# Patient Record
Sex: Female | Born: 1965 | Race: Black or African American | Hispanic: No | Marital: Married | Smoking: Never smoker
Health system: Southern US, Community
[De-identification: ages and names within clinical notes are randomized; demographics above are authoritative.]

## PROBLEM LIST (undated history)

## (undated) DIAGNOSIS — N926 Irregular menstruation, unspecified: Secondary | ICD-10-CM

## (undated) DIAGNOSIS — R0602 Shortness of breath: Secondary | ICD-10-CM

## (undated) DIAGNOSIS — M2550 Pain in unspecified joint: Secondary | ICD-10-CM

## (undated) DIAGNOSIS — K760 Fatty (change of) liver, not elsewhere classified: Secondary | ICD-10-CM

## (undated) DIAGNOSIS — M549 Dorsalgia, unspecified: Secondary | ICD-10-CM

## (undated) DIAGNOSIS — Z973 Presence of spectacles and contact lenses: Secondary | ICD-10-CM

## (undated) DIAGNOSIS — E119 Type 2 diabetes mellitus without complications: Secondary | ICD-10-CM

## (undated) DIAGNOSIS — Z86018 Personal history of other benign neoplasm: Secondary | ICD-10-CM

## (undated) DIAGNOSIS — I10 Essential (primary) hypertension: Secondary | ICD-10-CM

## (undated) DIAGNOSIS — R7303 Prediabetes: Secondary | ICD-10-CM

## (undated) DIAGNOSIS — E559 Vitamin D deficiency, unspecified: Secondary | ICD-10-CM

## (undated) DIAGNOSIS — R12 Heartburn: Secondary | ICD-10-CM

## (undated) DIAGNOSIS — G4730 Sleep apnea, unspecified: Secondary | ICD-10-CM

## (undated) DIAGNOSIS — R9389 Abnormal findings on diagnostic imaging of other specified body structures: Secondary | ICD-10-CM

## (undated) HISTORY — DX: Heartburn: R12

## (undated) HISTORY — PX: TONSILLECTOMY: SUR1361

## (undated) HISTORY — DX: Sleep apnea, unspecified: G47.30

## (undated) HISTORY — DX: Pain in unspecified joint: M25.50

## (undated) HISTORY — DX: Fatty (change of) liver, not elsewhere classified: K76.0

## (undated) HISTORY — DX: Shortness of breath: R06.02

## (undated) HISTORY — DX: Type 2 diabetes mellitus without complications: E11.9

## (undated) HISTORY — PX: CERVICAL CERCLAGE: SHX1329

## (undated) HISTORY — DX: Dorsalgia, unspecified: M54.9

---

## 1997-11-01 ENCOUNTER — Inpatient Hospital Stay (HOSPITAL_COMMUNITY): Admission: AD | Admit: 1997-11-01 | Discharge: 1997-11-01 | Payer: Self-pay | Admitting: Obstetrics and Gynecology

## 1998-03-24 ENCOUNTER — Other Ambulatory Visit: Admission: RE | Admit: 1998-03-24 | Discharge: 1998-03-24 | Payer: Self-pay | Admitting: Obstetrics & Gynecology

## 1998-05-06 ENCOUNTER — Ambulatory Visit (HOSPITAL_COMMUNITY): Admission: RE | Admit: 1998-05-06 | Discharge: 1998-05-06 | Payer: Self-pay | Admitting: Obstetrics and Gynecology

## 1998-05-06 ENCOUNTER — Encounter: Payer: Self-pay | Admitting: Obstetrics and Gynecology

## 1998-05-09 ENCOUNTER — Ambulatory Visit (HOSPITAL_COMMUNITY): Admission: RE | Admit: 1998-05-09 | Discharge: 1998-05-09 | Payer: Self-pay | Admitting: Obstetrics and Gynecology

## 1998-07-10 ENCOUNTER — Encounter: Payer: Self-pay | Admitting: Obstetrics and Gynecology

## 1998-07-10 ENCOUNTER — Ambulatory Visit (HOSPITAL_COMMUNITY): Admission: RE | Admit: 1998-07-10 | Discharge: 1998-07-10 | Payer: Self-pay | Admitting: Obstetrics and Gynecology

## 1998-09-10 ENCOUNTER — Inpatient Hospital Stay (HOSPITAL_COMMUNITY): Admission: AD | Admit: 1998-09-10 | Discharge: 1998-09-10 | Payer: Self-pay | Admitting: Obstetrics and Gynecology

## 1998-09-17 ENCOUNTER — Inpatient Hospital Stay (HOSPITAL_COMMUNITY): Admission: AD | Admit: 1998-09-17 | Discharge: 1998-09-17 | Payer: Self-pay | Admitting: Obstetrics and Gynecology

## 1998-09-25 ENCOUNTER — Encounter: Payer: Self-pay | Admitting: Obstetrics & Gynecology

## 1998-09-25 ENCOUNTER — Inpatient Hospital Stay (HOSPITAL_COMMUNITY): Admission: AD | Admit: 1998-09-25 | Discharge: 1998-09-25 | Payer: Self-pay | Admitting: Obstetrics & Gynecology

## 1998-10-12 ENCOUNTER — Inpatient Hospital Stay (HOSPITAL_COMMUNITY): Admission: AD | Admit: 1998-10-12 | Discharge: 1998-10-15 | Payer: Self-pay | Admitting: Obstetrics and Gynecology

## 1998-10-12 ENCOUNTER — Encounter (INDEPENDENT_AMBULATORY_CARE_PROVIDER_SITE_OTHER): Payer: Self-pay | Admitting: Specialist

## 1998-10-15 ENCOUNTER — Encounter (HOSPITAL_COMMUNITY): Admission: RE | Admit: 1998-10-15 | Discharge: 1999-01-13 | Payer: Self-pay | Admitting: Obstetrics and Gynecology

## 1998-12-16 ENCOUNTER — Encounter (HOSPITAL_COMMUNITY): Admission: RE | Admit: 1998-12-16 | Discharge: 1999-03-16 | Payer: Self-pay | Admitting: Obstetrics and Gynecology

## 1999-01-15 ENCOUNTER — Other Ambulatory Visit: Admission: RE | Admit: 1999-01-15 | Discharge: 1999-01-15 | Payer: Self-pay | Admitting: Obstetrics and Gynecology

## 1999-10-26 ENCOUNTER — Emergency Department (HOSPITAL_COMMUNITY): Admission: EM | Admit: 1999-10-26 | Discharge: 1999-10-26 | Payer: Self-pay | Admitting: Emergency Medicine

## 2000-08-08 ENCOUNTER — Encounter: Admission: RE | Admit: 2000-08-08 | Discharge: 2000-08-08 | Payer: Self-pay | Admitting: Emergency Medicine

## 2000-08-08 ENCOUNTER — Encounter: Payer: Self-pay | Admitting: Emergency Medicine

## 2001-03-01 ENCOUNTER — Encounter: Payer: Self-pay | Admitting: Obstetrics and Gynecology

## 2001-03-01 ENCOUNTER — Ambulatory Visit (HOSPITAL_COMMUNITY): Admission: RE | Admit: 2001-03-01 | Discharge: 2001-03-01 | Payer: Self-pay | Admitting: Obstetrics and Gynecology

## 2001-04-11 ENCOUNTER — Ambulatory Visit (HOSPITAL_COMMUNITY): Admission: RE | Admit: 2001-04-11 | Discharge: 2001-04-11 | Payer: Self-pay | Admitting: Obstetrics and Gynecology

## 2001-05-25 ENCOUNTER — Ambulatory Visit (HOSPITAL_COMMUNITY): Admission: RE | Admit: 2001-05-25 | Discharge: 2001-05-25 | Payer: Self-pay | Admitting: Obstetrics and Gynecology

## 2001-05-25 ENCOUNTER — Encounter: Payer: Self-pay | Admitting: Obstetrics and Gynecology

## 2001-08-02 ENCOUNTER — Encounter: Payer: Self-pay | Admitting: Obstetrics and Gynecology

## 2001-08-02 ENCOUNTER — Ambulatory Visit (HOSPITAL_COMMUNITY): Admission: RE | Admit: 2001-08-02 | Discharge: 2001-08-02 | Payer: Self-pay | Admitting: Obstetrics and Gynecology

## 2001-08-28 ENCOUNTER — Inpatient Hospital Stay (HOSPITAL_COMMUNITY): Admission: AD | Admit: 2001-08-28 | Discharge: 2001-08-28 | Payer: Self-pay | Admitting: Obstetrics and Gynecology

## 2001-08-31 ENCOUNTER — Inpatient Hospital Stay (HOSPITAL_COMMUNITY): Admission: AD | Admit: 2001-08-31 | Discharge: 2001-08-31 | Payer: Self-pay | Admitting: Obstetrics and Gynecology

## 2001-08-31 ENCOUNTER — Encounter: Payer: Self-pay | Admitting: Obstetrics and Gynecology

## 2001-09-05 ENCOUNTER — Encounter: Payer: Self-pay | Admitting: Obstetrics and Gynecology

## 2001-09-05 ENCOUNTER — Inpatient Hospital Stay (HOSPITAL_COMMUNITY): Admission: AD | Admit: 2001-09-05 | Discharge: 2001-09-05 | Payer: Self-pay | Admitting: Obstetrics and Gynecology

## 2001-09-07 ENCOUNTER — Inpatient Hospital Stay (HOSPITAL_COMMUNITY): Admission: AD | Admit: 2001-09-07 | Discharge: 2001-09-09 | Payer: Self-pay | Admitting: Obstetrics and Gynecology

## 2001-09-10 ENCOUNTER — Encounter: Admission: RE | Admit: 2001-09-10 | Discharge: 2001-10-10 | Payer: Self-pay | Admitting: Obstetrics and Gynecology

## 2001-09-13 ENCOUNTER — Inpatient Hospital Stay (HOSPITAL_COMMUNITY): Admission: AD | Admit: 2001-09-13 | Discharge: 2001-09-13 | Payer: Self-pay | Admitting: Obstetrics and Gynecology

## 2001-10-11 ENCOUNTER — Encounter: Admission: RE | Admit: 2001-10-11 | Discharge: 2001-11-10 | Payer: Self-pay | Admitting: Obstetrics and Gynecology

## 2001-12-12 ENCOUNTER — Other Ambulatory Visit: Admission: RE | Admit: 2001-12-12 | Discharge: 2001-12-12 | Payer: Self-pay | Admitting: Obstetrics and Gynecology

## 2003-03-25 ENCOUNTER — Other Ambulatory Visit: Admission: RE | Admit: 2003-03-25 | Discharge: 2003-03-25 | Payer: Self-pay | Admitting: Obstetrics and Gynecology

## 2003-04-11 ENCOUNTER — Ambulatory Visit (HOSPITAL_COMMUNITY): Admission: RE | Admit: 2003-04-11 | Discharge: 2003-04-11 | Payer: Self-pay | Admitting: Obstetrics and Gynecology

## 2003-04-18 ENCOUNTER — Encounter: Admission: RE | Admit: 2003-04-18 | Discharge: 2003-04-18 | Payer: Self-pay | Admitting: Obstetrics and Gynecology

## 2004-07-30 ENCOUNTER — Encounter: Admission: RE | Admit: 2004-07-30 | Discharge: 2004-07-30 | Payer: Self-pay | Admitting: Emergency Medicine

## 2004-11-04 ENCOUNTER — Other Ambulatory Visit: Admission: RE | Admit: 2004-11-04 | Discharge: 2004-11-04 | Payer: Self-pay | Admitting: Obstetrics and Gynecology

## 2005-06-02 ENCOUNTER — Ambulatory Visit (HOSPITAL_COMMUNITY): Admission: RE | Admit: 2005-06-02 | Discharge: 2005-06-02 | Payer: Self-pay | Admitting: General Surgery

## 2005-06-23 ENCOUNTER — Encounter: Admission: RE | Admit: 2005-06-23 | Discharge: 2005-06-23 | Payer: Self-pay | Admitting: General Surgery

## 2005-11-09 ENCOUNTER — Other Ambulatory Visit: Admission: RE | Admit: 2005-11-09 | Discharge: 2005-11-09 | Payer: Self-pay | Admitting: Obstetrics and Gynecology

## 2006-12-20 ENCOUNTER — Encounter: Admission: RE | Admit: 2006-12-20 | Discharge: 2006-12-20 | Payer: Self-pay | Admitting: Obstetrics and Gynecology

## 2009-03-20 ENCOUNTER — Encounter: Admission: RE | Admit: 2009-03-20 | Discharge: 2009-03-20 | Payer: Self-pay | Admitting: Internal Medicine

## 2010-02-01 ENCOUNTER — Encounter: Payer: Self-pay | Admitting: General Surgery

## 2010-04-01 ENCOUNTER — Ambulatory Visit (HOSPITAL_COMMUNITY)
Admission: RE | Admit: 2010-04-01 | Discharge: 2010-04-01 | Disposition: A | Discharge status: Home / Self Care | Payer: 59 | Source: Ambulatory Visit | Attending: Internal Medicine | Admitting: Internal Medicine

## 2010-04-01 ENCOUNTER — Other Ambulatory Visit: Payer: Self-pay | Admitting: Internal Medicine

## 2010-04-01 DIAGNOSIS — R52 Pain, unspecified: Secondary | ICD-10-CM

## 2010-04-01 DIAGNOSIS — R072 Precordial pain: Secondary | ICD-10-CM | POA: Insufficient documentation

## 2010-04-01 DIAGNOSIS — I10 Essential (primary) hypertension: Secondary | ICD-10-CM | POA: Insufficient documentation

## 2010-04-01 HISTORY — PX: TRANSTHORACIC ECHOCARDIOGRAM: SHX275

## 2010-05-29 NOTE — Op Note (Signed)
[facility]  PatientFAYOLA, MECKES Visit Number: [id number] MRN: [id number]          Service Type: Attending:  Maris Berger. Pennie Rushing, M.D. Dictated by:   Maris Berger. Pennie Rushing, M.D. Proc. Date: 04/11/01                             Operative Report  PREOPERATIVE DIAGNOSES:       Intrauterine pregnancy at 15 weeks, incompetent cervix.  POSTOPERATIVE DIAGNOSES:      Intrauterine pregnancy at 15 weeks, incompetent cervix.  OPERATION:                    Cervical cerclage.  ANESTHESIA:                   Spinal.  ESTIMATED BLOOD LOSS:         Less than 10 cc.  COMPLICATIONS:                None.  FINDINGS:                     The cervix was approximately 2.5-3 cm Cregger.  The external os was opened approximately 1 cm.  The internal os was at maximum a fingertip open.  PROCEDURE:                    The patient was taken to the operating room after appropriate identification and after a discussion of the indications, risks, and benefits of the procedure.  She was placed on the operating table and a spinal anesthetic was placed.  She was placed in the supine position with a left lateral tilt.  The perineum and vagina were prepped with multiple layers of Betadine, then draped as a sterile field.  A Red Robinson catheter was used to empty the bladder.  A weighted speculum was placed in the posterior vagina and ______ forceps placed on the anterior and posterior surfaces of the cervix.  The level of the internal os was identified.  Using a 5 mm Mersilene suture the cervix was then circumscribed in a purse string suture placed at the level of the internal os and tied anteriorly.  In tying the cervix was left at approximately a fingertip dilatation.  Hemostasis was noted to be adequate.  All instruments were removed from the vagina and the patient taken from the operating room to the recovery room in satisfactory condition having tolerated the procedure well  with sponge and instrument counts correct. Dictated by:   Maris Berger. Pennie Rushing, M.D. Attending:  Maris Berger. Pennie Rushing, M.D. DD:  04/11/01 TD:  04/12/01 Job: [id number]

## 2010-05-29 NOTE — H&P (Signed)
NAME:  Mary Odonnell, Mary Odonnell                           ACCOUNT NO.:  [id number]   MEDICAL RECORD NO.:  [id number]                   PATIENT TYPE:  MAT   LOCATION:                                       FACILITY:  [facility]   PHYSICIAN:  [id number]                                DATE OF BIRTH:  [birth year]   DATE OF ADMISSION:  09/07/2001  DATE OF DISCHARGE:                                HISTORY & PHYSICAL   HISTORY OF PRESENT ILLNESS:  The patient is a 45 year old gravida 3 para 1-0-  1-1 at 39 and six-sevenths weeks who presents for blood pressure check and  reevaluation secondary to hypertension and a cervical cerclage.  The patient  is complaining of a headache and mild blurry vision.  Pregnancy has been  remarkable for:  1. Chronic hypertension with the patient on Aldomet 500 mg t.i.d.  2. Incompetent cervix with cerclage in place.  3. Advanced maternal age with the patient declining amnio and AFP.  4. History of second-trimester SAB.  5. Fibroids.  6. History of abnormal Pap.  7. One abnormal value on three-hour GTT with normal fasting blood sugar and     two-hour PC last week.   PRENATAL LABORATORY DATA:  Blood type O positive, Rh antibody negative.  VDRL nonreactive.  Rubella titer positive.  Hepatitis B surface antigen  negative.  HIV nonreactive.  GC and chlamydia cultures negative.  Pap was  normal in November 2002.  Hemoglobin upon entering  the practice is 13.1; it  is 11.8 at 27 weeks.  EDC of October 06, 2001 was established by eight-  week ultrasound and was in agreement with ultrasound at approximately 18  weeks.  Group B strep culture has not yet been done.   HISTORY OF PRESENT PREGNANCY:  The patient entered care at approximately  nine weeks.  She was on Aldomet 250 mg b.i.d. at the beginning of her  pregnancy.  She had had a previous cerclage and was evaluated again for  cerclage.  Cerclage was placed on April 1.  At 15 weeks her cervix was Berhe  and closed and she was  continued on bedrest until April 14.  The patient and  her husband declined amnio and AFP.  The patient was placed on Zantac at 18  weeks for gastroesophageal reflux disease.  She was on Motrin during her  pregnancy in the early stages for cramping.  Aldomet was upped to 250 mg  t.i.d. at 22 weeks.  She had a Glucola that was done prior to checking her  urine.  At that time her urine was noted to have 3+ Glucose.  She  subsequently had a three-hour GTT.  She had one abnormal value.  The one-  hour value was 193.  She had a two-hour PC scheduled at 79,  36, and 40  weeks.  She had an ultrasound at 30 weeks that showed normal growth in the  50th to the 75th percentile.  Cervix was 2.4 cm with a cerclage in place.  Cervix was closed, 70%, vertex at a -2.  Cervix was checked at 32 weeks and  was closed, 70%, vertex at a -1, ballotable.  She had a normal blood sugar  at 33 weeks.  Aldomet was upped to 500 mg t.i.d. at 34 weeks.  She had PIH  labs done on August 18 and NSTs were begun twice weekly.  She has had a  tendency to have nonreactive NSTs over the last week.  The decision was made  to reevaluate her today for possible admission for induction secondary to  chronic hypertension.   OBSTETRICAL HISTORY:  In 1999 the patient had a 15-16 week spontaneous  miscarriage.  In 2000 she had the vaginal birth of a female infant, weight 6  pounds 11 ounces at [redacted] weeks gestation.  She was in labor 17 hours.  That  was a vacuum-assisted vaginal birth.  She had epidural anesthesia.  She did  have a cerclage placed during that pregnancy and remained on bedrest, and  cerclage was removed at approximately 36 weeks.   MEDICAL HISTORY:  She was on oral contraceptives in the past.  She had a  history of an abnormal Pap and a colposcopy with dysplasia.  She was  diagnosed with fibroids in 1998.  She was treated for chlamydia in the past.  She has occasional yeast infections.  She reports the usual childhood   illnesses.  She was diagnosed in 1994 with chronic hypertension and was on  Aldomet.  The patient has history of migraines but is not on any routine  medication.   SURGICAL HISTORY:  Includes a cerclage in 2000.  She had the spontaneous  miscarriage in October 1999.  She reports tonsillectomy and adenoidectomy as  a child.   ALLERGIES:  No known medication allergies.   GENETIC HISTORY:  Remarkable for the patient being 36 at her due date.  The  father of the baby has one parent with trait, and the father of the baby has  family history of twins.  Maternal grandmother has heart disease.  Maternal  grandmother also has chronic hypertension.  Maternal great-grandmother has  chronic hypertension and maternal grandmother has emphysema.   SOCIAL HISTORY:  The patient is married to the father of the baby.  He is  involved and supportive.  His name is Evee Liska.  The patient is [place]  American, of the [employer].  She is high-school educated.  She is  employed as an Systems developer.  Her husband is also high-school educated with some  Scientist, product/process development school and is employed also as an Systems developer.  She has been followed  by the physician service at [employer].  She denies any alcohol,  drug, or tobacco use during this pregnancy.   PHYSICAL EXAMINATION:  VITAL SIGNS:  Blood pressures 141 to 140 systolic  over 80-98 diastolic with a large cuff on the upper arm.  Other vital signs  are stable.  HEENT:  Within normal limits.  LUNGS:  Bilateral breath sounds are clear.  HEART:  Regular rate and rhythm without murmur.  BREASTS:  Soft and nontender.  ABDOMEN:  Fundal height is approximately 35 cm.  Uterine contractions are  irregular and mild.  Fetal heart rate baseline is in the 155s with sporadic  variables.  Deep tendon reflexes are 2+ without clonus.  There is trace to  1+ edema noted in the lower extremities.   IMPRESSION: 1. Intrauterine pregnancy at 35 and six-sevenths weeks.  2. Chronic  hypertension.  3. Nonreactive fetal heart rate.  4. Cerclage.   PLAN:  1. Admit to birthing suite per consult with Dr. Dierdre Forth as attending     physician.  2. Routine physician orders.  3. Dr. Pennie Rushing will plan to come remove the cerclage and determine method of     induction.  4. Clean catch urine will be done for protein evaluation.     Mary Odonnell, C.N.M.                   [id number]    VLL/MEDQ  D:  09/07/2001  T:  09/07/2001  Job:  [id number]

## 2011-11-23 ENCOUNTER — Other Ambulatory Visit: Payer: Self-pay | Admitting: Obstetrics and Gynecology

## 2011-11-23 DIAGNOSIS — Z1231 Encounter for screening mammogram for malignant neoplasm of breast: Secondary | ICD-10-CM

## 2011-12-04 ENCOUNTER — Emergency Department (HOSPITAL_COMMUNITY)
Admission: EM | Admit: 2011-12-04 | Discharge: 2011-12-04 | Disposition: A | Discharge status: Home / Self Care | Payer: 59 | Source: Home / Self Care | Attending: Family Medicine | Admitting: Family Medicine

## 2011-12-04 ENCOUNTER — Encounter (HOSPITAL_COMMUNITY): Payer: Self-pay | Admitting: Emergency Medicine

## 2011-12-04 DIAGNOSIS — R071 Chest pain on breathing: Secondary | ICD-10-CM

## 2011-12-04 DIAGNOSIS — G8929 Other chronic pain: Secondary | ICD-10-CM

## 2011-12-04 DIAGNOSIS — R0789 Other chest pain: Secondary | ICD-10-CM

## 2011-12-04 HISTORY — DX: Essential (primary) hypertension: I10

## 2011-12-04 HISTORY — DX: Vitamin D deficiency, unspecified: E55.9

## 2011-12-04 MED ORDER — DICLOFENAC POTASSIUM 50 MG PO TABS: 50.0000 mg | ORAL_TABLET | Freq: Three times a day (TID) | ORAL | Status: DC

## 2011-12-04 NOTE — ED Notes (Signed)
Reports chest pain radiating to back and bilateral shoulders since Tuesday.   Patient did not take any medications.

## 2011-12-04 NOTE — ED Provider Notes (Signed)
History     CSN: [id number]  Arrival date & time 12/04/11  1517   First MD Initiated Contact with Patient 12/04/11 1652      Chief Complaint  Patient presents with  . Chest Pain    (Consider location/radiation/quality/duration/timing/severity/associated sxs/prior treatment) Patient is a 46 y.o. female presenting with chest pain. The history is provided by the patient.  Chest Pain The chest pain began 2 days ago. The chest pain is unchanged. The severity of the pain is mild. The quality of the pain is described as sharp and similar to previous episodes. Chest pain is worsened by certain positions. Pertinent negatives for primary symptoms include no palpitations.     Past Medical History  Diagnosis Date  . Hypertension   . Vitamin D deficiency     Past Surgical History  Procedure Date  . Tonsillectomy     History reviewed. No pertinent family history.  History  Substance Use Topics  . Smoking status: Never Smoker   . Smokeless tobacco: Not on file  . Alcohol Use: No    OB History    Grav Para Term Preterm Abortions TAB SAB Ect Mult Living                  Review of Systems  Constitutional: Negative.   HENT: Negative.   Respiratory: Negative.   Cardiovascular: Positive for chest pain. Negative for palpitations.  Gastrointestinal: Negative.     Allergies  Review of patient's allergies indicates no known allergies.  Home Medications   Current Outpatient Rx  Name  Route  Sig  Dispense  Refill  . AMLODIPINE BESYLATE 10 MG PO TABS   Oral   Take 10 mg by mouth daily.         Marland Kitchen VITAMIN D3 5000 UNITS PO TABS   Oral   Take by mouth.         Arnette Schaumann F PO   Oral   Take by mouth. One capsule three times a week         . FUROSEMIDE 20 MG PO TABS   Oral   Take 20 mg by mouth 2 (two) times daily.         Marland Kitchen LOSARTAN POTASSIUM 100 MG PO TABS   Oral   Take 100 mg by mouth daily.         Marland Kitchen DICLOFENAC POTASSIUM 50 MG PO TABS   Oral   Take 1  tablet (50 mg total) by mouth 3 (three) times daily. As needed for chest soreness.   30 tablet   0     BP 143/84  Pulse 93  Temp 98.8 F (37.1 C) (Oral)  Resp 20  SpO2 100%  Physical Exam  Nursing note and vitals reviewed. Constitutional: She is oriented to person, place, and time. She appears well-developed and well-nourished.  HENT:  Head: Normocephalic.  Eyes: Pupils are equal, round, and reactive to light.  Neck: Normal range of motion. Neck supple.  Cardiovascular: Normal rate, normal heart sounds and intact distal pulses.   Pulmonary/Chest: Effort normal and breath sounds normal. She exhibits tenderness. She exhibits no mass, no bony tenderness and no swelling.  Abdominal: Soft. Bowel sounds are normal. There is no tenderness.  Lymphadenopathy:    She has no cervical adenopathy.  Neurological: She is alert and oriented to person, place, and time.  Skin: Skin is warm and dry.    ED Course  Procedures (including critical care time)  Labs Reviewed - No  data to display No results found.   1. Chronic chest wall pain       MDM          Linna Hoff, MD 12/04/11 [phone]

## 2011-12-13 ENCOUNTER — Ambulatory Visit (INDEPENDENT_AMBULATORY_CARE_PROVIDER_SITE_OTHER): Payer: 59 | Admitting: Obstetrics and Gynecology

## 2011-12-13 ENCOUNTER — Encounter: Payer: Self-pay | Admitting: Obstetrics and Gynecology

## 2011-12-13 VITALS — BP 110/62 | Resp 18 | Ht 58.5 in | Wt 238.0 lb

## 2011-12-13 DIAGNOSIS — Z124 Encounter for screening for malignant neoplasm of cervix: Secondary | ICD-10-CM

## 2011-12-13 DIAGNOSIS — I10 Essential (primary) hypertension: Secondary | ICD-10-CM

## 2011-12-13 DIAGNOSIS — D259 Leiomyoma of uterus, unspecified: Secondary | ICD-10-CM

## 2011-12-13 DIAGNOSIS — D649 Anemia, unspecified: Secondary | ICD-10-CM

## 2011-12-13 DIAGNOSIS — D219 Benign neoplasm of connective and other soft tissue, unspecified: Secondary | ICD-10-CM | POA: Insufficient documentation

## 2011-12-13 NOTE — Progress Notes (Signed)
Subjective:    Mary Odonnell is a 46 y.o. female, [id number], who presents for an annual exam. Pt c/o R side low abdominal pain & low back pain during menses.  Last Pap: 11/14/06 WNL: Yes Regular Periods:yes Contraception: Hus vas  Monthly Breast exam:yes Tetanus<13yrs:yes Nl.Bladder Function:yes Daily BMs:yes Healthy Diet:yes Calcium:no Mammogram:yes Date of Mammogram: 03/20/09 Exercise:yes Have often Exercise: 2-3x's wkly Seatbelt: yes Abuse at home: no Stressful work:yes Sigmoid-colonoscopy: none Bone Density: No PCP: Mary Odonnell  Change in PMH: none Change in [facility]: mother - hypertension 2012         History   Social History  . Marital Status: Married    Spouse Name: N/A    Number of Children: N/A  . Years of Education: N/A   Social History Main Topics  . Smoking status: Never Smoker   . Smokeless tobacco: Never Used  . Alcohol Use: 0.5 oz/week    1 drink(s) per week  . Drug Use: No  . Sexually Active: Yes -- Female partner(s)    Birth Control/ Protection: Surgical, None     Comment: hus vas    Other Topics Concern  . None   Social History Narrative  . None    Menstrual cycle:   LMP: Patient's last menstrual period was 11/19/2011.           Cycle: Reg monthly lasting 7 days, second day is very heavy, last 2 days are very light.  Back pain a few days prior to menses. No IM bleeding.  The following portions of the patient's history were reviewed and updated as appropriate: allergies, current medications, past family history, past medical history, past social history, past surgical history and problem list. Had episode of chest pain dx'd as costochondritis. Chest pain radiated to nipples.  Has mammogram scheduled for 2 weeks.No familily hx of breast disease. On Cataflam for a few more days only.  Has anemia with occassional light headedness. She has a Leavey history of fibroids, though the size was not thought to be consistent with the amount of back pain that  she is having.  Review of Systems Pertinent items are noted in HPI. Breast:Negative for breast lump,nipple discharge or nipple retraction Gastrointestinal: Negative for abdominal pain, change in bowel habits or rectal bleeding Urinary:negative   Objective:    BP 110/62  Resp 18  Ht 4' 10.5" (1.486 m)  Wt 238 lb (107.956 kg)  BMI 48.90 kg/m2  LMP 11/19/2011    Weight:  Wt Readings from Last 1 Encounters:  12/13/11 238 lb (107.956 kg)          BMI: Body mass index is 48.90 kg/(m^2).  General Appearance: Alert, appropriate appearance for age. No acute distress HEENT: Grossly normal Neck / Thyroid: Supple, no masses, nodes or enlargement Lungs: clear to auscultation bilaterally Back: No CVA tenderness Breast Exam: No masses or nodes.No dimpling, nipple retraction or discharge. Cardiovascular: Regular rate and rhythm. S1, S2, no murmur Gastrointestinal: Soft, non-tender, no masses or organomegaly Pelvic Exam: External genitalia: normal general appearance Vaginal: normal rugae Cervix: normal appearance Adnexa: non palpable Uterus: Cannot feel the limits of the uterus secondary to body habitus Exam limited by body habitus Rectovaginal: normal rectal, no masses Lymphatic Exam: Non-palpable nodes in neck, clavicular, axillary, or inguinal regions Skin: no rash or abnormalities Neurologic: Normal gait and speech, no tremor  Psychiatric: Alert and oriented, appropriate affect.   Wet Prep:not applicable Urinalysis:not applicable UPT: Not done   Assessment:    Known history  of fibroid  Complaint of pelvic pain Probable menorrhagia Chronic anemia   Plan:    mammogram pap smear Return to office for ultrasound and followup STD screening: declined Contraception:vasectomy   Mary Forth MD

## 2011-12-15 LAB — PAP IG AND HPV HIGH-RISK
HPV, high-risk: NEGATIVE
PAP Smear Comment: 0

## 2011-12-31 ENCOUNTER — Ambulatory Visit
Admission: RE | Admit: 2011-12-31 | Discharge: 2011-12-31 | Disposition: A | Discharge status: Home / Self Care | Payer: 59 | Source: Ambulatory Visit | Attending: Obstetrics and Gynecology | Admitting: Obstetrics and Gynecology

## 2011-12-31 DIAGNOSIS — Z1231 Encounter for screening mammogram for malignant neoplasm of breast: Secondary | ICD-10-CM

## 2012-01-19 ENCOUNTER — Other Ambulatory Visit: Payer: Self-pay

## 2012-01-19 DIAGNOSIS — R102 Pelvic and perineal pain: Secondary | ICD-10-CM

## 2012-01-20 ENCOUNTER — Ambulatory Visit (INDEPENDENT_AMBULATORY_CARE_PROVIDER_SITE_OTHER): Payer: 59 | Admitting: Obstetrics and Gynecology

## 2012-01-20 ENCOUNTER — Encounter: Payer: Self-pay | Admitting: Obstetrics and Gynecology

## 2012-01-20 ENCOUNTER — Ambulatory Visit (INDEPENDENT_AMBULATORY_CARE_PROVIDER_SITE_OTHER): Payer: 59

## 2012-01-20 VITALS — BP 134/88 | Temp 98.8°F | Wt 235.5 lb

## 2012-01-20 DIAGNOSIS — R102 Pelvic and perineal pain: Secondary | ICD-10-CM

## 2012-01-20 DIAGNOSIS — D649 Anemia, unspecified: Secondary | ICD-10-CM | POA: Insufficient documentation

## 2012-01-20 DIAGNOSIS — N946 Dysmenorrhea, unspecified: Secondary | ICD-10-CM

## 2012-01-20 DIAGNOSIS — D259 Leiomyoma of uterus, unspecified: Secondary | ICD-10-CM

## 2012-01-20 DIAGNOSIS — D759 Disease of blood and blood-forming organs, unspecified: Secondary | ICD-10-CM

## 2012-01-20 DIAGNOSIS — D219 Benign neoplasm of connective and other soft tissue, unspecified: Secondary | ICD-10-CM

## 2012-01-20 NOTE — Progress Notes (Signed)
GYN PROBLEM VISIT  Mary Odonnell is a 47 y.o. year old female,[id number], who presents for followup  Subjective:  Pt here to follow up for dysmenorrhea. Has back pain during menses, not at other times of the month. It is usually relieved by over-the-counter medication.  Objective:  BP 134/88  Temp 98.8 F (37.1 C)  Wt 235 lb 8 oz (106.822 kg)  LMP 01/16/2012   ULTRASOUND: Uterus: Length: 5.87 cm   Width:  4.97 cm   Height:  3.74 cm  Endo thickness:  0.162 cm   Left ovary:Normal Right ovary:Normal Fibroids:yes  Number:  1 subserosal fibroid is visualized in the left fundus. Measures:1.9cm x 2.1cm x 1.8cm   CDS fluid:no  :Comment: Several Nabothian cysts are seen. Largest measures: 1.4cm x 1.1cm x 1.4cm. Endometrium is thin. WNL's. Adnexa-WNL's.   Assessment: Mild dysmenorrhea Small single visualized uterine fibroid at 2.1 cm Cannot rule out endometriosis as an etiology of dysmenorrhea Anemia without significant menorrhagia  Plan: Reviewed findings with the patient and asked whether she would be interested in moving to the next level up evaluation of her dysmenorrhea, specifically, a laparoscopy. She declines laparoscopy at this time. The patient will continue to use over-the-counter medications for relief of dysmenorrhea She will sign a release of information to have her laboratory studies forwarded to [place]. If she continues with anemia in spite of her iron therapy, she will be referred for workup Return to office for annual examination 12/30/2012 or as needed  Dierdre Forth, MD  01/20/2012 10:34 AM

## 2012-02-26 ENCOUNTER — Other Ambulatory Visit: Payer: Self-pay

## 2012-11-16 ENCOUNTER — Other Ambulatory Visit: Payer: Self-pay

## 2012-12-26 ENCOUNTER — Other Ambulatory Visit: Payer: Self-pay | Admitting: Obstetrics and Gynecology

## 2012-12-26 DIAGNOSIS — Z1231 Encounter for screening mammogram for malignant neoplasm of breast: Secondary | ICD-10-CM

## 2013-01-12 ENCOUNTER — Ambulatory Visit (HOSPITAL_COMMUNITY)
Admission: RE | Admit: 2013-01-12 | Discharge: 2013-01-12 | Disposition: A | Discharge status: Home / Self Care | Payer: 59 | Source: Ambulatory Visit | Attending: Obstetrics and Gynecology | Admitting: Obstetrics and Gynecology

## 2013-01-12 DIAGNOSIS — Z1231 Encounter for screening mammogram for malignant neoplasm of breast: Secondary | ICD-10-CM | POA: Insufficient documentation

## 2013-11-12 ENCOUNTER — Encounter: Payer: Self-pay | Admitting: Obstetrics and Gynecology

## 2013-12-17 ENCOUNTER — Other Ambulatory Visit: Payer: Self-pay

## 2013-12-18 LAB — CBC WITH DIFFERENTIAL
Basophils Absolute: 0 10*3/uL (ref 0.0–0.2)
Basos: 0 %
Eos: 1 %
Eosinophils Absolute: 0.1 10*3/uL (ref 0.0–0.4)
HCT: 39.3 % (ref 34.0–46.6)
Hemoglobin: 12.7 g/dL (ref 11.1–15.9)
Immature Grans (Abs): 0 10*3/uL (ref 0.0–0.1)
Immature Granulocytes: 0 %
Lymphocytes Absolute: 3 10*3/uL (ref 0.7–3.1)
Lymphs: 54 %
MCH: 27 pg (ref 26.6–33.0)
MCHC: 32.3 g/dL (ref 31.5–35.7)
MCV: 83 fL (ref 79–97)
Monocytes Absolute: 0.4 10*3/uL (ref 0.1–0.9)
Monocytes: 7 %
Neutrophils Absolute: 2.2 10*3/uL (ref 1.4–7.0)
Neutrophils Relative %: 38 %
Platelets: 324 10*3/uL (ref 150–379)
RBC: 4.71 x10E6/uL (ref 3.77–5.28)
RDW: 15 % (ref 12.3–15.4)
WBC: 5.7 10*3/uL (ref 3.4–10.8)

## 2013-12-18 LAB — HIV ANTIBODY (ROUTINE TESTING W REFLEX)
HIV 1/O/2 Abs-Index Value: <1 (ref <1.00)
HIV-1/HIV-2 Ab: NONREACTIVE

## 2014-02-05 ENCOUNTER — Other Ambulatory Visit: Payer: Self-pay

## 2014-02-05 DIAGNOSIS — Z1231 Encounter for screening mammogram for malignant neoplasm of breast: Secondary | ICD-10-CM

## 2014-02-14 ENCOUNTER — Ambulatory Visit
Admission: RE | Admit: 2014-02-14 | Discharge: 2014-02-14 | Disposition: A | Discharge status: Home / Self Care | Payer: 59 | Source: Ambulatory Visit

## 2014-02-14 DIAGNOSIS — Z1231 Encounter for screening mammogram for malignant neoplasm of breast: Secondary | ICD-10-CM

## 2016-03-16 ENCOUNTER — Other Ambulatory Visit: Payer: Self-pay | Admitting: Obstetrics and Gynecology

## 2016-03-19 ENCOUNTER — Other Ambulatory Visit: Payer: Self-pay | Admitting: Obstetrics and Gynecology

## 2016-03-23 ENCOUNTER — Encounter (HOSPITAL_BASED_OUTPATIENT_CLINIC_OR_DEPARTMENT_OTHER): Payer: Self-pay | Admitting: *Deleted

## 2016-03-23 NOTE — Progress Notes (Signed)
NPO AFTER MN.  ARRIVE  AT 1015.  NEEDS ISTAT 8, URINE PREG. AND EKG.

## 2016-03-29 NOTE — H&P (Signed)
Mary Odonnell is an 51 y.o. female. Presents for further evaluation and management of abnormal uterine bleeding which alternates between oligomenorrhea to prolonged menses, sometimes heavy.  Pertinent Gynecological History: Menses: irregular occurring approximately every 30-90 days with spotting approximately upto 20 days per month Bleeding: intermenstrual bleeding Contraception: vasectomy DES exposure: unknown Blood transfusions: none Sexually transmitted diseases: past history: chlamydia Previous GYN Procedures: cerclage 2003  Last mammogram: normal Date: 02/18/16 Last pap: normal Date: 12/2015 OB History: G3, P2   Menstrual History: Menarche age: 62 Patient's last menstrual period was 01/24/2016 (approximate).  She has had some bleeding since her endometrial bx on 03/25/16    Past Medical History:  Diagnosis Date  . Abnormal finding on imaging   . History of uterine fibroid   . Hypertension   . Menstrual periods irregular   . Pre-diabetes   . Vitamin D deficiency   . Wears glasses     Past Surgical History:  Procedure Laterality Date  . CERVICAL CERCLAGE  04/ 01/ 2003  and 2000  . TONSILLECTOMY  age 76  . TRANSTHORACIC ECHOCARDIOGRAM  04/01/2010   mild LVH, ef 65-70%    Family History  Problem Relation Age of Onset  . Hypertension Mother   . Emphysema Maternal Grandfather     Social History:  reports that she has never smoked. She has never used smokeless tobacco. She reports that she drinks about 0.5 oz of alcohol per week . She reports that she does not use drugs.  Allergies: No Known Allergies  Prescriptions Prior to Admission  Medication Sig Dispense Refill Last Dose  . acetaminophen (TYLENOL) 500 MG tablet Take 500 mg by mouth every 6 (six) hours as needed.   Past Week at Unknown time  . amLODipine (NORVASC) 10 MG tablet Take 10 mg by mouth every evening.    03/29/2016 at Unknown time  . cetirizine (ZYRTEC) 10 MG tablet Take 10 mg by mouth daily as needed.     03/29/2016 at Unknown time  . Cholecalciferol (VITAMIN D-3) 1000 units CAPS Take 1 capsule by mouth daily.   Past Month at Unknown time  . furosemide (LASIX) 20 MG tablet Take 20 mg by mouth every morning.    03/29/2016 at Unknown time  . losartan (COZAAR) 100 MG tablet Take 100 mg by mouth every evening.    03/29/2016 at Unknown time  . metFORMIN (GLUCOPHAGE-XR) 750 MG 24 hr tablet Take 750 mg by mouth every evening.   03/29/2016 at Unknown time    Review of Systems  Constitutional: Negative.   HENT: Negative.   Eyes: Negative.   Respiratory: Negative.   Cardiovascular: Negative.   Gastrointestinal: Negative.   Genitourinary: Negative.   Musculoskeletal: Negative.   Skin: Negative.   Neurological: Negative.   Endo/Heme/Allergies: Negative.   Psychiatric/Behavioral: Negative.     Blood pressure (!) 154/82, pulse 81, temperature 98.9 F (37.2 C), temperature source Oral, resp. rate 16, height 4\' 11"  (1.499 m), weight 230 lb (104.3 kg), last menstrual period 01/24/2016, SpO2 99 %. Physical Exam  Constitutional: She is oriented to person, place, and time. She appears well-developed and well-nourished.  Severe obesity  HENT:  Head: Normocephalic and atraumatic.  Eyes: EOM are normal.  Neck: Normal range of motion. Neck supple.  Cardiovascular: Normal rate and regular rhythm.   Respiratory: Effort normal and breath sounds normal.  GI: Soft. Bowel sounds are normal.  Genitourinary:  Genitourinary Comments: Pelvic exam:  VULVA: normal appearing vulva with no masses, tenderness or lesions,  VAGINA: normal appearing vagina with normal color and discharge, no lesions,  CERVIX: normal appearing cervix without discharge or lesions,  UTERUS: uterus is normal size, shape, consistency and nonte , ADNEXA: normal adnexa in size, nontender and no masses.   Musculoskeletal: Normal range of motion.  Neurological: She is alert and oriented to person, place, and time.  Skin: Skin is warm and dry.   Psychiatric: She has a normal mood and affect.  Endometrial biopsy=nl  Results for orders placed or performed during the hospital encounter of 03/30/16 (from the past 24 hour(s))  I-STAT, chem 8     Status: None   Collection Time: 03/30/16 10:41 AM  Result Value Ref Range   Sodium 141 135 - 145 mmol/L   Potassium 3.7 3.5 - 5.1 mmol/L   Chloride 106 101 - 111 mmol/L   BUN 12 6 - 20 mg/dL   Creatinine, Ser 0.70 0.44 - 1.00 mg/dL   Glucose, Bld 96 65 - 99 mg/dL   Calcium, Ion 1.15 1.15 - 1.40 mmol/L   TCO2 26 0 - 100 mmol/L   Hemoglobin 13.9 12.0 - 15.0 g/dL   HCT 41.0 36.0 - 46.0 %  Pregnancy, urine POC     Status: None   Collection Time: 03/30/16 10:49 AM  Result Value Ref Range   Preg Test, Ur NEGATIVE NEGATIVE    No results found.  Assessment/Plan: Irregular menses Hysterscopy D&C recommended and accepted.  Pt also offered endometrial ablation to address irreg menses.  Risks of anesthesia, bleeding, infection, and damage to adjacent organs including uterine perforation all explained.  Pt understands that if perforation occurs, the procedure will need to be stopped immediately.  She wishes to proceed.  Nicasio Barlowe P 03/30/2016, 11:02 AM

## 2016-03-30 ENCOUNTER — Encounter (HOSPITAL_BASED_OUTPATIENT_CLINIC_OR_DEPARTMENT_OTHER): Payer: Self-pay | Admitting: *Deleted

## 2016-03-30 ENCOUNTER — Ambulatory Visit (HOSPITAL_BASED_OUTPATIENT_CLINIC_OR_DEPARTMENT_OTHER)
Admission: RE | Admit: 2016-03-30 | Discharge: 2016-03-30 | Disposition: A | Discharge status: Home / Self Care | Payer: 59 | Source: Ambulatory Visit | Attending: Obstetrics and Gynecology | Admitting: Obstetrics and Gynecology

## 2016-03-30 ENCOUNTER — Ambulatory Visit (HOSPITAL_BASED_OUTPATIENT_CLINIC_OR_DEPARTMENT_OTHER): Payer: 59 | Admitting: Anesthesiology

## 2016-03-30 ENCOUNTER — Other Ambulatory Visit: Payer: Self-pay

## 2016-03-30 ENCOUNTER — Encounter (HOSPITAL_BASED_OUTPATIENT_CLINIC_OR_DEPARTMENT_OTHER)
Admission: RE | Disposition: A | Discharge status: Home / Self Care | Payer: Self-pay | Source: Ambulatory Visit | Attending: Obstetrics and Gynecology

## 2016-03-30 DIAGNOSIS — N921 Excessive and frequent menstruation with irregular cycle: Secondary | ICD-10-CM | POA: Insufficient documentation

## 2016-03-30 DIAGNOSIS — Z6841 Body Mass Index (BMI) 40.0 and over, adult: Secondary | ICD-10-CM | POA: Insufficient documentation

## 2016-03-30 DIAGNOSIS — I10 Essential (primary) hypertension: Secondary | ICD-10-CM | POA: Insufficient documentation

## 2016-03-30 DIAGNOSIS — R938 Abnormal findings on diagnostic imaging of other specified body structures: Secondary | ICD-10-CM | POA: Diagnosis not present

## 2016-03-30 DIAGNOSIS — N939 Abnormal uterine and vaginal bleeding, unspecified: Secondary | ICD-10-CM | POA: Diagnosis present

## 2016-03-30 DIAGNOSIS — E559 Vitamin D deficiency, unspecified: Secondary | ICD-10-CM | POA: Insufficient documentation

## 2016-03-30 DIAGNOSIS — R7303 Prediabetes: Secondary | ICD-10-CM | POA: Diagnosis not present

## 2016-03-30 DIAGNOSIS — Z79899 Other long term (current) drug therapy: Secondary | ICD-10-CM | POA: Diagnosis not present

## 2016-03-30 DIAGNOSIS — Z7984 Long term (current) use of oral hypoglycemic drugs: Secondary | ICD-10-CM | POA: Insufficient documentation

## 2016-03-30 HISTORY — DX: Presence of spectacles and contact lenses: Z97.3

## 2016-03-30 HISTORY — DX: Prediabetes: R73.03

## 2016-03-30 HISTORY — PX: DILATATION & CURRETTAGE/HYSTEROSCOPY WITH RESECTOCOPE: SHX5572

## 2016-03-30 HISTORY — PX: DILITATION & CURRETTAGE/HYSTROSCOPY WITH HYDROTHERMAL ABLATION: SHX5570

## 2016-03-30 HISTORY — DX: Abnormal findings on diagnostic imaging of other specified body structures: R93.89

## 2016-03-30 HISTORY — DX: Irregular menstruation, unspecified: N92.6

## 2016-03-30 HISTORY — DX: Personal history of other benign neoplasm: Z86.018

## 2016-03-30 LAB — POCT I-STAT, CHEM 8
BUN: 12 mg/dL (ref 6–20)
Calcium, Ion: 1.15 mmol/L (ref 1.15–1.40)
Chloride: 106 mmol/L (ref 101–111)
Creatinine, Ser: 0.7 mg/dL (ref 0.44–1.00)
Glucose, Bld: 96 mg/dL (ref 65–99)
HCT: 41 % (ref 36.0–46.0)
Hemoglobin: 13.9 g/dL (ref 12.0–15.0)
Potassium: 3.7 mmol/L (ref 3.5–5.1)
Sodium: 141 mmol/L (ref 135–145)
TCO2: 26 mmol/L (ref 0–100)

## 2016-03-30 LAB — POCT PREGNANCY, URINE: Preg Test, Ur: NEGATIVE

## 2016-03-30 SURGERY — DILATATION & CURETTAGE/HYSTEROSCOPY WITH RESECTOCOPE
Anesthesia: General | Site: Uterus

## 2016-03-30 MED ORDER — OXYCODONE HCL 5 MG/5ML PO SOLN
5.0000 mg | Freq: Once | ORAL | Status: DC | PRN
  Filled 2016-03-30: qty 5

## 2016-03-30 MED ORDER — KETOROLAC TROMETHAMINE 30 MG/ML IJ SOLN
INTRAMUSCULAR | Status: AC
  Filled 2016-03-30: qty 1

## 2016-03-30 MED ORDER — SODIUM CHLORIDE 0.9 % IR SOLN
Status: DC | PRN
  Administered 2016-03-30: 6000 mL
  Administered 2016-03-30: 3000 mL

## 2016-03-30 MED ORDER — LACTATED RINGERS IV SOLN
INTRAVENOUS | Status: DC
  Administered 2016-03-30: 11:00:00 via INTRAVENOUS
  Filled 2016-03-30: qty 1000

## 2016-03-30 MED ORDER — OXYCODONE HCL 5 MG PO TABS
5.0000 mg | ORAL_TABLET | Freq: Once | ORAL | Status: DC | PRN
  Filled 2016-03-30: qty 1

## 2016-03-30 MED ORDER — DEXAMETHASONE SODIUM PHOSPHATE 4 MG/ML IJ SOLN
INTRAMUSCULAR | Status: DC | PRN
  Administered 2016-03-30: 10 mg via INTRAVENOUS

## 2016-03-30 MED ORDER — LIDOCAINE HCL 2 % IJ SOLN
INTRAMUSCULAR | Status: DC | PRN
  Administered 2016-03-30: 10 mL

## 2016-03-30 MED ORDER — MIDAZOLAM HCL 2 MG/2ML IJ SOLN
INTRAMUSCULAR | Status: AC
  Filled 2016-03-30: qty 2

## 2016-03-30 MED ORDER — MEPERIDINE HCL 25 MG/ML IJ SOLN
6.2500 mg | INTRAMUSCULAR | Status: DC | PRN
  Filled 2016-03-30: qty 1

## 2016-03-30 MED ORDER — ONDANSETRON HCL 4 MG/2ML IJ SOLN
INTRAMUSCULAR | Status: AC
  Filled 2016-03-30: qty 2

## 2016-03-30 MED ORDER — PROPOFOL 10 MG/ML IV BOLUS
INTRAVENOUS | Status: DC | PRN
  Administered 2016-03-30: 200 mg via INTRAVENOUS

## 2016-03-30 MED ORDER — LIDOCAINE 2% (20 MG/ML) 5 ML SYRINGE
INTRAMUSCULAR | Status: DC | PRN
  Administered 2016-03-30: 100 mg via INTRAVENOUS

## 2016-03-30 MED ORDER — PROPOFOL 10 MG/ML IV BOLUS
INTRAVENOUS | Status: AC
  Filled 2016-03-30: qty 20

## 2016-03-30 MED ORDER — LIDOCAINE 2% (20 MG/ML) 5 ML SYRINGE
INTRAMUSCULAR | Status: AC
  Filled 2016-03-30: qty 5

## 2016-03-30 MED ORDER — DEXAMETHASONE SODIUM PHOSPHATE 10 MG/ML IJ SOLN
INTRAMUSCULAR | Status: AC
  Filled 2016-03-30: qty 1

## 2016-03-30 MED ORDER — FENTANYL CITRATE (PF) 100 MCG/2ML IJ SOLN
INTRAMUSCULAR | Status: DC | PRN
  Administered 2016-03-30 (×4): 25 ug via INTRAVENOUS

## 2016-03-30 MED ORDER — KETOROLAC TROMETHAMINE 30 MG/ML IJ SOLN
INTRAMUSCULAR | Status: DC | PRN
  Administered 2016-03-30: 30 mg via INTRAMUSCULAR
  Administered 2016-03-30: 30 mg via INTRAVENOUS

## 2016-03-30 MED ORDER — HYDROMORPHONE HCL 1 MG/ML IJ SOLN
0.2500 mg | INTRAMUSCULAR | Status: DC | PRN
  Filled 2016-03-30: qty 0.5

## 2016-03-30 MED ORDER — ONDANSETRON HCL 4 MG/2ML IJ SOLN
INTRAMUSCULAR | Status: DC | PRN
  Administered 2016-03-30: 4 mg via INTRAVENOUS

## 2016-03-30 MED ORDER — PROMETHAZINE HCL 25 MG/ML IJ SOLN
INTRAMUSCULAR | Status: AC
  Filled 2016-03-30: qty 1

## 2016-03-30 MED ORDER — IBUPROFEN 600 MG PO TABS: ORAL_TABLET | ORAL | 0 refills | Status: DC

## 2016-03-30 MED ORDER — PROMETHAZINE HCL 25 MG/ML IJ SOLN
6.2500 mg | INTRAMUSCULAR | Status: DC | PRN
  Administered 2016-03-30: 6.25 mg via INTRAVENOUS
  Filled 2016-03-30: qty 1

## 2016-03-30 MED ORDER — FENTANYL CITRATE (PF) 100 MCG/2ML IJ SOLN
INTRAMUSCULAR | Status: AC
  Filled 2016-03-30: qty 2

## 2016-03-30 MED ORDER — MIDAZOLAM HCL 5 MG/5ML IJ SOLN
INTRAMUSCULAR | Status: DC | PRN
  Administered 2016-03-30: 2 mg via INTRAVENOUS

## 2016-03-30 SURGICAL SUPPLY — 33 items
ABLATOR ENDOMETRIAL BIPOLAR (ABLATOR) IMPLANT
BIPOLAR CUTTING LOOP 21FR (ELECTRODE)
BOOTIES KNEE HIGH SLOAN (MISCELLANEOUS) ×4 IMPLANT
CANISTER SUCT 3000ML (MISCELLANEOUS) ×2 IMPLANT
CANISTER SUCT 3000ML PPV (MISCELLANEOUS) ×2 IMPLANT
CATH ROBINSON RED A/P 16FR (CATHETERS) ×2 IMPLANT
CONTAINER PREFILL 10% NBF 60ML (FORM) ×4 IMPLANT
COVER BACK TABLE 60X90IN (DRAPES) ×2 IMPLANT
DILATOR CANAL MILEX (MISCELLANEOUS) IMPLANT
DRAPE LG THREE QUARTER DISP (DRAPES) ×2 IMPLANT
DRAPE UNDERBUTTOCKS STRL (DRAPE) ×2 IMPLANT
DRSG TELFA 3X8 NADH (GAUZE/BANDAGES/DRESSINGS) ×2 IMPLANT
ELECT REM PT RETURN 9FT ADLT (ELECTROSURGICAL) ×2
ELECTRODE REM PT RTRN 9FT ADLT (ELECTROSURGICAL) ×1 IMPLANT
GLOVE BIO SURGEON STRL SZ 6.5 (GLOVE) ×2 IMPLANT
GLOVE INDICATOR 6.5 STRL GRN (GLOVE) ×2 IMPLANT
GLOVE INDICATOR 7.5 STRL GRN (GLOVE) ×2 IMPLANT
GLOVE SURG SS PI 6.5 STRL IVOR (GLOVE) ×4 IMPLANT
GOWN STRL REUS W/TWL LRG LVL3 (GOWN DISPOSABLE) ×4 IMPLANT
IV NS IRRIG 3000ML ARTHROMATIC (IV SOLUTION) ×2 IMPLANT
KIT RM TURNOVER CYSTO AR (KITS) ×2 IMPLANT
LEGGING LITHOTOMY PAIR STRL (DRAPES) ×2 IMPLANT
LOOP CUTTING BIPOLAR 21FR (ELECTRODE) IMPLANT
PACK BASIN DAY SURGERY FS (CUSTOM PROCEDURE TRAY) ×2 IMPLANT
PAD OB MATERNITY 4.3X12.25 (PERSONAL CARE ITEMS) ×2 IMPLANT
SET GENESYS HTA PROCERVA (MISCELLANEOUS) ×2 IMPLANT
SUT VIC AB 0 CT2 27 (SUTURE) IMPLANT
TOWEL OR 17X24 6PK STRL BLUE (TOWEL DISPOSABLE) ×4 IMPLANT
TRAY DSU PREP LF (CUSTOM PROCEDURE TRAY) ×2 IMPLANT
TUBE CONNECTING 12X1/4 (SUCTIONS) IMPLANT
TUBING AQUILEX INFLOW (TUBING) ×2 IMPLANT
TUBING AQUILEX OUTFLOW (TUBING) ×2 IMPLANT
WATER STERILE IRR 500ML POUR (IV SOLUTION) ×2 IMPLANT

## 2016-03-30 NOTE — Discharge Instructions (Signed)
Post Anesthesia Home Care Instructions  Activity: Get plenty of rest for the remainder of the day. A responsible individual must stay with you for 24 hours following the procedure.  For the next 24 hours, DO NOT: -Drive a car -Paediatric nurse -Drink alcoholic beverages -Take any medication unless instructed by your physician -Make any legal decisions or sign important papers.  Meals: Start with liquid foods such as gelatin or soup. Progress to regular foods as tolerated. Avoid greasy, spicy, heavy foods. If nausea and/or vomiting occur, drink only clear liquids until the nausea and/or vomiting subsides. Call your physician if vomiting continues.  Special Instructions/Symptoms: Your throat may feel dry or sore from the anesthesia or the breathing tube placed in your throat during surgery. If this causes discomfort, gargle with warm salt water. The discomfort should disappear within 24 hours.  If you had a scopolamine patch placed behind your ear for the management of post- operative nausea and/or vomiting:  1. The medication in the patch is effective for 72 hours, after which it should be removed.  Wrap patch in a tissue and discard in the trash. Wash hands thoroughly with soap and water. 2. You may remove the patch earlier than 72 hours if you experience unpleasant side effects which may include dry mouth, dizziness or visual disturbances. 3. Avoid touching the patch. Wash your hands with soap and water after contact with the patch.     [facility] Home care Instructions:   Personal hygiene:  Used sanitary napkins for vaginal drainage not tampons. Shower or tub bathe the day after your procedure. No douching until bleeding stops. Always wipe from front to back after  Elimination.  Activity: Do not drive or operate any equipment today. The effects of the anesthesia are still present and drowsiness may result. Rest today, not necessarily flat bed rest, just take it easy. You may resume your  normal activity in one to 2 days.  Sexual activity: No intercourse for one week or as indicated by your physician  Diet: Eat a light diet as desired this evening. You may resume a regular diet tomorrow.  Return to work: One to 2 days.  General Expectations of your surgery: Vaginal bleeding should be no heavier than a normal period. Spotting may continue up to 10 days. Mild cramps may continue for a couple of days. You may have a regular period in 2-6 weeks.  Unexpected observations call your doctor if these occur: persistent or heavy bleeding. Severe abdominal cramping or pain. Elevation of temperature greater than 100F.  Call for an appointment in one week.    Patient's Signature_______________________________________________________  Nurse's Signature________________________________________________________Hysteroscopy, Care After Refer to this sheet in the next few weeks. These instructions provide you with information on caring for yourself after your procedure. Your health care provider may also give you more specific instructions. Your treatment has been planned according to current medical practices, but problems sometimes occur. Call your health care provider if you have any problems or questions after your procedure. What can I expect after the procedure? After your procedure, it is typical to have the following:  You may have some cramping. This normally lasts for a couple days.  You may have bleeding. This can vary from light spotting for a few days to menstrual-like bleeding for 3-7 days. Follow these instructions at home:  Rest for the first 1-2 days after the procedure.  Only take over-the-counter or prescription medicines as directed by your health care provider. Do not take  aspirin. It can increase the chances of bleeding.  Take showers instead of baths for 2 weeks or as directed by your health care provider.  Do not drive for 24 hours or as directed.  Do not drink  alcohol while taking pain medicine.  Do not use tampons, douche, or have sexual intercourse for 2 weeks or until your health care provider says it is okay.  Take your temperature twice a day for 4-5 days. Write it down each time.  Follow your health care provider's advice about diet, exercise, and lifting.  If you develop constipation, you may:  Take a mild laxative if your health care provider approves.  Add bran foods to your diet.  Drink enough fluids to keep your urine clear or pale yellow.  Try to have someone with you or available to you for the first 24-48 hours, especially if you were given a general anesthetic.  Follow up with your health care provider as directed. Contact a health care provider if:  You feel dizzy or lightheaded.  You feel sick to your stomach (nauseous).  You have abnormal vaginal discharge.  You have a rash.  You have pain that is not controlled with medicine. Get help right away if:  You have bleeding that is heavier than a normal menstrual period.  You have a fever.  You have increasing cramps or pain, not controlled with medicine.  You have new belly (abdominal) pain.  You pass out.  You have pain in the tops of your shoulders (shoulder strap areas).  You have shortness of breath. This information is not intended to replace advice given to you by your health care provider. Make sure you discuss any questions you have with your health care provider. Document Released: 10/18/2012 Document Revised: 06/05/2015 Document Reviewed: 07/27/2012 Elsevier Interactive Patient Education  2017 [employer].

## 2016-03-30 NOTE — Anesthesia Procedure Notes (Signed)
Procedure Name: LMA Insertion Date/Time: 03/30/2016 11:18 AM Performed by: Candida Peeling RAY Pre-anesthesia Checklist: Patient identified, Emergency Drugs available, Suction available and Patient being monitored Patient Re-evaluated:Patient Re-evaluated prior to inductionOxygen Delivery Method: Circle system utilized Preoxygenation: Pre-oxygenation with 100% oxygen Intubation Type: IV induction Ventilation: Mask ventilation without difficulty LMA: LMA inserted LMA Size: 4.0 Number of attempts: 1 Airway Equipment and Method: Bite block Placement Confirmation: positive ETCO2 Tube secured with: Tape Dental Injury: Teeth and Oropharynx as per pre-operative assessment

## 2016-03-30 NOTE — Transfer of Care (Signed)
Immediate Anesthesia Transfer of Care Note  Patient: Mary Odonnell  Procedure(s) Performed: Procedure(s) (LRB): DILATATION & CURETTAGE/HYSTEROSCOPY WITH RESECTOCOPE WITH BIPOLAR RESECTION (N/A) DILATATION & CURETTAGE/HYSTEROSCOPY WITH HYDROTHERMAL ABLATION (N/A)  Patient Location: PACU  Anesthesia Type: General  Level of Consciousness: awake, oriented, sedated and patient cooperative  Airway & Oxygen Therapy: Patient Spontanous Breathing and Patient connected to face mask oxygen  Post-op Assessment: Report given to PACU RN and Post -op Vital signs reviewed and stable  Post vital signs: Reviewed and stable  Complications: No apparent anesthesia complications  Last Vitals:  Vitals:   03/30/16 1243 03/30/16 1245  BP: 130/76 (!) 144/92  Pulse: 84 76  Resp: 16 11  Temp: 37.1 C

## 2016-03-30 NOTE — Anesthesia Preprocedure Evaluation (Signed)
Anesthesia Evaluation  Patient identified by MRN, date of birth, ID band Patient awake    Reviewed: Allergy & Precautions, NPO status , Patient's Chart, lab work & pertinent test results  Airway Mallampati: II  TM Distance: >3 FB Neck ROM: Full    Dental no notable dental hx.    Pulmonary neg pulmonary ROS,    Pulmonary exam normal breath sounds clear to auscultation       Cardiovascular hypertension, negative cardio ROS Normal cardiovascular exam Rhythm:Regular Rate:Normal     Neuro/Psych negative neurological ROS  negative psych ROS   GI/Hepatic negative GI ROS, Neg liver ROS,   Endo/Other  negative endocrine ROSdiabetes, Type 2, Oral Hypoglycemic AgentsMorbid obesity  Renal/GU negative Renal ROS  negative genitourinary   Musculoskeletal negative musculoskeletal ROS (+)   Abdominal   Peds negative pediatric ROS (+)  Hematology negative hematology ROS (+)   Anesthesia Other Findings   Reproductive/Obstetrics negative OB ROS                             Anesthesia Physical Anesthesia Plan  ASA: III  Anesthesia Plan: General   Post-op Pain Management:    Induction: Intravenous  Airway Management Planned: LMA  Additional Equipment:   Intra-op Plan:   Post-operative Plan: Extubation in OR  Informed Consent: I have reviewed the patients History and Physical, chart, labs and discussed the procedure including the risks, benefits and alternatives for the proposed anesthesia with the patient or authorized representative who has indicated his/her understanding and acceptance.   Dental advisory given  Plan Discussed with: CRNA  Anesthesia Plan Comments:         Anesthesia Quick Evaluation

## 2016-03-30 NOTE — Anesthesia Postprocedure Evaluation (Signed)
Anesthesia Post Note  Patient: Mary Odonnell  Procedure(s) Performed: Procedure(s) (LRB): DILATATION & CURETTAGE/HYSTEROSCOPY WITH RESECTOCOPE WITH BIPOLAR RESECTION (N/A) DILATATION & CURETTAGE/HYSTEROSCOPY WITH HYDROTHERMAL ABLATION (N/A)  Patient location during evaluation: PACU Anesthesia Type: General Level of consciousness: awake and alert Pain management: pain level controlled Vital Signs Assessment: post-procedure vital signs reviewed and stable Respiratory status: spontaneous breathing, nonlabored ventilation and respiratory function stable Cardiovascular status: stable and blood pressure returned to baseline Anesthetic complications: no       Last Vitals:  Vitals:   03/30/16 1300 03/30/16 1315  BP: (!) 131/94 134/82  Pulse: 72 70  Resp: 17 13  Temp:      Last Pain:  Vitals:   03/30/16 1315  TempSrc:   PainSc: 0-No pain                 Lynda Rainwater

## 2016-03-30 NOTE — Op Note (Signed)
03/30/2016  12:33 PM  PATIENT:  Mary Odonnell  51 y.o. female  PRE-OPERATIVE DIAGNOSIS:  Thickened Endometrium, abnormal uterine bleeding  POST-OPERATIVE DIAGNOSIS:  Thickened Endometrium, abnormal uterine bleeding  PROCEDURE:  Procedure(s): /HYSTEROSCOPY WITH HYDROTHERMAL ABLATION  SURGEON:  Deverick Pruss P, MD  ASSISTANTS: None  ANESTHESIA:   general  ESTIMATED BLOOD LOSS: Less than 10 cc  BLOOD ADMINISTERED:none  COMPLICATIONS: None  FINDINGS: The uterus sounded to 7 cm. At hysteroscopy there were no endometrial lesions. The endometrial tissue was fluffy and copious  FLUID DEFICIT: 150 cc  LOCAL MEDICATIONS USED:  LIDOCAINE  and Amount: 10 ml  SPECIMEN:  No Specimen  DISPOSITION OF SPECIMEN:  N/A  COUNTS:  YES  DESCRIPTION OF PROCEDURE:the patient was taken to the operating room after appropriate identification and placed on the operating table. After the attainment of adequate general anesthesia she was placed in the lithotomy position. The perineum and vagina were prepped with multiple layers of Betadine. The bladder was emptied with a an in and out catheter. The perineum was draped in sterile field. A gray speculum was placed in the vagina. The cervix was grasped with a single-tooth tenaculum. A paracervical block was achieved with a total of 10 cc of 2% Xylocaine and the 5 and 7:00 positions. The uterus was sounded.  The cervix was then dilated to accommodate the diagnostic hysteroscope. The hysteroscope was used to evaluate all quadrants of the uterus. The HTA hysteroscope and apparatus are then placed into the cervix through the internal os. Once the assessment for cavity integrity was performed,  The normal saline was allowed to flow into the endometrial cavity then was heated to 90. A treatment cycle of a full 10 minutes was then achieved, though there were some decreases in flow detected during that 10 minute treatment cycle. The HTA apparatus was then removed  from the endometrial cavity. After the cooldown cycle was completed. Hysteroscopy was performed with visualization of ablation of the endometrial tissue. All instruments were then removed from the vagina and the patient was awakened from general anesthesia and taken to the recovery room in satisfactory condition having tolerated the procedure well sponge and instrument counts correct.  PLAN OF CARE: Discharge after postanesthesia care is complete  PATIENT DISPOSITION:  PACU - hemodynamically stable.   Delay start of Pharmacological VTE agent (>24hrs) due to surgical blood loss or risk of bleeding:  Yes.  SCD hose used during the procedure   Eldred Manges, MD 12:33 PM

## 2016-03-31 ENCOUNTER — Encounter (HOSPITAL_BASED_OUTPATIENT_CLINIC_OR_DEPARTMENT_OTHER): Payer: Self-pay | Admitting: Obstetrics and Gynecology

## 2017-05-04 ENCOUNTER — Other Ambulatory Visit: Payer: Self-pay | Admitting: Nurse Practitioner

## 2017-05-04 DIAGNOSIS — R109 Unspecified abdominal pain: Secondary | ICD-10-CM

## 2017-05-12 ENCOUNTER — Ambulatory Visit
Admission: RE | Admit: 2017-05-12 | Discharge: 2017-05-12 | Disposition: A | Discharge status: Home / Self Care | Payer: Managed Care, Other (non HMO) | Source: Ambulatory Visit | Attending: Nurse Practitioner | Admitting: Nurse Practitioner

## 2017-05-12 DIAGNOSIS — R109 Unspecified abdominal pain: Secondary | ICD-10-CM

## 2017-05-13 ENCOUNTER — Other Ambulatory Visit: Payer: 59

## 2017-05-16 ENCOUNTER — Other Ambulatory Visit: Payer: 59

## 2017-09-29 ENCOUNTER — Encounter: Payer: Managed Care, Other (non HMO) | Attending: Nurse Practitioner | Admitting: Registered"

## 2017-09-29 ENCOUNTER — Encounter: Payer: Self-pay | Admitting: Registered"

## 2017-09-29 DIAGNOSIS — Z713 Dietary counseling and surveillance: Secondary | ICD-10-CM | POA: Insufficient documentation

## 2017-09-29 DIAGNOSIS — R7303 Prediabetes: Secondary | ICD-10-CM | POA: Insufficient documentation

## 2017-09-29 NOTE — Progress Notes (Signed)
Medical Nutrition Therapy:  Appt start time: 0920 end time:  1020.  Assessment:  Primary concerns today: Pt referred due to pre-diabetes. 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 Pt works for [employer]. Pt reports that she needs to lose 10% of her weight by next year to meet her insurance requirements. Pt lives with her husband and daughter. Pt reports that her daughter has been cooking more vegetables lately-beans, asparagus, etc. Pt reports that she sometimes drinks soda if a drink is included with the meal at a restaurant or if others are drinking something sweet. Other times she usually drinks water. Pt reports that she sometimes has acid reflux and wants to work to eat her dinner earlier in the evening. Pt reports that she goes to bed around 10 pm usually. Pt reports that lately her daughter has had tennis matches which cause them to not get home for dinner until around 8-830 pm.  Noted Lab Values:  7/19 HgbA1c: 6.0  Preferred Learning Style:   No preference indicated   Learning Readiness:   Ready  MEDICATIONS: Reviewed. See list.    DIETARY INTAKE:  Usual eating pattern includes 3 meals during the week and 1-2 on the weekends and 1-2 snacks per day.Typical snacks include fruit or granola bar. Meals eaten at home are eaten in the kitchen with family and electronics are present.   Everyday foods include banana.  Avoided foods include none reported.    24-hr recall:  B ( AM): water, no breakfast due to not feeling well per pt.   Snk ( AM): None reported.   L ( PM): fried chicken, salad with fruit in it, banana pudding, Coke Snk ( PM): None reported.  D ( PM): 3 slices of pizza (two cheese and peppers, 1 cheese with pepperoni), small portion to brownie, water Snk ( PM): None reported.  Beverages: usually drinks about ~40 oz water; has soda about 3 times per week per pt.   Usual physical activity: None reported.    Progress Towards Goal(s):  In progress.   Nutritional Diagnosis:  NB-2.1 Physical inactivity As related to sedentary lifestyle .  As evidenced by pt's reported physical activity recall .    Intervention:  Nutrition counseling provided. Discussed pt's hgbA1c. Dietitian provided education regarding the relationship between blood sugar/insulin resistance, and dietary intake. Provided education regarding balanced and heart healthy nutrition and benefits of physical activity on blood sugar management. Dietitian worked with pt to set personal goals. Pt reports she would like to work on eating dinner no later than 7 pm, including more physical activity, and having healthy snacks. Dietitian also told pt about Diabetes Prevention Program and pt reports she would like to be put on list for further information. Encouraged pt to also include more water in place of sugary drinks. Pt appeared agreeable to information/goals discussed.   Instructions/Goals:  Make sure to get in three meals per day. Try to have balanced meals like the My Plate example (see handout). Include lean proteins, vegetables, fruits, and whole grains at meals.   Goal: Have dinner by 7 pm   Have beneficial snacks available (see list for ideas)   Continue working to include more water and less sugar sweetened beverages.   Include heart healthy unsaturated fats (liquid cooking oils, nuts, seeds, avocado, etc) in place of saturated fats (high fat meats, high fat dairy, butter, lard, coconut oil, etc).  Make physical activity a part  of your week. Try to include at least 30 minutes of physical activity 5 days each week or at least 150 minutes per week. Regular physical activity promotes overall health-including helping to reduce risk for heart disease and diabetes, promoting mental health, and helping [place] sleep better.    Starting Goal: Include at least 15 minutes of walking 5 days per week.  Teaching Method Utilized:   Visual Auditory  Handouts given during visit include:  Balanced plate and food list.   Balanced snack list.   Barriers to learning/adherence to lifestyle change: None indicated.   Demonstrated degree of understanding via:  Teach Back   Monitoring/Evaluation:  Dietary intake, exercise, and body weight in 4 week(s).

## 2017-09-29 NOTE — Patient Instructions (Addendum)
Instructions/Goals:  Make sure to get in three meals per day. Try to have balanced meals like the My Plate example (see handout). Include lean proteins, vegetables, fruits, and whole grains at meals.   Goal: Have dinner by 7 pm   Have beneficial snacks available (see list for ideas)   Continue working to include more water and less sugar sweetened beverages.   Include heart healthy unsaturated fats (liquid cooking oils, nuts, seeds, avocado, etc) in place of saturated fats (high fat meats, high fat dairy, butter, lard, coconut oil, etc).  Make physical activity a part of your week. Try to include at least 30 minutes of physical activity 5 days each week or at least 150 minutes per week. Regular physical activity promotes overall health-including helping to reduce risk for heart disease and diabetes, promoting mental health, and helping [place] sleep better.    Starting Goal: Include at least 15 minutes of walking 5 days per week.

## 2017-10-04 ENCOUNTER — Encounter: Payer: Self-pay | Admitting: Neurology

## 2017-10-10 ENCOUNTER — Encounter: Payer: Self-pay | Admitting: Neurology

## 2017-10-10 ENCOUNTER — Ambulatory Visit: Payer: Managed Care, Other (non HMO) | Admitting: Neurology

## 2017-10-10 VITALS — BP 135/85 | HR 93 | Ht 59.0 in | Wt 236.0 lb

## 2017-10-10 DIAGNOSIS — R0683 Snoring: Secondary | ICD-10-CM

## 2017-10-10 DIAGNOSIS — G4719 Other hypersomnia: Secondary | ICD-10-CM | POA: Diagnosis not present

## 2017-10-10 DIAGNOSIS — Z6841 Body Mass Index (BMI) 40.0 and over, adult: Secondary | ICD-10-CM

## 2017-10-10 DIAGNOSIS — F5104 Psychophysiologic insomnia: Secondary | ICD-10-CM | POA: Diagnosis not present

## 2017-10-10 NOTE — Progress Notes (Signed)
SLEEP MEDICINE CLINIC   Provider:  Larey Seat, M.D.   Primary Care Physician:  Minette Brine, FNP   Referring Provider:  Dr. Baird Cancer and NP Laurance Flatten.    Chief Complaint  Patient presents with  . Referral    Referral from Minette Brine FNP for sleep consult room 11 patient with    HPI:  Mary Odonnell is a 52 y.o. female patient seen here on 10-10-2017 in a referral from Minette Brine, NP. Mary Odonnell has HTN, DM- and irregular work hours and feels this has affected her sleep- she has palpitations, dreaming about work, waking up with a surge in adrenaline. She works as a Radio producer at [employer], with updates on soft ware - often at night. She likes her team. She has struggled with weight over a decade, 2 years ago was with weight watchers and frustrated - she lost down to a body weight to 122 pounds, but soon regained.  She is a stress eater, late at night, choices of food often poor.    Chief complaint according to patient :" I am very sleepy after my mid day meal- and I have trouble to sleep at night".  Sleep habits are as follows: Mary Odonnell usually can predict when she comes home from work but her work may still continue from home.  Her daughters are 52 and 31, younger daughter lives at home.  Mrs. [name] likes for the dinnertime to be concluded by 7 PM a bit of work out.  However she wants to keep about 2 to 3 hours between dinner and bedtime.  At this time she has not found a good way to build some exercise time into her evening hours.  She is usually on a screen, either working or for entertainment. The electronics are off by midnight- she cuts the TV off. There is a TV in the bedroom, her husband sleeps in front of it.  After 1 AM the bedroom is cool, quiet an dark. She prefers to sleep on her sides, on 2 pillows for head and chest support.  Her husband reports that she snores, and he will not sure if she snores loudly in supine. She goes to sleep between 12 or  1 AM, she can stay asleep until her first bathroom break  at 3 AM ( is on diuretics), and there may be a second break around 5 AM( 2 times a week ) and she may take AM medication - some mornings stays up after that. Usually able to sleep until alarm rings at 7 AM, she likes the snooze button.  She usually feels that she needs more sleep is not fully restored or fully refreshed in the morning.  She has had phases where she noted morning headaches but the headaches did not wake her she woke up with the headaches.  Currently she is headache free.  Sleep medical history and family sleep history: only child, mother is a snorer, not sure if OSA.  Her daughters do not snore. As a child she was sleep walking, not night terrors, no enuresis. .   Social history: married , 2 daughters, Librarian, academic at [employer]. Non smoker, ETOH ; socially- wine with dinner.  Caffeine;  Tea or cola soda every day- 2 glasses a day. Other wise water. Iced tea with lunch and dinner when eating out.    Review of Systems: Out of a complete 14 system review, the patient complains of only the following symptoms, and all other  reviewed systems are negative.  Snoring, leg swelling. HTN, DM, some insomnia. Lots of screen time.   Epworth score 12/ 24  , Fatigue severity score 24/ 63  , depression score n/a    Social History   Socioeconomic History  . Marital status: Married    Spouse name: Not on file  . Number of children: Not on file  . Years of education: Not on file  . Highest education level: Not on file  Occupational History  . Not on file  Social Needs  . Financial resource strain: Not on file  . Food insecurity:    Worry: Not on file    Inability: Not on file  . Transportation needs:    Medical: Not on file    Non-medical: Not on file  Tobacco Use  . Smoking status: Never Smoker  . Smokeless tobacco: Never Used  Substance and Sexual Activity  . Alcohol use: Yes    Alcohol/week: 1.0 standard drinks    Types: 1  Standard drinks or equivalent per week  . Drug use: No  . Sexual activity: Yes    Partners: Male    Birth control/protection: Other-see comments    Comment: husband -- vasectomy   Lifestyle  . Physical activity:    Days per week: Not on file    Minutes per session: Not on file  . Stress: Not on file  Relationships  . Social connections:    Talks on phone: Not on file    Gets together: Not on file    Attends religious service: Not on file    Active member of club or organization: Not on file    Attends meetings of clubs or organizations: Not on file    Relationship status: Not on file  . Intimate partner violence:    Fear of current or ex partner: Not on file    Emotionally abused: Not on file    Physically abused: Not on file    Forced sexual activity: Not on file  Other Topics Concern  . Not on file  Social History Narrative  . Not on file    Family History  Problem Relation Age of Onset  . Hypertension Mother   . Emphysema Maternal Grandfather     Past Medical History:  Diagnosis Date  . Abnormal finding on imaging   . Diabetes mellitus without complication ([place])   . History of uterine fibroid   . Hypertension   . Menstrual periods irregular   . Pre-diabetes   . Vitamin D deficiency   . Wears glasses     Past Surgical History:  Procedure Laterality Date  . CERVICAL CERCLAGE  04/ 01/ 2003  and 2000  . DILATATION & CURRETTAGE/HYSTEROSCOPY WITH RESECTOCOPE N/A 03/30/2016   Procedure: DILATATION & CURETTAGE/HYSTEROSCOPY WITH RESECTOCOPE WITH BIPOLAR RESECTION;  Surgeon: Eldred Manges, MD;  Location: [facility];  Service: Gynecology;  Laterality: N/A;  needs Largest Graves Speculum  . DILITATION & CURRETTAGE/HYSTROSCOPY WITH HYDROTHERMAL ABLATION N/A 03/30/2016   Procedure: DILATATION & CURETTAGE/HYSTEROSCOPY WITH HYDROTHERMAL ABLATION;  Surgeon: Eldred Manges, MD;  Location: [facility];  Service: Gynecology;  Laterality: N/A;    . TONSILLECTOMY  age 20  . TRANSTHORACIC ECHOCARDIOGRAM  04/01/2010   mild LVH, ef 65-70%    Current Outpatient Medications  Medication Sig Dispense Refill  . acetaminophen (TYLENOL) 500 MG tablet Take 500 mg by mouth every 6 (six) hours as needed.    Marland Kitchen amLODipine (NORVASC) 10 MG tablet  Take 10 mg by mouth every evening.     . cetirizine (ZYRTEC) 10 MG tablet Take 10 mg by mouth daily as needed.     . Cholecalciferol (VITAMIN D-3) 1000 units CAPS Take 1 capsule by mouth daily.    . furosemide (LASIX) 20 MG tablet Take 20 mg by mouth every morning.     Marland Kitchen ibuprofen (ADVIL,MOTRIN) 600 MG tablet Ibuprofen 600 mg orally every 6 hours for 3 days around-the-clock, then every 6 hours as needed for pain. 60 tablet 0  . losartan (COZAAR) 100 MG tablet Take 100 mg by mouth every evening.     . metFORMIN (GLUCOPHAGE-XR) 750 MG 24 hr tablet Take 750 mg by mouth every evening.     No current facility-administered medications for this visit.     Allergies as of 10/10/2017  . (No Known Allergies)    Vitals: BP 135/85 (BP Location: Right Arm, Patient Position: Standing, Cuff Size: Normal)   Pulse 93   Ht 4\' 11"  (1.499 m)   Wt 236 lb (107 kg)   BMI 47.67 kg/m  Last Weight:  Wt Readings from Last 1 Encounters:  10/10/17 236 lb (107 kg)   HLK:TGYB mass index is 47.67 kg/m.     Last Height:   Ht Readings from Last 1 Encounters:  10/10/17 4\' 11"  (1.499 m)    Physical exam:  General: The patient is awake, alert and appears not in acute distress. The patient is well groomed. Head: Normocephalic, atraumatic. Neck is supple. Mallampati 5 - uvula not visible. All teeth are biological. ,  neck circumference:16" . Nasal airflow patent ,  Retrognathia is mild .  Cardiovascular:  Regular rate and rhythm , without  murmurs or carotid bruit, and without distended neck veins. Respiratory: Lungs are clear to auscultation. Skin:  Without evidence of edema, or rash Trunk: BMI is 48. The patient's posture  is erect.   Neurologic exam : The patient is awake and alert, oriented to place and time.   Memory subjective described as intact. Attention span & concentration ability appears normal.  Speech is fluent, without dysarthria, dysphonia or aphasia.  Mood and affect are appropriate.  Cranial nerves: Pupils are equal and briskly reactive to light.Extraocular movements  in vertical and horizontal planes intact and without nystagmus. Visual fields by finger perimetry are intact.Hearing to finger rub intact, feeling of pressure. .  Facial sensation intact to fine touch. Facial motor strength is symmetric and tongue and uvula move midline. Shoulder shrug was symmetrical.   Motor exam:  Normal tone, muscle bulk and symmetric strength in all extremities. Sensory:  Fine touch, pinprick and vibration were tested in all extremities. Proprioception tested in the upper extremities was normal. Coordination Finger-to-nose maneuver normal without evidence of ataxia, dysmetria or tremor. Gait and station: Patient walks without assistive device and is able unassisted to climb up to the exam table. Strength within normal limits. Stance is stable and normal.Turns with 3 Steps.  Deep tendon reflexes: in the  upper and lower extremities are symmetric and intact.   Assessment:  After physical and neurologic examination, review of laboratory studies,  Personal review of imaging studies, reports of other /same  Imaging studies, results of polysomnography and / or neurophysiology testing and pre-existing records as far as provided in visit., my assessment is    Mrs. Salomon Mast. Haye is a pleasant 52 year old African-American right-handed lady who works as a Librarian, academic in a Psychologist, forensic at [employer].  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  She enjoys the team she is mostly working with, and overall finds her job rewarding.  There are some days per month's were certain programs have to be deployed  overnight or whenever there is not a high demand on lab services.  These may be the nights where she also has to be on call or supervising.  She is not considered a shift Insurance underwriter. 1) Mrs. Spiewak has risk factors for the presence of sleep apnea but her husband has never told her that she stops breathing in her sleep.  She is usually waking up with palpitations or choking when she had a vivid dream but she does not think that apnea or snoring actually wakes her.  Her risk factors include high body mass index, and above average neck circumference, and a high-grade Mallampati.  She has developed nocturia only after she was placed on fluid pills to control her hypertension.  Currently she is not suffering from morning migraines or headaches.  She does not consider her sleep to have the best quality as she is often not refreshed and restored and especially in the early afternoons feels that she could take a nap ad hoc.  2) she endorsed the Epworth sleepiness score at 12 out of 24 points and the fatigue severity scale at 24 points.  Depression score was endorsed at only 2 out of 15 points not significant.  She has some nights trouble to initiate sleep but can be some improvement may probably just by changing some sleep rules in the bedroom.  My main goal is to have the TV not on.  All electronics should be switched off 30 minutes before bedtime to allow the brain to secrete melatonin.  I will order a home sleep test for Mrs. Tippins to see if she has obstructive sleep apnea, if obstructive sleep apnea is associated with drips and oxygen saturation, if there are any heart rate irregularities that could explain her nocturnal palpitations.  We will meet after the sleep study has been interpreted.  If the sleep study suggests a moderate to severe degree of apnea or a significant hypoxemia component the treatment is usually CPAP. She should initiate sleep before midnight and allow for NREM 3 stage sleep to help with DM, HTN.    3) weight loss- consider medical weight management - and metabolic testing.    The patient was advised of the nature of the diagnosed disorder , the treatment options and the  risks for general health and wellness arising from not treating the condition.   I spent more than 45 minutes of face to face time with the patient.  Greater than 50% of time was spent in counseling and coordination of care. We have discussed the diagnosis and differential and I answered the patient's questions.    Plan:  Treatment plan and additional workup :  H[employer] - prefer watch pat.    Larey Seat, MD 1/61/0960, 4:54 PM  Certified in Neurology by ABPN Certified in [place] by [facility] [address] [place], [place] [address]

## 2017-10-10 NOTE — Patient Instructions (Signed)

## 2017-10-11 ENCOUNTER — Ambulatory Visit: Payer: Managed Care, Other (non HMO) | Admitting: Nurse Practitioner

## 2017-10-11 ENCOUNTER — Encounter: Payer: Self-pay | Admitting: Nurse Practitioner

## 2017-10-11 ENCOUNTER — Other Ambulatory Visit: Payer: Self-pay | Admitting: Nurse Practitioner

## 2017-10-11 VITALS — BP 118/84 | HR 84 | Temp 98.3°F | Ht <= 58 in | Wt 237.2 lb

## 2017-10-11 DIAGNOSIS — R202 Paresthesia of skin: Secondary | ICD-10-CM | POA: Diagnosis not present

## 2017-10-11 DIAGNOSIS — R1031 Right lower quadrant pain: Secondary | ICD-10-CM | POA: Diagnosis not present

## 2017-10-11 DIAGNOSIS — G5711 Meralgia paresthetica, right lower limb: Secondary | ICD-10-CM

## 2017-10-11 DIAGNOSIS — R7303 Prediabetes: Secondary | ICD-10-CM

## 2017-10-11 MED ORDER — PREDNISONE 20 MG PO TABS: ORAL_TABLET | ORAL | 0 refills | Status: DC

## 2017-10-11 NOTE — Progress Notes (Addendum)
Subjective:     Patient ID: Mary Odonnell, female   DOB: 06-Dec-1965, 52 y.o.   MRN: [id number]  Dysuria   This is a recurrent problem. The current episode started more than 1 month ago. The problem occurs intermittently. The problem has been gradually worsening. The quality of the pain is described as aching. There has been no fever. She is not sexually active. Pertinent negatives include no frequency. She has tried nothing for the symptoms. History of fibroids, being followed by Dr. Leo Grosser  Diabetes  She presents for her follow-up diabetic visit. Her disease course has been stable. Hypoglycemia symptoms include dizziness. There are no diabetic associated symptoms. Pertinent negatives for diabetes include no polydipsia and no polyphagia. When asked about current treatments, none were reported. Diabetic current diet: Regular. When asked about meal planning, she reported none. She has had a previous visit with a dietitian. She participates in exercise intermittently.     Review of Systems  Constitutional: Negative.   Eyes: Negative.   Respiratory: Negative.   Cardiovascular: Negative.   Gastrointestinal:       Right groin pain intermittently.  After having a bowel movement would be dizzy.    Endocrine: Negative.  Negative for polydipsia and polyphagia.  Genitourinary: Positive for dysuria. Negative for frequency.  Musculoskeletal: Negative.   Skin: Negative.   Neurological: Positive for dizziness.       Parasthesia, also having intermittent off balance issues.    Past Medical History:  Diagnosis Date  . Abnormal finding on imaging   . Diabetes mellitus without complication ([place])   . History of uterine fibroid   . Hypertension   . Menstrual periods irregular   . Pre-diabetes   . Vitamin D deficiency   . Wears glasses    Past Surgical History:  Procedure Laterality Date  . CERVICAL CERCLAGE  04/ 01/ 2003  and 2000  . DILATATION & CURRETTAGE/HYSTEROSCOPY WITH RESECTOCOPE N/A  03/30/2016   Procedure: DILATATION & CURETTAGE/HYSTEROSCOPY WITH RESECTOCOPE WITH BIPOLAR RESECTION;  Surgeon: Eldred Manges, MD;  Location: [facility];  Service: Gynecology;  Laterality: N/A;  needs Largest Graves Speculum  . DILITATION & CURRETTAGE/HYSTROSCOPY WITH HYDROTHERMAL ABLATION N/A 03/30/2016   Procedure: DILATATION & CURETTAGE/HYSTEROSCOPY WITH HYDROTHERMAL ABLATION;  Surgeon: Eldred Manges, MD;  Location: [facility];  Service: Gynecology;  Laterality: N/A;  . TONSILLECTOMY  age 43  . TRANSTHORACIC ECHOCARDIOGRAM  04/01/2010   mild LVH, ef 65-70%       Objective:   Physical Exam  Constitutional: She appears well-developed and well-nourished.  Obese  HENT:  Head: Normocephalic.  Cardiovascular: Normal rate, regular rhythm and normal heart sounds.  Pulmonary/Chest: Effort normal and breath sounds normal.  Abdominal: Soft. Normal appearance and bowel sounds are normal. There is tenderness.    Musculoskeletal: Normal range of motion.  Skin: Skin is warm and dry.       Assessment:     Meralgia paraesthetica, right - Will treat with predinsone 40mg  once daily for five days if not better return call to office will likely need to follow up with GYN - Dr.Haygood - Plan: predniSONE (DELTASONE) 20 MG tablet  Groin pain, right - Pain is worse when lying down on that side and unable to wear clothes that are tight fitting.  Take Prednisone as directed.   Prediabetes - Continue with current regimen and visiting with dietician. - Plan: Hemoglobin A1c  Tingling of both upper extremities - will check for metabolic issues.   -  Plan: Vitamin B12, TSH     Plan:

## 2017-10-12 LAB — TSH: TSH: 1.11 u[IU]/mL (ref 0.450–4.500)

## 2017-10-12 LAB — HEMOGLOBIN A1C
Est. average glucose Bld gHb Est-mCnc: 128 mg/dL
Hgb A1c MFr Bld: 6.1 % — ABNORMAL HIGH (ref 4.8–5.6)

## 2017-10-12 LAB — VITAMIN B12: Vitamin B-12: 640 pg/mL (ref 232–1245)

## 2017-11-02 ENCOUNTER — Encounter: Payer: Self-pay | Admitting: Nurse Practitioner

## 2017-11-02 ENCOUNTER — Ambulatory Visit: Payer: Self-pay | Admitting: Nurse Practitioner

## 2017-11-02 DIAGNOSIS — R7303 Prediabetes: Secondary | ICD-10-CM | POA: Insufficient documentation

## 2017-11-03 ENCOUNTER — Ambulatory Visit: Payer: Managed Care, Other (non HMO) | Admitting: Registered"

## 2017-11-04 NOTE — Progress Notes (Signed)
Yes, I agree 

## 2017-12-05 ENCOUNTER — Other Ambulatory Visit: Payer: Self-pay | Admitting: Nurse Practitioner

## 2017-12-06 LAB — HM DIABETES EYE EXAM

## 2017-12-10 ENCOUNTER — Other Ambulatory Visit: Payer: Self-pay | Admitting: Nurse Practitioner

## 2017-12-20 ENCOUNTER — Telehealth: Payer: Self-pay

## 2017-12-20 NOTE — Telephone Encounter (Signed)
We have attempted to call the patient two times to schedule sleep study.  Patient has been unavailable at the phone numbers we have on file and has not returned our calls.  At this point we will send a letter asking patient to please contact the sleep lab to schedule their sleep study.  If patient calls back we will schedule them for their sleep study. 

## 2018-01-04 ENCOUNTER — Other Ambulatory Visit: Payer: Self-pay | Admitting: Nurse Practitioner

## 2018-01-13 ENCOUNTER — Ambulatory Visit: Payer: Managed Care, Other (non HMO) | Admitting: Nurse Practitioner

## 2018-01-13 ENCOUNTER — Encounter: Payer: Self-pay | Admitting: Nurse Practitioner

## 2018-01-13 VITALS — BP 120/80 | HR 82 | Temp 98.0°F | Ht 59.0 in | Wt 240.8 lb

## 2018-01-13 DIAGNOSIS — R7303 Prediabetes: Secondary | ICD-10-CM | POA: Diagnosis not present

## 2018-01-13 DIAGNOSIS — Z1211 Encounter for screening for malignant neoplasm of colon: Secondary | ICD-10-CM | POA: Diagnosis not present

## 2018-01-13 DIAGNOSIS — I10 Essential (primary) hypertension: Secondary | ICD-10-CM

## 2018-01-13 LAB — BMP8+EGFR
BUN/Creatinine Ratio: 11 (ref 9–23)
BUN: 9 mg/dL (ref 6–24)
CO2: 26 mmol/L (ref 20–29)
Calcium: 9.1 mg/dL (ref 8.7–10.2)
Chloride: 101 mmol/L (ref 96–106)
Creatinine, Ser: 0.81 mg/dL (ref 0.57–1.00)
GFR calc Af Amer: 97 mL/min/{1.73_m2} (ref >59)
GFR calc non Af Amer: 84 mL/min/{1.73_m2} (ref >59)
Glucose: 102 mg/dL — ABNORMAL HIGH (ref 65–99)
Potassium: 4 mmol/L (ref 3.5–5.2)
Sodium: 139 mmol/L (ref 134–144)

## 2018-01-13 LAB — HEMOGLOBIN A1C
Est. average glucose Bld gHb Est-mCnc: 131 mg/dL
Hgb A1c MFr Bld: 6.2 % — ABNORMAL HIGH (ref 4.8–5.6)

## 2018-01-13 MED ORDER — CETIRIZINE HCL 10 MG PO TABS: 10.0000 mg | ORAL_TABLET | Freq: Every day | ORAL | 1 refills | Status: DC | PRN

## 2018-01-13 NOTE — Patient Instructions (Signed)
Obesity, Adult Obesity is having too much body fat. If you have a BMI of 30 or more, you are obese. BMI is a number that explains how much body fat you have. Obesity is often caused by taking in (consuming) more calories than your body uses. Obesity can cause serious health problems. Changing your lifestyle can help to treat obesity. Follow these instructions at home: Eating and drinking   Follow advice from your doctor about what to eat and drink. Your doctor may tell you to: ? Cut down on (limit) fast foods, sweets, and processed snack foods. ? Choose low-fat options. For example, choose low-fat milk instead of whole milk. ? Eat 5 or more servings of fruits or vegetables every day. ? Eat at home more often. This gives you more control over what you eat. ? Choose healthy foods when you eat out. ? Learn what a healthy portion size is. A portion size is the amount of a certain food that is healthy for you to eat at one time. This is different for each person. ? Keep low-fat snacks available. ? Avoid sugary drinks. These include soda, fruit juice, iced tea that is sweetened with sugar, and flavored milk. ? Eat a healthy breakfast.  Drink enough water to keep your pee (urine) clear or pale yellow.  Do not go without eating for [place] periods of time (do not fast).  Do not go on popular or trendy diets (fad diets). Physical Activity  Exercise often, as told by your doctor. Ask your doctor: ? What types of exercise are safe for you. ? How often you should exercise.  Warm up and stretch before being active.  Do slow stretching after being active (cool down).  Rest between times of being active. Lifestyle  Limit how much time you spend in front of your TV, computer, or video game system (be less sedentary).  Find ways to reward yourself that do not involve food.  Limit alcohol intake to no more than 1 drink a day for nonpregnant women and 2 drinks a day for men. One drink equals 12 oz  of beer, 5 oz of wine, or 1 oz of hard liquor. General instructions  Keep a weight loss journal. This can help you keep track of: ? The food that you eat. ? The exercise that you do.  Take over-the-counter and prescription medicines only as told by your doctor.  Take vitamins and supplements only as told by your doctor.  Think about joining a support group. Your doctor may be able to help with this.  Keep all follow-up visits as told by your doctor. This is important. Contact a doctor if:  You cannot meet your weight loss goal after you have changed your diet and lifestyle for 6 weeks. This information is not intended to replace advice given to you by your health care provider. Make sure you discuss any questions you have with your health care provider. Document Released: 03/22/2011 Document Revised: 06/05/2015 Document Reviewed: 10/16/2014 Elsevier Interactive Patient Education  2019 Elsevier Inc.  

## 2018-01-13 NOTE — Progress Notes (Signed)
Subjective:     Patient ID: Mary Odonnell , female    DOB: 10/14/1965 , 53 y.o.   MRN: [id number]   Chief Complaint  Patient presents with  . PREDIABETES F/U    HPI  She is here today for prediabetes     Abdominal Pain  This is a chronic problem. The current episode started more than 1 year ago. The onset quality is undetermined. The problem occurs intermittently. The pain is located in the LUQ. The abdominal pain does not radiate.     Past Medical History:  Diagnosis Date  . Abnormal finding on imaging   . Diabetes mellitus without complication ([place])   . History of uterine fibroid   . Hypertension   . Menstrual periods irregular   . Pre-diabetes   . Vitamin D deficiency   . Wears glasses      Family History  Problem Relation Age of Onset  . Hypertension Mother   . Emphysema Maternal Grandfather      Current Outpatient Medications:  .  acetaminophen (TYLENOL) 500 MG tablet, Take 500 mg by mouth every 6 (six) hours as needed., Disp: , Rfl:  .  amLODipine (NORVASC) 10 MG tablet, TAKE 1 TABLET BY MOUTH  EVERY DAY, Disp: 90 tablet, Rfl: 1 .  cetirizine (ZYRTEC) 10 MG tablet, Take 10 mg by mouth daily as needed. , Disp: , Rfl:  .  Cholecalciferol (VITAMIN D-3) 1000 units CAPS, Take 1 capsule by mouth daily., Disp: , Rfl:  .  furosemide (LASIX) 20 MG tablet, TAKE 1 TABLET BY MOUTH  EVERY DAY, Disp: 90 tablet, Rfl: 0 .  losartan (COZAAR) 100 MG tablet, TAKE 1 TABLET BY MOUTH  EVERY DAY, Disp: 90 tablet, Rfl: 1 .  metFORMIN (GLUCOPHAGE-XR) 750 MG 24 hr tablet, TAKE 1 TABLET BY MOUTH  EVERY DAY WITH EVENING MEAL, Disp: 90 tablet, Rfl: 0   No Known Allergies   Review of Systems  Constitutional: Negative.  Negative for fatigue.  Respiratory: Negative.   Cardiovascular: Negative.  Negative for chest pain, palpitations and leg swelling.  Gastrointestinal: Positive for abdominal pain.  Endocrine: Negative.  Negative for polydipsia, polyphagia and polyuria.  Musculoskeletal:  Negative.   Skin: Negative.   Neurological: Negative.  Negative for dizziness.  Psychiatric/Behavioral: Negative for confusion. The patient is not nervous/anxious.      Today's Vitals   01/13/18 1001  BP: 120/80  Pulse: 82  Temp: 98 F (36.7 C)  TempSrc: Oral  SpO2: 94%  Weight: 240 lb 12.8 oz (109.2 kg)  Height: '4\' 11"'  (1.499 m)  PainSc: 0-No pain   Body mass index is 48.64 kg/m.   Objective:  Physical Exam Constitutional:      Appearance: She is well-developed. She is obese.  Neck:     Musculoskeletal: Normal range of motion and neck supple.  Cardiovascular:     Rate and Rhythm: Normal rate and regular rhythm.     Heart sounds: Normal heart sounds. No murmur.  Pulmonary:     Effort: Pulmonary effort is normal.     Breath sounds: Normal breath sounds.  Chest:     Chest wall: No tenderness.  Abdominal:     General: Bowel sounds are normal. There is no distension.     Palpations: There is no mass.     Comments: No tenderness noted on palpation, ultrasound done approximately 1 month ago no abnormal findings.   Musculoskeletal: Normal range of motion.  Skin:    General: Skin is warm  and dry.     Capillary Refill: Capillary refill takes less than 2 seconds.  Neurological:     General: No focal deficit present.     Mental Status: She is alert and oriented to person, place, and time.  Psychiatric:        Mood and Affect: Mood normal.       Assessment And Plan:     1. Prediabetes  Chronic, controlled  Continue with current medications  Encouraged to limit intake of sugary foods and drinks  Encouraged to increase physical activity to 150 minutes per week  - Hemoglobin A1c - BMP8+eGFR  2. Essential hypertension . B/P is controlled.  . CMP ordered to check renal function.  . The importance of regular exercise and dietary modification was stressed to the patient.  . Stressed importance of losing ten percent of her body weight to help with B/P control.   . The weight loss would help with decreasing cardiac and cancer risk as well.   3. Encounter for screening colonoscopy  According to USPTF Colorectal cancer Screening guidelines. Colonoscopy is recommended every 10 years, starting at age 68years.  Will refer to GI for colon cancer screening. - Ambulatory referral to Gastroenterology    Minette Brine, FNP

## 2018-02-05 ENCOUNTER — Encounter: Payer: Self-pay | Admitting: Nurse Practitioner

## 2018-02-28 ENCOUNTER — Other Ambulatory Visit: Payer: Self-pay | Admitting: Nurse Practitioner

## 2018-03-27 ENCOUNTER — Encounter: Payer: Self-pay | Admitting: Nurse Practitioner

## 2018-03-31 ENCOUNTER — Other Ambulatory Visit: Payer: Self-pay | Admitting: Nurse Practitioner

## 2018-04-11 ENCOUNTER — Encounter: Payer: Self-pay | Admitting: Nurse Practitioner

## 2018-04-11 ENCOUNTER — Other Ambulatory Visit: Payer: Self-pay

## 2018-04-11 ENCOUNTER — Ambulatory Visit: Payer: Managed Care, Other (non HMO) | Admitting: Nurse Practitioner

## 2018-04-11 VITALS — BP 130/84 | HR 81 | Temp 97.6°F | Ht 58.8 in | Wt 235.6 lb

## 2018-04-11 DIAGNOSIS — K219 Gastro-esophageal reflux disease without esophagitis: Secondary | ICD-10-CM | POA: Diagnosis not present

## 2018-04-11 DIAGNOSIS — Z23 Encounter for immunization: Secondary | ICD-10-CM | POA: Diagnosis not present

## 2018-04-11 DIAGNOSIS — Z0000 Encounter for general adult medical examination without abnormal findings: Secondary | ICD-10-CM | POA: Diagnosis not present

## 2018-04-11 DIAGNOSIS — Z7189 Other specified counseling: Secondary | ICD-10-CM

## 2018-04-11 DIAGNOSIS — I10 Essential (primary) hypertension: Secondary | ICD-10-CM

## 2018-04-11 DIAGNOSIS — Z6841 Body Mass Index (BMI) 40.0 and over, adult: Secondary | ICD-10-CM

## 2018-04-11 LAB — CMP14 + ANION GAP
ALT: 21 IU/L (ref 0–32)
AST: 17 IU/L (ref 0–40)
Albumin/Globulin Ratio: 1.4 (ref 1.2–2.2)
Albumin: 4.2 g/dL (ref 3.8–4.9)
Alkaline Phosphatase: 69 IU/L (ref 39–117)
Anion Gap: 17 mmol/L (ref 10.0–18.0)
BUN/Creatinine Ratio: 12 (ref 9–23)
BUN: 9 mg/dL (ref 6–24)
Bilirubin Total: 0.4 mg/dL (ref 0.0–1.2)
CO2: 24 mmol/L (ref 20–29)
Calcium: 9.1 mg/dL (ref 8.7–10.2)
Chloride: 101 mmol/L (ref 96–106)
Creatinine, Ser: 0.76 mg/dL (ref 0.57–1.00)
GFR calc Af Amer: 105 mL/min/{1.73_m2} (ref >59)
GFR calc non Af Amer: 91 mL/min/{1.73_m2} (ref >59)
Globulin, Total: 2.9 g/dL (ref 1.5–4.5)
Glucose: 100 mg/dL — ABNORMAL HIGH (ref 65–99)
Potassium: 4.2 mmol/L (ref 3.5–5.2)
Sodium: 142 mmol/L (ref 134–144)
Total Protein: 7.1 g/dL (ref 6.0–8.5)

## 2018-04-11 LAB — HEMOGLOBIN A1C
Est. average glucose Bld gHb Est-mCnc: 128 mg/dL
Hgb A1c MFr Bld: 6.1 % — ABNORMAL HIGH (ref 4.8–5.6)

## 2018-04-11 LAB — CBC
Hematocrit: 40.9 % (ref 34.0–46.6)
Hemoglobin: 14 g/dL (ref 11.1–15.9)
MCH: 28.2 pg (ref 26.6–33.0)
MCHC: 34.2 g/dL (ref 31.5–35.7)
MCV: 83 fL (ref 79–97)
Platelets: 299 10*3/uL (ref 150–450)
RBC: 4.96 x10E6/uL (ref 3.77–5.28)
RDW: 13.7 % (ref 11.7–15.4)
WBC: 5.3 10*3/uL (ref 3.4–10.8)

## 2018-04-11 LAB — LIPID PANEL
Chol/HDL Ratio: 3 ratio (ref 0.0–4.4)
Cholesterol, Total: 157 mg/dL (ref 100–199)
HDL: 53 mg/dL (ref >39)
LDL Calculated: 88 mg/dL (ref 0–99)
Triglycerides: 80 mg/dL (ref 0–149)
VLDL Cholesterol Cal: 16 mg/dL (ref 5–40)

## 2018-04-11 MED ORDER — TETANUS-DIPHTH-ACELL PERTUSSIS 5-2.5-18.5 LF-MCG/0.5 IM SUSP
0.5000 mL | Freq: Once | INTRAMUSCULAR | Status: AC
  Administered 2018-04-11: 0.5 mL via INTRAMUSCULAR

## 2018-04-11 MED ORDER — OMEPRAZOLE 20 MG PO TBEC: 1.0000 | DELAYED_RELEASE_TABLET | Freq: Every day | ORAL | 1 refills | Status: DC

## 2018-04-11 NOTE — Patient Instructions (Signed)
Health Maintenance, Female °Adopting a healthy lifestyle and getting preventive care can go a [place] way to promote health and wellness. Talk with your health care provider about what schedule of regular examinations is right for you. This is a good chance for you to check in with your provider about disease prevention and staying healthy. °In between checkups, there are plenty of things you can do on your own. Experts have done a lot of research about which lifestyle changes and preventive measures are most likely to keep you healthy. Ask your health care provider for more information. °Weight and diet °Eat a healthy diet °· Be sure to include plenty of vegetables, fruits, low-fat dairy products, and lean protein. °· Do not eat a lot of foods high in solid fats, added sugars, or salt. °· Get regular exercise. This is one of the most important things you can do for your health. °? Most adults should exercise for at least 150 minutes each week. The exercise should increase your heart rate and make you sweat (moderate-intensity exercise). °? Most adults should also do strengthening exercises at least twice a week. This is in addition to the moderate-intensity exercise. °Maintain a healthy weight °· Body mass index (BMI) is a measurement that can be used to identify possible weight problems. It estimates body fat based on height and weight. Your health care provider can help determine your BMI and help you achieve or maintain a healthy weight. °· For females 20 years of age and older: °? A BMI below 18.5 is considered underweight. °? A BMI of 18.5 to 24.9 is normal. °? A BMI of 25 to 29.9 is considered overweight. °? A BMI of 30 and above is considered obese. °Watch levels of cholesterol and blood lipids °· You should start having your blood tested for lipids and cholesterol at 52 years of age, then have this test every 5 years. °· You may need to have your cholesterol levels checked more often if: °? Your lipid or  cholesterol levels are high. °? You are older than 52 years of age. °? You are at high risk for heart disease. °Cancer screening °Lung Cancer °· Lung cancer screening is recommended for adults 55-80 years old who are at high risk for lung cancer because of a history of smoking. °· A yearly low-dose CT scan of the lungs is recommended for people who: °? Currently smoke. °? Have quit within the past 15 years. °? Have at least a 30-pack-year history of smoking. A pack year is smoking an average of one pack of cigarettes a day for 1 year. °· Yearly screening should continue until it has been 15 years since you quit. °· Yearly screening should stop if you develop a health problem that would prevent you from having lung cancer treatment. °Breast Cancer °· Practice breast self-awareness. This means understanding how your breasts normally appear and feel. °· It also means doing regular breast self-exams. Let your health care provider know about any changes, no matter how small. °· If you are in your 20s or 30s, you should have a clinical breast exam (CBE) by a health care provider every 1-3 years as part of a regular health exam. °· If you are 40 or older, have a CBE every year. Also consider having a breast X-ray (mammogram) every year. °· If you have a family history of breast cancer, talk to your health care provider about genetic screening. °· If you are at high risk for breast cancer, talk   to your health care provider about having an MRI and a mammogram every year.  Breast cancer gene (BRCA) assessment is recommended for women who have family members with BRCA-related cancers. BRCA-related cancers include: ? Breast. ? Ovarian. ? Tubal. ? Peritoneal cancers.  Results of the assessment will determine the need for genetic counseling and BRCA1 and BRCA2 testing. Cervical Cancer Your health care provider may recommend that you be screened regularly for cancer of the pelvic organs (ovaries, uterus, and vagina).  This screening involves a pelvic examination, including checking for microscopic changes to the surface of your cervix (Pap test). You may be encouraged to have this screening done every 3 years, beginning at age 31.  For women ages 19-65, health care providers may recommend pelvic exams and Pap testing every 3 years, or they may recommend the Pap and pelvic exam, combined with testing for human papilloma virus (HPV), every 5 years. Some types of HPV increase your risk of cervical cancer. Testing for HPV may also be done on women of any age with unclear Pap test results.  Other health care providers may not recommend any screening for nonpregnant women who are considered low risk for pelvic cancer and who do not have symptoms. Ask your health care provider if a screening pelvic exam is right for you.  If you have had past treatment for cervical cancer or a condition that could lead to cancer, you need Pap tests and screening for cancer for at least 20 years after your treatment. If Pap tests have been discontinued, your risk factors (such as having a new sexual partner) need to be reassessed to determine if screening should resume. Some women have medical problems that increase the chance of getting cervical cancer. In these cases, your health care provider may recommend more frequent screening and Pap tests. Colorectal Cancer  This type of cancer can be detected and often prevented.  Routine colorectal cancer screening usually begins at 52 years of age and continues through 52 years of age.  Your health care provider may recommend screening at an earlier age if you have risk factors for colon cancer.  Your health care provider may also recommend using home test kits to check for hidden blood in the stool.  A small camera at the end of a tube can be used to examine your colon directly (sigmoidoscopy or colonoscopy). This is done to check for the earliest forms of colorectal cancer.  Routine  screening usually begins at age 17.  Direct examination of the colon should be repeated every 5-10 years through 51 years of age. However, you may need to be screened more often if early forms of precancerous polyps or small growths are found. Skin Cancer  Check your skin from head to toe regularly.  Tell your health care provider about any new moles or changes in moles, especially if there is a change in a mole's shape or color.  Also tell your health care provider if you have a mole that is larger than the size of a pencil eraser.  Always use sunscreen. Apply sunscreen liberally and repeatedly throughout the day.  Protect yourself by wearing [place] sleeves, pants, a wide-brimmed hat, and sunglasses whenever you are outside. Heart disease, diabetes, and high blood pressure  High blood pressure causes heart disease and increases the risk of stroke. High blood pressure is more likely to develop in: ? People who have blood pressure in the high end of the normal range (130-139/85-89 mm Hg). ? People  who are overweight or obese. ? People who are African American.  If you are 78-43 years of age, have your blood pressure checked every 3-5 years. If you are 83 years of age or older, have your blood pressure checked every year. You should have your blood pressure measured twice--once when you are at a hospital or clinic, and once when you are not at a hospital or clinic. Record the average of the two measurements. To check your blood pressure when you are not at a hospital or clinic, you can use: ? An automated blood pressure machine at a pharmacy. ? A home blood pressure monitor.  If you are between 65 years and 60 years old, ask your health care provider if you should take aspirin to prevent strokes.  Have regular diabetes screenings. This involves taking a blood sample to check your fasting blood sugar level. ? If you are at a normal weight and have a low risk for diabetes, have this test once  every three years after 52 years of age. ? If you are overweight and have a high risk for diabetes, consider being tested at a younger age or more often. Preventing infection Hepatitis B  If you have a higher risk for hepatitis B, you should be screened for this virus. You are considered at high risk for hepatitis B if: ? You were born in a country where hepatitis B is common. Ask your health care provider which countries are considered high risk. ? Your parents were born in a high-risk country, and you have not been immunized against hepatitis B (hepatitis B vaccine). ? You have HIV or AIDS. ? You use needles to inject street drugs. ? You live with someone who has hepatitis B. ? You have had sex with someone who has hepatitis B. ? You get hemodialysis treatment. ? You take certain medicines for conditions, including cancer, organ transplantation, and autoimmune conditions. Hepatitis C  Blood testing is recommended for: ? Everyone born from [birth year] through 1964. ? Anyone with known risk factors for hepatitis C. Sexually transmitted infections (STIs)  You should be screened for sexually transmitted infections (STIs) including gonorrhea and chlamydia if: ? You are sexually active and are younger than 52 years of age. ? You are older than 52 years of age and your health care provider tells you that you are at risk for this type of infection. ? Your sexual activity has changed since you were last screened and you are at an increased risk for chlamydia or gonorrhea. Ask your health care provider if you are at risk.  If you do not have HIV, but are at risk, it may be recommended that you take a prescription medicine daily to prevent HIV infection. This is called pre-exposure prophylaxis (PrEP). You are considered at risk if: ? You are sexually active and do not regularly use condoms or know the HIV status of your partner(s). ? You take drugs by injection. ? You are sexually active with a partner  who has HIV. Talk with your health care provider about whether you are at high risk of being infected with HIV. If you choose to begin PrEP, you should first be tested for HIV. You should then be tested every 3 months for as [place] as you are taking PrEP. Pregnancy  If you are premenopausal and you may become pregnant, ask your health care provider about preconception counseling.  If you may become pregnant, take 400 to 800 micrograms (mcg) of folic acid every  day.  If you want to prevent pregnancy, talk to your health care provider about birth control (contraception). Osteoporosis and menopause  Osteoporosis is a disease in which the bones lose minerals and strength with aging. This can result in serious bone fractures. Your risk for osteoporosis can be identified using a bone density scan.  If you are 12 years of age or older, or if you are at risk for osteoporosis and fractures, ask your health care provider if you should be screened.  Ask your health care provider whether you should take a calcium or vitamin D supplement to lower your risk for osteoporosis.  Menopause may have certain physical symptoms and risks.  Hormone replacement therapy may reduce some of these symptoms and risks. Talk to your health care provider about whether hormone replacement therapy is right for you. Follow these instructions at home:  Schedule regular health, dental, and eye exams.  Stay current with your immunizations.  Do not use any tobacco products including cigarettes, chewing tobacco, or electronic cigarettes.  If you are pregnant, do not drink alcohol.  If you are breastfeeding, limit how much and how often you drink alcohol.  Limit alcohol intake to no more than 1 drink per day for nonpregnant women. One drink equals 12 ounces of beer, 5 ounces of wine, or 1 ounces of hard liquor.  Do not use street drugs.  Do not share needles.  Ask your health care provider for help if you need support  or information about quitting drugs.  Tell your health care provider if you often feel depressed.  Tell your health care provider if you have ever been abused or do not feel safe at home. This information is not intended to replace advice given to you by your health care provider. Make sure you discuss any questions you have with your health care provider. Document Released: 07/13/2010 Document Revised: 06/05/2015 Document Reviewed: 10/01/2014 Elsevier Interactive Patient Education  2019 [place] Following a healthy eating pattern may help you to achieve and maintain a healthy body weight, reduce the risk of chronic disease, and live a [place] and productive life. It is important to follow a healthy eating pattern at an appropriate calorie level for your body. Your nutritional needs should be met primarily through food by choosing a variety of nutrient-rich foods. What are tips for following this plan? Reading food labels  Read labels and choose the following: ? Reduced or low sodium. ? Juices with 100% fruit juice. ? Foods with low saturated fats and high polyunsaturated and monounsaturated fats. ? Foods with whole grains, such as whole wheat, cracked wheat, brown rice, and wild rice. ? Whole grains that are fortified with folic acid. This is recommended for women who are pregnant or who want to become pregnant.  Read labels and avoid the following: ? Foods with a lot of added sugars. These include foods that contain brown sugar, corn sweetener, corn syrup, dextrose, fructose, glucose, high-fructose corn syrup, honey, invert sugar, lactose, malt syrup, maltose, molasses, raw sugar, sucrose, trehalose, or turbinado sugar.  Do not eat more than the following amounts of added sugar per day:  6 teaspoons (25 g) for women.  9 teaspoons (38 g) for men. ? Foods that contain processed or refined starches and grains. ? Refined grain products, such as white flour, degermed  cornmeal, white bread, and white rice. Shopping  Choose nutrient-rich snacks, such as vegetables, whole fruits, and nuts. Avoid high-calorie and high-sugar snacks, such  as potato chips, fruit snacks, and candy.  Use oil-based dressings and spreads on foods instead of solid fats such as butter, stick margarine, or cream cheese.  Limit pre-made sauces, mixes, and "instant" products such as flavored rice, instant noodles, and ready-made pasta.  Try more plant-protein sources, such as tofu, tempeh, black beans, edamame, lentils, nuts, and seeds.  Explore eating plans such as the Mediterranean diet or vegetarian diet. Cooking  Use oil to saut or stir-fry foods instead of solid fats such as butter, stick margarine, or lard.  Try baking, boiling, grilling, or broiling instead of frying.  Remove the fatty part of meats before cooking.  Steam vegetables in water or broth. Meal planning   At meals, imagine dividing your plate into fourths: ? One-half of your plate is fruits and vegetables. ? One-fourth of your plate is whole grains. ? One-fourth of your plate is protein, especially lean meats, poultry, eggs, tofu, beans, or nuts.  Include low-fat dairy as part of your daily diet. Lifestyle  Choose healthy options in all settings, including home, work, school, restaurants, or stores.  Prepare your food safely: ? Wash your hands after handling raw meats. ? Keep food preparation surfaces clean by regularly washing with hot, soapy water. ? Keep raw meats separate from ready-to-eat foods, such as fruits and vegetables. ? Cook seafood, meat, poultry, and eggs to the recommended internal temperature. ? Store foods at safe temperatures. In general:  Keep cold foods at 33F (4.4C) or below.  Keep hot foods at 133F (60C) or above.  Keep your freezer at [facility] (-17.8C) or below.  Foods are no longer safe to eat when they have been between the temperatures of 40-133F (4.4-60C) for more  than 2 hours. What foods should I eat? Fruits Aim to eat 2 cup-equivalents of fresh, canned (in natural juice), or frozen fruits each day. Examples of 1 cup-equivalent of fruit include 1 small apple, 8 large strawberries, 1 cup canned fruit,  cup dried fruit, or 1 cup 100% juice. Vegetables Aim to eat 2-3 cup-equivalents of fresh and frozen vegetables each day, including different varieties and colors. Examples of 1 cup-equivalent of vegetables include 2 medium carrots, 2 cups raw, leafy greens, 1 cup chopped vegetable (raw or cooked), or 1 medium baked potato. Grains Aim to eat 6 ounce-equivalents of whole grains each day. Examples of 1 ounce-equivalent of grains include 1 slice of bread, 1 cup ready-to-eat cereal, 3 cups popcorn, or  cup cooked rice, pasta, or cereal. Meats and other proteins Aim to eat 5-6 ounce-equivalents of protein each day. Examples of 1 ounce-equivalent of protein include 1 egg, 1/2 cup nuts or seeds, or 1 tablespoon (16 g) peanut butter. A cut of meat or fish that is the size of a deck of cards is about 3-4 ounce-equivalents.  Of the protein you eat each week, try to have at least 8 ounces come from seafood. This includes salmon, trout, herring, and anchovies. Dairy Aim to eat 3 cup-equivalents of fat-free or low-fat dairy each day. Examples of 1 cup-equivalent of dairy include 1 cup (240 mL) milk, 8 ounces (250 g) yogurt, 1 ounces (44 g) natural cheese, or 1 cup (240 mL) fortified soy milk. Fats and oils  Aim for about 5 teaspoons (21 g) per day. Choose monounsaturated fats, such as canola and olive oils, avocados, peanut butter, and most nuts, or polyunsaturated fats, such as sunflower, corn, and soybean oils, walnuts, pine nuts, sesame seeds, sunflower seeds, and flaxseed. Beverages  Aim  for six 8-oz glasses of water per day. Limit coffee to three to five 8-oz cups per day.  Limit caffeinated beverages that have added calories, such as soda and energy  drinks.  Limit alcohol intake to no more than 1 drink a day for nonpregnant women and 2 drinks a day for men. One drink equals 12 oz of beer (355 mL), 5 oz of wine (148 mL), or 1 oz of hard liquor (44 mL). Seasoning and other foods  Avoid adding excess amounts of salt to your foods. Try flavoring foods with herbs and spices instead of salt.  Avoid adding sugar to foods.  Try using oil-based dressings, sauces, and spreads instead of solid fats. This information is based on general [place] nutrition guidelines. For more information, visit [employer]. Exact amounts may vary based on your nutrition needs. Summary  A healthy eating plan may help you to maintain a healthy weight, reduce the risk of chronic diseases, and stay active throughout your life.  Plan your meals. Make sure you eat the right portions of a variety of nutrient-rich foods.  Try baking, boiling, grilling, or broiling instead of frying.  Choose healthy options in all settings, including home, work, school, restaurants, or stores. This information is not intended to replace advice given to you by your health care provider. Make sure you discuss any questions you have with your health care provider. Document Released: 04/11/2017 Document Revised: 04/11/2017 Document Reviewed: 04/11/2017 Elsevier Interactive Patient Education  2019 [employer]. Diphtheria Toxoid; Tetanus Toxoid Adsorbed, DT, Td What is this medicine? DIPHTHERIA AND TETANUS TOXOIDS ADSORBED (dif THEER ee uh and TET n [place] TOK soids ad SAWRB) is a vaccine. It is used to prevent infections of diphtheria and tetanus (lockjaw). This medicine may be used for other purposes; ask your health care provider or pharmacist if you have questions. COMMON BRAND NAME(S): DECAVAC, TDVAX, TENIVAC What should I tell my health care provider before I take this medicine? They need to know if you have any of these conditions: -bleeding disorder -immune system  problems -infection with fever -low levels of platelets in the blood -an unusual or allergic reaction to diphtheria or tetanus toxoid, latex, thimerosal, other medicines, foods, dyes, or preservatives -pregnant or trying to get pregnant -breast-feeding How should I use this medicine? This vaccine is for injection into a muscle. It is given by a health care professional. A copy of Vaccine Information Statements will be given before each vaccination. Read this sheet carefully each time. The sheet may change frequently. Talk to your pediatrician regarding the use of this medicine in children. While this drug may be prescribed for selected conditions, precautions do apply. Overdosage: If you think you have taken too much of this medicine contact a poison control center or emergency room at once. NOTE: This medicine is only for you. Do not share this medicine with others. What if I miss a dose? Keep appointments for follow-up (booster) doses as directed. It is important not to miss your dose. Call your doctor or health care professional if you are unable to keep an appointment. What may interact with this medicine? -adalimumab -anakinra -infliximab -live vaccines -medicines that suppress your immune system -medicines to treat cancer -medicines that treat or prevent blood clots like daily aspirin, enoxaparin, heparin, ticlopidine, warfarin -radiopharmaceuticals like iodine I-125 or I-131 This list may not describe all possible interactions. Give your health care provider a list of all the medicines, herbs, non-prescription drugs, or dietary supplements you use. Also tell  them if you smoke, drink alcohol, or use illegal drugs. Some items may interact with your medicine. What should I watch for while using this medicine? Contact your doctor or health care professional and seek emergency medical care if any serious side effects occur. This vaccine, like all vaccines, may not fully protect  everyone. What side effects may I notice from receiving this medicine? Side effects that you should report to your doctor or health care professional as soon as possible: -allergic reactions like skin rash, itching or hives, swelling of the face, lips, or tongue -arthritis pain -breathing problems -changes in hearing -extreme changes in behavior -fast, irregular heartbeat -fever over 100 degrees F -pain, tingling, numbness in the hands or feet -seizures -unusually weak or tired Side effects that usually do not require medical attention (report to your doctor or health care professional if they continue or are bothersome): -aches or pains -bruising, pain, swelling at site where injected -headache -loss of appetite -low-grade fever of 100 degrees F or less -nausea, vomiting -sleepy -swollen glands This list may not describe all possible side effects. Call your doctor for medical advice about side effects. You may report side effects to FDA at 1-800-FDA-1088. Where should I keep my medicine? This drug is given in a hospital or clinic and will not be stored at home. NOTE: This sheet is a summary. It may not cover all possible information. If you have questions about this medicine, talk to your doctor, pharmacist, or health care provider.  2019 Elsevier/Gold Standard (2007-04-27 13:57:36)  CDC and look for Eat More Weigh Less

## 2018-04-11 NOTE — Progress Notes (Addendum)
Subjective:     Patient ID: Mary Odonnell , female    DOB: 1965/03/08 , 53 y.o.   MRN: [id number]   Chief Complaint  Patient presents with  . Annual Exam   The patient states she uses none for birth control. Last LMP was Patient's last menstrual period was 01/24/2016 (approximate).. Negative for Dysmenorrhea and Negative for Menorrhagia Mammogram last done Dr. Mancel Bale February 2020. Negative for: breast discharge, breast lump(s), breast pain and breast self exam.  Pertinent negatives include abnormal bleeding (hematology), anxiety, decreased libido, depression, difficulty falling sleep, dyspareunia, history of infertility, nocturia, sexual dysfunction, sleep disturbances, urinary incontinence, urinary urgency, vaginal discharge and vaginal itching. Diet regular.The patient states her exercise level is   daily walking has been since having to stay home due to COVID-19.   The patient's tobacco use is:  Social History   Tobacco Use  Smoking Status Never Smoker  Smokeless Tobacco Never Used  . She has been exposed to passive smoke. The patient's alcohol use is:  Social History   Substance and Sexual Activity  Alcohol Use Yes  . Alcohol/week: 1.0 standard drinks  . Types: 1 Standard drinks or equivalent per week   Additional information: Last pap 2020 February with Dr. Mancel Bale, next one scheduled for 2021  HPI  Here for HM  Left foot pain intermittently.  Last for few days at a time.    Wt Readings from Last 3 Encounters: 04/11/18 : 235 lb 9.6 oz (106.9 kg) 01/13/18 : 240 lb 12.8 oz (109.2 kg) 08/01/17 : 233 lb 3.2 oz (105.8 kg)  Hypertension  This is a chronic problem. The current episode started more than 1 year ago. The problem is unchanged. The problem is controlled. Pertinent negatives include no anxiety. There are no associated agents to hypertension. There are no known risk factors for coronary artery disease. Past treatments include angiotensin blockers. There are no  compliance problems.  There is no history of chronic renal disease.  Gastroesophageal Reflux  She complains of heartburn. She reports no abdominal pain or no sore throat. The problem occurs occasionally. The problem has been waxing and waning. The heartburn is located in the substernum. The heartburn is of mild intensity. The heartburn does not wake her from sleep. The heartburn does not limit her activity. The heartburn doesn't change with position.     Past Medical History:  Diagnosis Date  . Abnormal finding on imaging   . Diabetes mellitus without complication ([place])   . History of uterine fibroid   . Hypertension   . Menstrual periods irregular   . Pre-diabetes   . Vitamin D deficiency   . Wears glasses      Family History  Problem Relation Age of Onset  . Hypertension Mother   . Emphysema Maternal Grandfather      Current Outpatient Medications:  .  acetaminophen (TYLENOL) 500 MG tablet, Take 500 mg by mouth every 6 (six) hours as needed., Disp: , Rfl:  .  amLODipine (NORVASC) 10 MG tablet, TAKE 1 TABLET BY MOUTH  EVERY DAY, Disp: 90 tablet, Rfl: 1 .  cetirizine (ZYRTEC) 10 MG tablet, Take 1 tablet (10 mg total) by mouth daily as needed., Disp: 90 tablet, Rfl: 1 .  Cholecalciferol (VITAMIN D-3) 1000 units CAPS, Take 1 capsule by mouth daily., Disp: , Rfl:  .  furosemide (LASIX) 20 MG tablet, TAKE 1 TABLET BY MOUTH  EVERY DAY, Disp: 90 tablet, Rfl: 0 .  losartan (COZAAR) 100 MG tablet, TAKE  1 TABLET BY MOUTH  EVERY DAY, Disp: 90 tablet, Rfl: 1 .  metFORMIN (GLUCOPHAGE-XR) 750 MG 24 hr tablet, TAKE 1 TABLET BY MOUTH  EVERY DAY WITH EVENING MEAL, Disp: 90 tablet, Rfl: 0   No Known Allergies   Review of Systems  Constitutional: Negative.   HENT: Negative.  Negative for sore throat.   Eyes: Negative.   Respiratory: Negative.   Cardiovascular: Negative.   Gastrointestinal: Positive for heartburn. Negative for abdominal pain.  Endocrine: Negative.   Genitourinary: Negative.    Musculoskeletal: Negative.   Skin: Negative.   Allergic/Immunologic: Negative.   Neurological: Negative.   Hematological: Negative.   Psychiatric/Behavioral: Negative.      Today's Vitals   04/11/18 0858  BP: 130/84  Pulse: 81  Temp: 97.6 F (36.4 C)  TempSrc: Oral  SpO2: 96%  Weight: 235 lb 9.6 oz (106.9 kg)  Height: 4' 10.8" (1.494 m)   Body mass index is 47.91 kg/m.   Objective:  Physical Exam Vitals signs reviewed.  Constitutional:      Appearance: Normal appearance. She is well-developed.  HENT:     Head: Normocephalic and atraumatic.     Right Ear: Hearing, tympanic membrane, ear canal and external ear normal.     Left Ear: Hearing, tympanic membrane, ear canal and external ear normal.     Nose: Nose normal. No congestion.     Mouth/Throat:     Mouth: Mucous membranes are moist.  Eyes:     General: Lids are normal.     Conjunctiva/sclera: Conjunctivae normal.     Pupils: Pupils are equal, round, and reactive to light.     Funduscopic exam:    Right eye: No papilledema.        Left eye: No papilledema.  Neck:     Musculoskeletal: Full passive range of motion without pain, normal range of motion and neck supple.     Thyroid: No thyroid mass.     Vascular: No carotid bruit.  Cardiovascular:     Rate and Rhythm: Normal rate and regular rhythm.     Pulses: Normal pulses.     Heart sounds: Normal heart sounds. No murmur.  Pulmonary:     Effort: Pulmonary effort is normal.     Breath sounds: Normal breath sounds.  Abdominal:     General: Abdomen is flat. Bowel sounds are normal.     Palpations: Abdomen is soft.  Musculoskeletal: Normal range of motion.        General: No swelling.     Right lower leg: No edema.     Left lower leg: No edema.  Skin:    General: Skin is warm and dry.     Capillary Refill: Capillary refill takes less than 2 seconds.  Neurological:     General: No focal deficit present.     Mental Status: She is alert and oriented to  person, place, and time.     Cranial Nerves: No cranial nerve deficit.     Sensory: No sensory deficit.  Psychiatric:        Mood and Affect: Mood normal.        Behavior: Behavior normal.        Thought Content: Thought content normal.        Judgment: Judgment normal.         Assessment And Plan:     1. Health maintenance examination . Behavior modifications discussed and diet history reviewed.   . Pt will continue to exercise regularly  and modify diet with low GI, plant based foods and decrease intake of processed foods.  . Recommend intake of daily multivitamin, Vitamin D, and calcium.  . Recommend mammogram (done in February per patient at Dr. Rose Fillers office) and colonoscopy (scheduled for April) for preventive screenings, as well as recommend immunizations that include influenza, TDAP (done today)  2. Encounter for immunization  Will give tetanus vaccine today while in office. Refer to order management. TDAP will be administered to adults 4-3 years old every 10 years. - Tdap (BOOSTRIX) injection 0.5 mL  3. Class 3 severe obesity due to excess calories without serious comorbidity with body mass index (BMI) of 45.0 to 49.9 in adult [facility])  Chronic  Discussed healthy diet and regular exercise options   Encouraged to exercise at least 150 minutes per week with 2 days of strength training  Advised if interested in weight loss can discuss at later visit on options.    I have advised to increase physical activity and limit intake of high carbohydrate foods.   4. Gastroesophageal reflux disease without esophagitis  Intermittent episodes of heart burn  Has taken omeprazole in the past will send a Rx  - Omeprazole 20 MG TBEC; Take 1 tablet (20 mg total) by mouth daily.  Dispense: 90 tablet; Refill: 1  5. Essential hypertension . B/P is stable. . CMP ordered to check renal function.  . The importance of regular exercise and dietary modification was stressed to the patient.   . Stressed importance of losing ten percent of her body weight to help with B/P control.  . The weight loss would help with decreasing cardiac and cancer risk as well.  . EKG done with NSR - EKG 12-Lead  COVID-19 Education: The signs and symptoms of COVID-19 were discussed with the patient and how to seek care for testing (follow up with PCP or arrange E-visit).  The importance of social distancing was discussed today.   Patient Risk:   After full review of this patients clinical status, I feel that they are at least moderate risk at this time.     Minette Brine, FNP

## 2018-04-14 ENCOUNTER — Ambulatory Visit: Payer: Managed Care, Other (non HMO) | Admitting: Nurse Practitioner

## 2018-04-14 ENCOUNTER — Telehealth: Payer: Self-pay

## 2018-04-14 NOTE — Telephone Encounter (Signed)
1st attempt to give results 

## 2018-04-14 NOTE — Telephone Encounter (Signed)
-----   Message from Minette Brine, [place] sent at 04/13/2018 12:12 PM EDT -----  Your HgbA1c is 6.1 slightly better than 3 months ago, be sure to increase your physical activity and avoid sugary foods and drinks.  I think we need to increase your metformin to twice a day.  Are you interested in an injectable medication for your diabetes, it may also help with weight loss? Kidney and liver functions are normal. Blood levels and cholesterol levels are normal.

## 2018-04-17 NOTE — Progress Notes (Signed)
She can go to [email or web address] and review the information provided.  She will need to let me know what she would like to do in the near future or at her next office visit in 3 months.

## 2018-06-02 LAB — HM COLONOSCOPY

## 2018-06-07 ENCOUNTER — Encounter: Payer: Self-pay | Admitting: Nurse Practitioner

## 2018-06-09 ENCOUNTER — Other Ambulatory Visit: Payer: Self-pay | Admitting: Nurse Practitioner

## 2018-08-07 ENCOUNTER — Other Ambulatory Visit: Payer: Self-pay | Admitting: Nurse Practitioner

## 2018-08-16 ENCOUNTER — Other Ambulatory Visit: Payer: Self-pay | Admitting: Nurse Practitioner

## 2018-08-16 ENCOUNTER — Ambulatory Visit: Payer: Managed Care, Other (non HMO) | Admitting: Nurse Practitioner

## 2018-08-16 ENCOUNTER — Other Ambulatory Visit: Payer: Self-pay

## 2018-08-16 ENCOUNTER — Ambulatory Visit (INDEPENDENT_AMBULATORY_CARE_PROVIDER_SITE_OTHER): Payer: Managed Care, Other (non HMO) | Admitting: Nurse Practitioner

## 2018-08-16 ENCOUNTER — Encounter: Payer: Self-pay | Admitting: Nurse Practitioner

## 2018-08-16 VITALS — BP 138/92 | HR 87 | Ht 59.0 in | Wt 234.0 lb

## 2018-08-16 DIAGNOSIS — E559 Vitamin D deficiency, unspecified: Secondary | ICD-10-CM | POA: Diagnosis not present

## 2018-08-16 DIAGNOSIS — R7303 Prediabetes: Secondary | ICD-10-CM

## 2018-08-16 DIAGNOSIS — Z6841 Body Mass Index (BMI) 40.0 and over, adult: Secondary | ICD-10-CM

## 2018-08-16 DIAGNOSIS — K219 Gastro-esophageal reflux disease without esophagitis: Secondary | ICD-10-CM | POA: Diagnosis not present

## 2018-08-16 DIAGNOSIS — I10 Essential (primary) hypertension: Secondary | ICD-10-CM | POA: Diagnosis not present

## 2018-08-16 MED ORDER — OZEMPIC (0.25 OR 0.5 MG/DOSE) 2 MG/1.5ML ~~LOC~~ SOPN: 0.5000 mg | PEN_INJECTOR | SUBCUTANEOUS | 6 refills | Status: DC

## 2018-08-16 NOTE — Progress Notes (Signed)
Virtual Visit via Video   This visit type was conducted due to national recommendations for restrictions regarding the COVID-19 Pandemic (e.g. social distancing) in an effort to limit this patient's exposure and mitigate transmission in our community.  Due to her co-morbid illnesses, this patient is at least at moderate risk for complications without adequate follow up.  This format is felt to be most appropriate for this patient at this time.  All issues noted in this document were discussed and addressed.  A limited physical exam was performed with this format.    This visit type was conducted due to national recommendations for restrictions regarding the COVID-19 Pandemic (e.g. social distancing) in an effort to limit this patient's exposure and mitigate transmission in our community.  Patients identity confirmed using two different identifiers.  This format is felt to be most appropriate for this patient at this time.  All issues noted in this document were discussed and addressed.  No physical exam was performed (except for noted visual exam findings with Video Visits).    Date:  08/16/2018   ID:  Mary Odonnell, DOB [birth year], MRN [id number]  Patient Location:  Home - spoke with Ronald Pippins  Provider location:   Office    Chief Complaint:  Hypertension  History of Present Illness:    Mary Odonnell is a 53 y.o. female who presents via video conferencing for a telehealth visit today.    The patient does not have symptoms concerning for COVID-19 infection (fever, chills, cough, or new shortness of breath).   Reports right hand "wrinkling" and cold hands  Hypertension This is a chronic problem. The current episode started more than 1 year ago. The problem is uncontrolled. Pertinent negatives include no anxiety. Risk factors for coronary artery disease include obesity and sedentary lifestyle. Past treatments include calcium channel blockers and angiotensin blockers. There are no  compliance problems.  There is no history of angina. There is no history of chronic renal disease.  Diabetes Pertinent negatives for hypoglycemia include no dizziness.     Past Medical History:  Diagnosis Date  . Abnormal finding on imaging   . Diabetes mellitus without complication ([place])   . History of uterine fibroid   . Hypertension   . Menstrual periods irregular   . Pre-diabetes   . Vitamin D deficiency   . Wears glasses    Past Surgical History:  Procedure Laterality Date  . CERVICAL CERCLAGE  04/ 01/ 2003  and 2000  . DILATATION & CURRETTAGE/HYSTEROSCOPY WITH RESECTOCOPE N/A 03/30/2016   Procedure: DILATATION & CURETTAGE/HYSTEROSCOPY WITH RESECTOCOPE WITH BIPOLAR RESECTION;  Surgeon: Eldred Manges, MD;  Location: [place];  Service: Gynecology;  Laterality: N/A;  needs Largest Graves Speculum  . DILITATION & CURRETTAGE/HYSTROSCOPY WITH HYDROTHERMAL ABLATION N/A 03/30/2016   Procedure: DILATATION & CURETTAGE/HYSTEROSCOPY WITH HYDROTHERMAL ABLATION;  Surgeon: Eldred Manges, MD;  Location: [place];  Service: Gynecology;  Laterality: N/A;  . TONSILLECTOMY  age 65  . TRANSTHORACIC ECHOCARDIOGRAM  04/01/2010   mild LVH, ef 65-70%     Current Meds  Medication Sig  . amLODipine (NORVASC) 10 MG tablet TAKE 1 TABLET BY MOUTH  EVERY DAY  . cetirizine (ZYRTEC) 10 MG tablet TAKE 1 TABLET BY MOUTH  EVERY DAY  . Cholecalciferol (VITAMIN D-3) 1000 units CAPS Take 1 capsule by mouth daily.  . furosemide (LASIX) 20 MG tablet TAKE 1 TABLET BY MOUTH  DAILY  . losartan (COZAAR) 100 MG tablet TAKE  1 TABLET BY MOUTH  EVERY DAY  . metFORMIN (GLUCOPHAGE-XR) 750 MG 24 hr tablet TAKE 1 TABLET BY MOUTH  DAILY WITH EVENING MEAL     Allergies:   Patient has no known allergies.   Social History   Tobacco Use  . Smoking status: Never Smoker  . Smokeless tobacco: Never Used  Substance Use Topics  . Alcohol use: Yes    Alcohol/week: 1.0 standard drinks     Types: 1 Standard drinks or equivalent per week  . Drug use: No     Family Hx: The patient's family history includes Emphysema in her maternal grandfather; Hypertension in her mother.  ROS:   Please see the history of present illness.    Review of Systems  Constitutional: Negative.   Respiratory: Negative.   Cardiovascular: Negative.   Musculoskeletal: Negative.   Neurological: Negative.  Negative for dizziness and tingling.  Psychiatric/Behavioral: Negative.     All other systems reviewed and are negative.   Labs/Other Tests and Data Reviewed:    Recent Labs: 10/11/2017: TSH 1.110 04/11/2018: ALT 21; BUN 9; Creatinine, Ser 0.76; Hemoglobin 14.0; Platelets 299; Potassium 4.2; Sodium 142   Recent Lipid Panel Lab Results  Component Value Date/Time   CHOL 157 04/11/2018 09:52 AM   TRIG 80 04/11/2018 09:52 AM   HDL 53 04/11/2018 09:52 AM   CHOLHDL 3.0 04/11/2018 09:52 AM   LDLCALC 88 04/11/2018 09:52 AM    Wt Readings from Last 3 Encounters:  08/16/18 234 lb (106.1 kg)  04/11/18 235 lb 9.6 oz (106.9 kg)  01/13/18 240 lb 12.8 oz (109.2 kg)     Exam:    Vital Signs:  BP (!) 138/92 (BP Location: Right Arm, Patient Position: Sitting, Cuff Size: Large)   Pulse 87   Ht 4\' 11"  (1.499 m)   Wt 234 lb (106.1 kg)   LMP 01/24/2016 (Approximate)   BMI 47.26 kg/m     Physical Exam  Constitutional: She is oriented to person, place, and time. No distress.  Pulmonary/Chest: Effort normal. No respiratory distress.  Neurological: She is alert and oriented to person, place, and time.  Psychiatric: Mood, memory, affect and judgment normal.    ASSESSMENT & PLAN:    1. Essential hypertension . B/P is elevated today, feels this is related to her stress from her job.  . I have advised her to check her blood pressure the next few days until Monday to see if is better when she is more relaxed.   . CMP ordered to check renal function.  - CMP14 + Anion Gap; Future  2. Prediabetes   Chronic, controlled  Continue with current medications, will add Ozempic 0.25mg  titrating up to 0.5 mg weekly  Will check HgbA1c on Monday  Encouraged to limit intake of sugary foods and drinks  Encouraged to increase physical activity to 150 minutes per week - Semaglutide,0.25 or 0.5MG /DOS, (OZEMPIC, 0.25 OR 0.5 MG/DOSE,) 2 MG/1.5ML SOPN; Inject 0.5 mg into the skin once a week.  Dispense: 1 pen; Refill: 6 - Hemoglobin A1c; Future - CMP14 + Anion Gap; Future  3. Gastroesophageal reflux disease without esophagitis  She feels this has resolved since she has not been eating late and changing the types of foods she is eating  4. Vitamin D deficiency  Will check vitamin D level and supplement as needed.     Also encouraged to spend 15 minutes in the sun daily.  - Vitamin D (25 hydroxy)  5. Class 3 severe obesity due to  excess calories without serious comorbidity with body mass index (BMI) of 45.0 to 49.9 in adult [facility]) Chronic Discussed healthy diet and regular exercise options  Encouraged to exercise at least 150 minutes per week with 2 days of strength training    COVID-19 Education: The signs and symptoms of COVID-19 were discussed with the patient and how to seek care for testing (follow up with PCP or arrange E-visit).  The importance of social distancing was discussed today.  Patient Risk:   After full review of this patients clinical status, I feel that they are at least moderate risk at this time.  Time:   Today, I have spent 18 minutes/ seconds with the patient with telehealth technology discussing above diagnoses.     Medication Adjustments/Labs and Tests Ordered: Current medicines are reviewed at length with the patient today.  Concerns regarding medicines are outlined above.   Tests Ordered: Orders Placed This Encounter  Procedures  . Hemoglobin A1c  . CMP14 + Anion Gap  . Vitamin D (25 hydroxy)    Medication Changes: Meds ordered this encounter   Medications  . Semaglutide,0.25 or 0.5MG /DOS, (OZEMPIC, 0.25 OR 0.5 MG/DOSE,) 2 MG/1.5ML SOPN    Sig: Inject 0.5 mg into the skin once a week.    Dispense:  1 pen    Refill:  6    Disposition:  Follow up in 3 month(s)  Signed, Minette Brine, FNP

## 2018-08-20 ENCOUNTER — Other Ambulatory Visit: Payer: Self-pay | Admitting: Nurse Practitioner

## 2018-08-21 ENCOUNTER — Ambulatory Visit: Payer: Managed Care, Other (non HMO)

## 2018-08-21 ENCOUNTER — Other Ambulatory Visit: Payer: Self-pay

## 2018-08-21 ENCOUNTER — Other Ambulatory Visit: Payer: Managed Care, Other (non HMO)

## 2018-08-21 VITALS — Temp 98.2°F | Ht 59.0 in | Wt 236.0 lb

## 2018-08-21 DIAGNOSIS — I10 Essential (primary) hypertension: Secondary | ICD-10-CM

## 2018-08-21 DIAGNOSIS — R7303 Prediabetes: Secondary | ICD-10-CM

## 2018-08-22 ENCOUNTER — Other Ambulatory Visit: Payer: Self-pay | Admitting: Nurse Practitioner

## 2018-08-22 LAB — CMP14 + ANION GAP
ALT: 22 IU/L (ref 0–32)
AST: 19 IU/L (ref 0–40)
Albumin/Globulin Ratio: 1.4 (ref 1.2–2.2)
Albumin: 4.2 g/dL (ref 3.8–4.9)
Alkaline Phosphatase: 73 IU/L (ref 39–117)
Anion Gap: 17 mmol/L (ref 10.0–18.0)
BUN/Creatinine Ratio: 15 (ref 9–23)
BUN: 11 mg/dL (ref 6–24)
Bilirubin Total: 0.3 mg/dL (ref 0.0–1.2)
CO2: 22 mmol/L (ref 20–29)
Calcium: 9.3 mg/dL (ref 8.7–10.2)
Chloride: 102 mmol/L (ref 96–106)
Creatinine, Ser: 0.73 mg/dL (ref 0.57–1.00)
GFR calc Af Amer: 110 mL/min/{1.73_m2} (ref >59)
GFR calc non Af Amer: 96 mL/min/{1.73_m2} (ref >59)
Globulin, Total: 3 g/dL (ref 1.5–4.5)
Glucose: 106 mg/dL — ABNORMAL HIGH (ref 65–99)
Potassium: 4.2 mmol/L (ref 3.5–5.2)
Sodium: 141 mmol/L (ref 134–144)
Total Protein: 7.2 g/dL (ref 6.0–8.5)

## 2018-08-22 LAB — HEMOGLOBIN A1C
Est. average glucose Bld gHb Est-mCnc: 128 mg/dL
Hgb A1c MFr Bld: 6.1 % — ABNORMAL HIGH (ref 4.8–5.6)

## 2018-10-30 ENCOUNTER — Encounter: Payer: Self-pay | Admitting: Nurse Practitioner

## 2018-11-07 ENCOUNTER — Other Ambulatory Visit: Payer: Self-pay

## 2018-11-07 MED ORDER — METFORMIN HCL ER 750 MG PO TB24: ORAL_TABLET | ORAL | 0 refills | Status: DC

## 2018-11-08 ENCOUNTER — Ambulatory Visit: Payer: Managed Care, Other (non HMO) | Admitting: Nurse Practitioner

## 2018-11-19 ENCOUNTER — Other Ambulatory Visit: Payer: Self-pay | Admitting: Nurse Practitioner

## 2018-11-21 ENCOUNTER — Other Ambulatory Visit: Payer: Self-pay

## 2018-11-21 DIAGNOSIS — R7303 Prediabetes: Secondary | ICD-10-CM

## 2018-11-21 MED ORDER — OZEMPIC (0.25 OR 0.5 MG/DOSE) 2 MG/1.5ML ~~LOC~~ SOPN: 0.5000 mg | PEN_INJECTOR | SUBCUTANEOUS | 1 refills | Status: DC

## 2018-12-05 ENCOUNTER — Other Ambulatory Visit: Payer: Self-pay | Admitting: Nurse Practitioner

## 2018-12-28 ENCOUNTER — Encounter: Payer: Self-pay | Admitting: Nurse Practitioner

## 2018-12-28 ENCOUNTER — Telehealth: Payer: Self-pay

## 2018-12-28 NOTE — Telephone Encounter (Signed)
I called patient to see if the pharmacy has given her a new prescription for metformin. I left her a v/m to call the office [facility]

## 2019-01-10 ENCOUNTER — Encounter: Payer: Self-pay | Admitting: Nurse Practitioner

## 2019-01-20 ENCOUNTER — Other Ambulatory Visit: Payer: Self-pay | Admitting: Nurse Practitioner

## 2019-02-02 ENCOUNTER — Other Ambulatory Visit: Payer: Self-pay | Admitting: Nurse Practitioner

## 2019-03-15 ENCOUNTER — Other Ambulatory Visit: Payer: Self-pay | Admitting: Nurse Practitioner

## 2019-03-21 LAB — HM PAP SMEAR

## 2019-03-21 LAB — HM MAMMOGRAPHY

## 2019-03-22 ENCOUNTER — Encounter: Payer: Self-pay | Admitting: Nurse Practitioner

## 2019-03-23 ENCOUNTER — Encounter: Payer: Self-pay | Admitting: Nurse Practitioner

## 2019-04-19 ENCOUNTER — Encounter: Payer: Managed Care, Other (non HMO) | Admitting: Nurse Practitioner

## 2019-06-19 IMAGING — US US ABDOMEN COMPLETE
1 series · 14 of 25 positions shown · non-contrast
Comparison: None.

CLINICAL DATA: Right upper quadrant pain

EXAM:
ABDOMEN ULTRASOUND COMPLETE

[Series 1: us abdomen complete · 0.26mm/px · 14 of 94 slices shown]
[im 1/94]
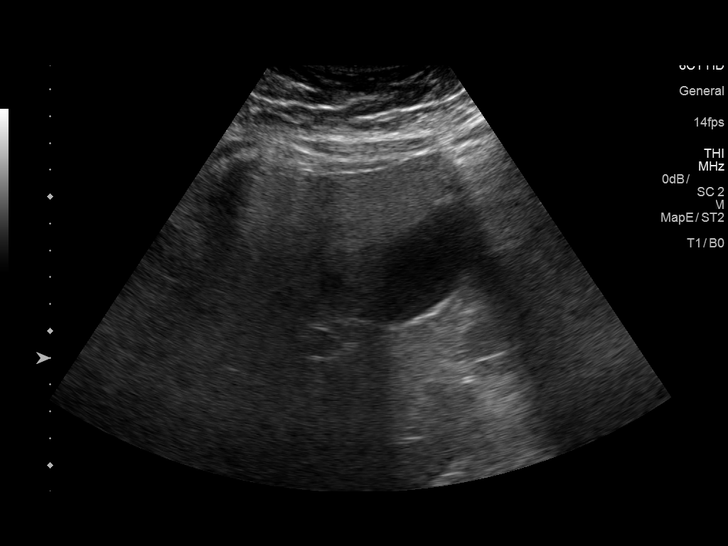
[im 8/94]
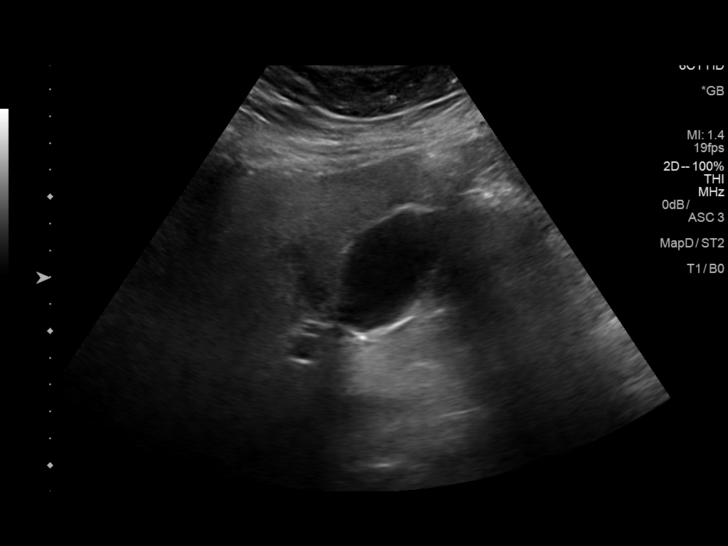
[im 16/94]
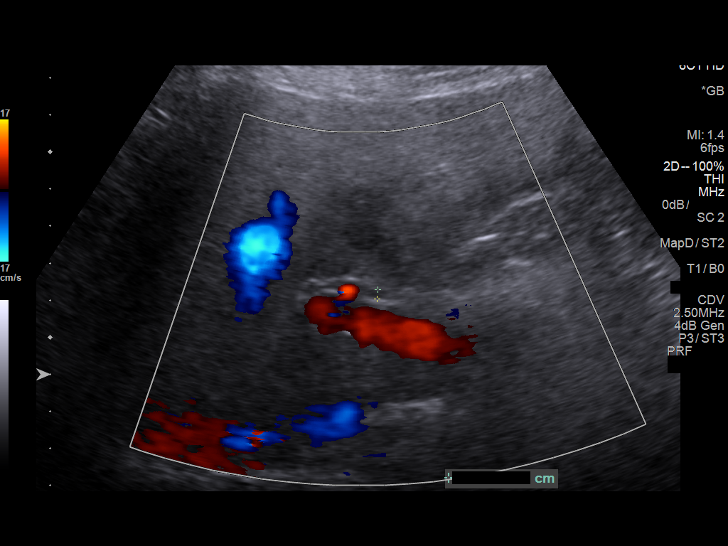
[im 24/94]
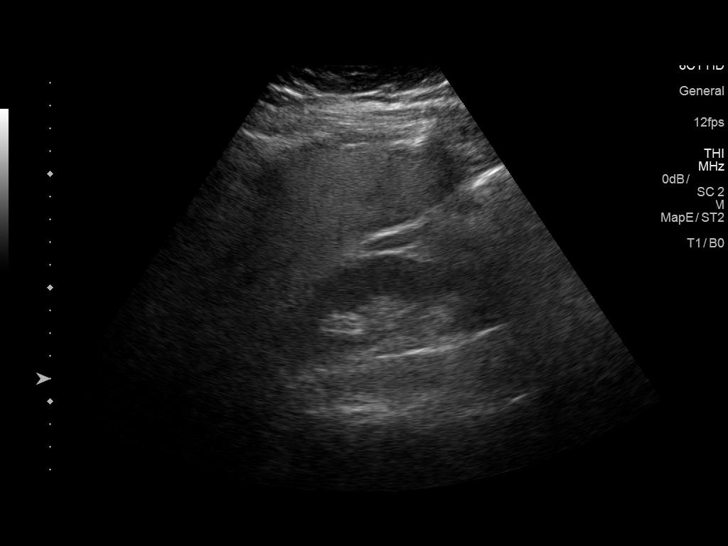
[im 32/94]
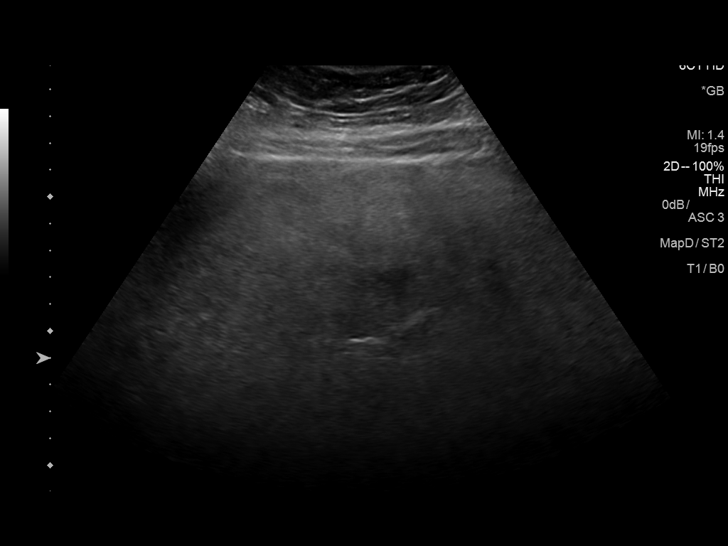
[im 35/94]
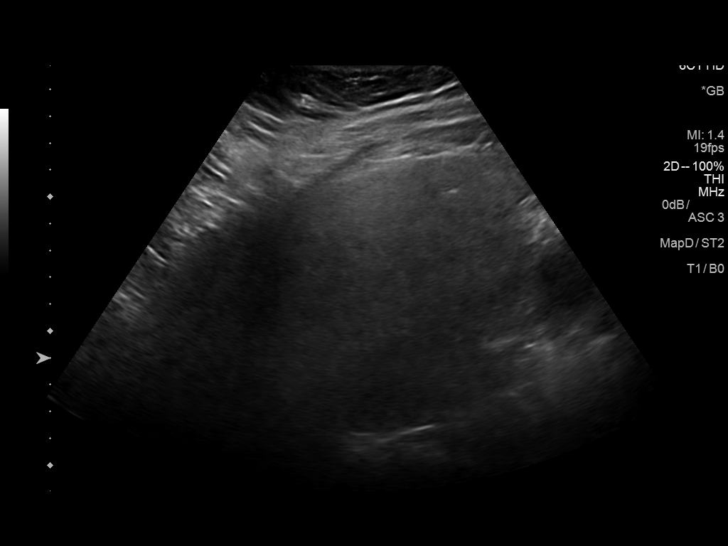
[im 43/94]
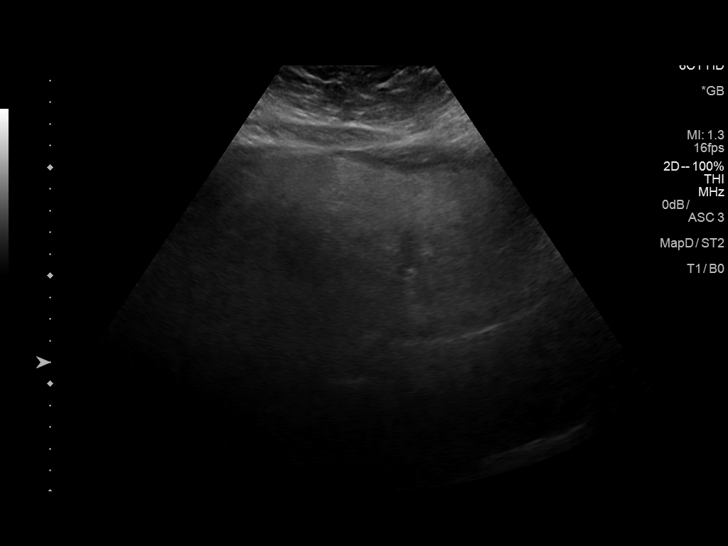
[im 51/94]
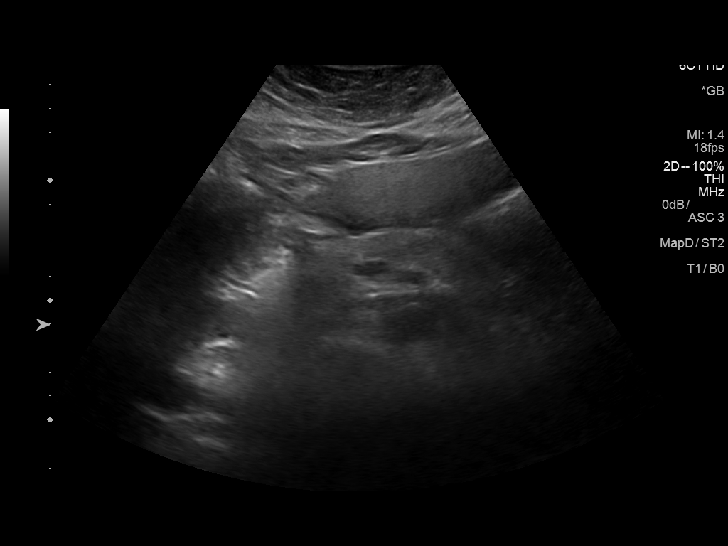
[im 59/94]
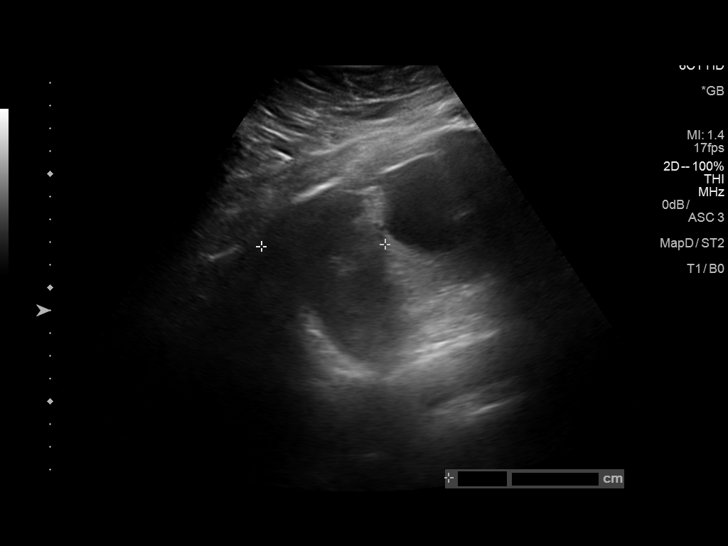
[im 63/94]
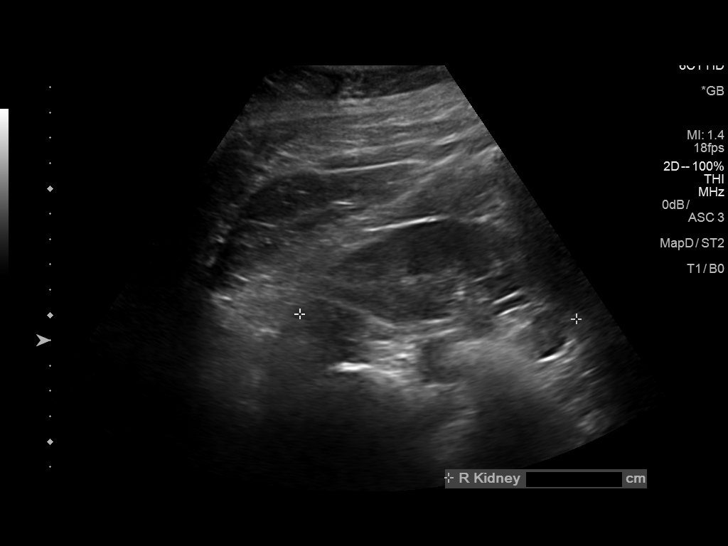
[im 70/94]
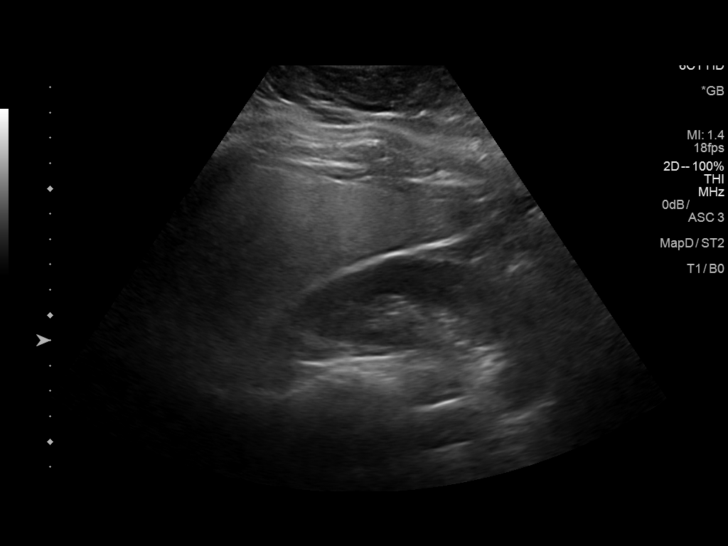
[im 78/94]
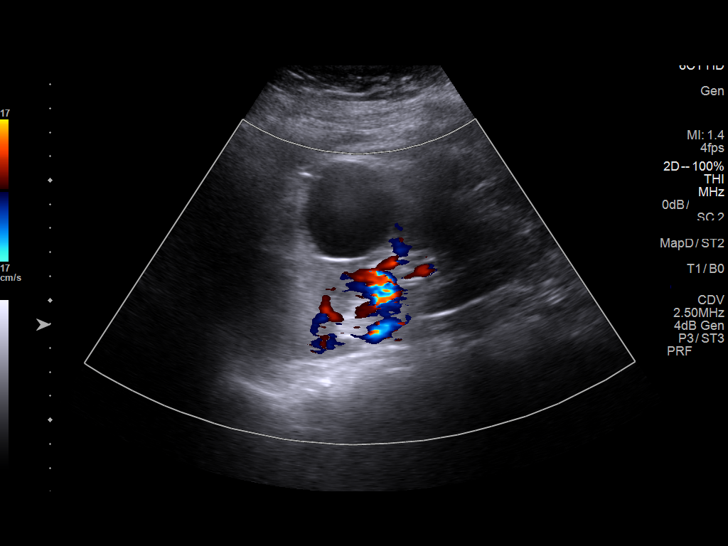
[im 86/94]
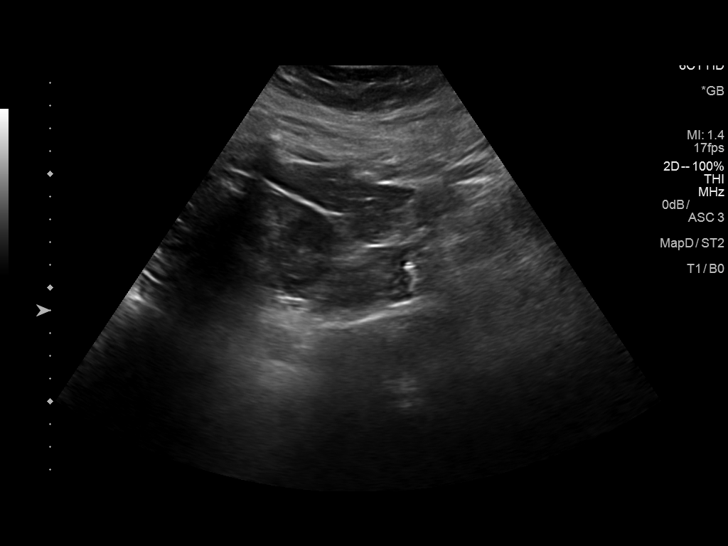
[im 94/94]
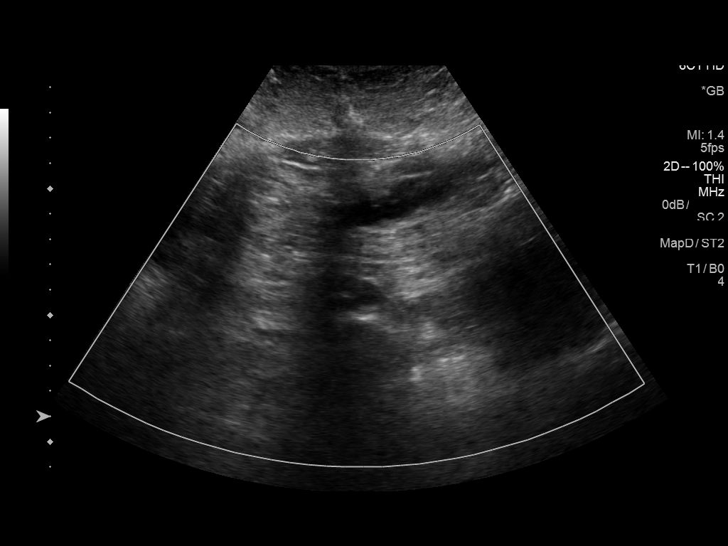

[14 of 25 positions shown; findings below may reference images not displayed]

FINDINGS: Gallbladder: No gallstones or wall thickening visualized. No
sonographic Murphy sign noted by sonographer.

Common bile duct: Diameter: 2.5 mm

Liver: Liver parenchyma diffusely echogenic compatible with fatty
infiltration. No focal liver lesion. Portal vein is patent on color
Doppler imaging with normal direction of blood flow towards the
liver.

IVC: No abnormality visualized.

Pancreas: Visualized portion unremarkable.

Spleen: Size and appearance within normal limits.

Right Kidney: Length: 10.9 cm. Echogenicity within normal limits. No
mass or hydronephrosis visualized.

Left Kidney: Length: 11.7 cm. 4.5 cm midpole cyst. Echogenicity
within normal limits. No mass or hydronephrosis visualized.

Abdominal aorta: No aneurysm visualized.

Other findings: None.
IMPRESSION: Fatty infiltration of the liver.  Negative for gallstones.

## 2019-07-07 ENCOUNTER — Other Ambulatory Visit: Payer: Self-pay | Admitting: Nurse Practitioner

## 2019-07-23 ENCOUNTER — Other Ambulatory Visit: Payer: Self-pay | Admitting: Nurse Practitioner

## 2019-09-07 ENCOUNTER — Other Ambulatory Visit: Payer: Self-pay | Admitting: Nurse Practitioner

## 2019-09-07 DIAGNOSIS — R7303 Prediabetes: Secondary | ICD-10-CM

## 2019-09-23 ENCOUNTER — Other Ambulatory Visit: Payer: Self-pay | Admitting: Nurse Practitioner

## 2019-09-23 DIAGNOSIS — R7303 Prediabetes: Secondary | ICD-10-CM

## 2019-10-24 ENCOUNTER — Other Ambulatory Visit: Payer: Self-pay | Admitting: Nurse Practitioner

## 2019-11-28 ENCOUNTER — Encounter (HOSPITAL_COMMUNITY): Payer: Self-pay | Admitting: Emergency Medicine

## 2019-11-28 ENCOUNTER — Ambulatory Visit (HOSPITAL_COMMUNITY)
Admission: EM | Admit: 2019-11-28 | Discharge: 2019-11-28 | Disposition: A | Discharge status: Home / Self Care | Payer: Managed Care, Other (non HMO) | Attending: Family Medicine | Admitting: Family Medicine

## 2019-11-28 ENCOUNTER — Other Ambulatory Visit: Payer: Self-pay

## 2019-11-28 DIAGNOSIS — M7061 Trochanteric bursitis, right hip: Secondary | ICD-10-CM | POA: Diagnosis not present

## 2019-11-28 LAB — POC URINE PREG, ED: Preg Test, Ur: NEGATIVE

## 2019-11-28 MED ORDER — DEXAMETHASONE SODIUM PHOSPHATE 10 MG/ML IJ SOLN
INTRAMUSCULAR | Status: AC
  Filled 2019-11-28: qty 1

## 2019-11-28 MED ORDER — DEXAMETHASONE SODIUM PHOSPHATE 10 MG/ML IJ SOLN
10.0000 mg | Freq: Once | INTRAMUSCULAR | Status: AC
  Administered 2019-11-28: 10 mg via INTRAMUSCULAR

## 2019-11-28 NOTE — ED Triage Notes (Signed)
Pt presents with hip and low back pain xs 3 weeks. States urine are been a little cloudy. C/o of minor abdominal pain, bloating, and gas.

## 2019-12-01 NOTE — ED Provider Notes (Signed)
[facility]   [id number] 11/28/19 Arrival Time: 8563  ASSESSMENT & PLAN:  1. Greater trochanteric bursitis of right hip      Able to ambulate here and hemodynamically stable. No indication for imaging of back at this time given no trauma and normal neurological exam. Discussed.  Given: Meds ordered this encounter  Medications  . dexamethasone (DECADRON) injection 10 mg   Encourage ROM/movement as tolerated.  Recommend:  Follow-up Information    [facility].   Why: If your hip is worsening or failing to improve as anticipated. Contact information: [address] [place] [phone]              Reviewed expectations re: course of current medical issues. Questions answered. Outlined signs and symptoms indicating need for more acute intervention. Patient verbalized understanding. After Visit Summary given.   SUBJECTIVE: History from: patient.  Mary Odonnell is a 54 y.o. female who presents with complaint of fairly persistent non-radiating low back and R hip pain; 2-3 weeks; no trauma/injury. Does get better at times but overall feels this is worsening. No extremity sensation changes or weakness. Distant h/o similar. Questions slightly cloudy urine; no dysuria or gross hematuria. No abd pain. Normal PO intake without n/v/d. OTC analgesics without much relief.  Reports no chronic steroid use, fevers, IV drug use, or recent back surgeries or procedures.  Patient's last menstrual period was 01/24/2016 (approximate).   OBJECTIVE:  Vitals:   11/28/19 1644  BP: (!) 159/98  Pulse: 84  Resp: 19  Temp: (!) 97.2 F (36.2 C)  TempSrc: Oral  SpO2: 99%    General appearance: alert; no distress HEENT: [place]; AT Lungs: unlabored respirations; speaks full sentences without difficulty Abdomen: soft, non-tender; non-distended Back: mild  and poorly localized tenderness to palpation over R lumbar  musculature; FROM at waist; bruising: none; without midline tenderness Extremities: very TTP over R trochanteric bursa Skin: warm and dry Neurologic: normal gait; normal sensation and strength of bilateral LE Psychological: alert and cooperative; normal mood and affect  Labs: Results for orders placed or performed during the hospital encounter of 11/28/19  POC urine preg, ED (not at [facility])  Result Value Ref Range   Preg Test, Ur NEGATIVE NEGATIVE    No Known Allergies  Past Medical History:  Diagnosis Date  . Abnormal finding on imaging   . Diabetes mellitus without complication ([place])   . History of uterine fibroid   . Hypertension   . Menstrual periods irregular   . Pre-diabetes   . Vitamin D deficiency   . Wears glasses    Social History   Socioeconomic History  . Marital status: Married    Spouse name: Not on file  . Number of children: Not on file  . Years of education: Not on file  . Highest education level: Not on file  Occupational History  . Not on file  Tobacco Use  . Smoking status: Never Smoker  . Smokeless tobacco: Never Used  Substance and Sexual Activity  . Alcohol use: Yes    Alcohol/week: 1.0 standard drink    Types: 1 Standard drinks or equivalent per week  . Drug use: No  . Sexual activity: Yes    Partners: Male    Birth control/protection: Other-see comments    Comment: husband -- vasectomy   Other Topics Concern  . Not on file  Social History Narrative  . Not on file   Social Determinants of  Health   Financial Resource Strain:   . Difficulty of Paying Living Expenses: Not on file  Food Insecurity:   . Worried About Charity fundraiser in the Last Year: Not on file  . Ran Out of Food in the Last Year: Not on file  Transportation Needs:   . Lack of Transportation (Medical): Not on file  . Lack of Transportation (Non-Medical): Not on file  Physical Activity:   . Days of Exercise per Week: Not on file  . Minutes of Exercise per Session:  Not on file  Stress:   . Feeling of Stress : Not on file  Social Connections:   . Frequency of Communication with Friends and Family: Not on file  . Frequency of Social Gatherings with Friends and Family: Not on file  . Attends Religious Services: Not on file  . Active Member of Clubs or Organizations: Not on file  . Attends Archivist Meetings: Not on file  . Marital Status: Not on file  Intimate Partner Violence:   . Fear of Current or Ex-Partner: Not on file  . Emotionally Abused: Not on file  . Physically Abused: Not on file  . Sexually Abused: Not on file   Family History  Problem Relation Age of Onset  . Hypertension Mother   . Emphysema Maternal Grandfather    Past Surgical History:  Procedure Laterality Date  . CERVICAL CERCLAGE  04/ 01/ 2003  and 2000  . DILATATION & CURRETTAGE/HYSTEROSCOPY WITH RESECTOCOPE N/A 03/30/2016   Procedure: DILATATION & CURETTAGE/HYSTEROSCOPY WITH RESECTOCOPE WITH BIPOLAR RESECTION;  Surgeon: Eldred Manges, MD;  Location: [place];  Service: Gynecology;  Laterality: N/A;  needs Largest Graves Speculum  . DILITATION & CURRETTAGE/HYSTROSCOPY WITH HYDROTHERMAL ABLATION N/A 03/30/2016   Procedure: DILATATION & CURETTAGE/HYSTEROSCOPY WITH HYDROTHERMAL ABLATION;  Surgeon: Eldred Manges, MD;  Location: [place];  Service: Gynecology;  Laterality: N/A;  . TONSILLECTOMY  age 83  . TRANSTHORACIC ECHOCARDIOGRAM  04/01/2010   mild LVH, ef 65-70%     Vanessa Kick, MD 12/01/19 [id number]

## 2020-06-12 ENCOUNTER — Encounter (INDEPENDENT_AMBULATORY_CARE_PROVIDER_SITE_OTHER): Payer: Self-pay | Admitting: Family Medicine

## 2020-06-12 ENCOUNTER — Ambulatory Visit (INDEPENDENT_AMBULATORY_CARE_PROVIDER_SITE_OTHER): Payer: Managed Care, Other (non HMO) | Admitting: Family Medicine

## 2020-06-12 ENCOUNTER — Other Ambulatory Visit: Payer: Self-pay

## 2020-06-12 VITALS — BP 139/89 | HR 79 | Temp 97.7°F | Ht <= 58 in | Wt 225.0 lb

## 2020-06-12 DIAGNOSIS — I10 Essential (primary) hypertension: Secondary | ICD-10-CM

## 2020-06-12 DIAGNOSIS — Z6841 Body Mass Index (BMI) 40.0 and over, adult: Secondary | ICD-10-CM

## 2020-06-12 DIAGNOSIS — Z9189 Other specified personal risk factors, not elsewhere classified: Secondary | ICD-10-CM | POA: Insufficient documentation

## 2020-06-12 DIAGNOSIS — R5383 Other fatigue: Secondary | ICD-10-CM

## 2020-06-12 DIAGNOSIS — K760 Fatty (change of) liver, not elsewhere classified: Secondary | ICD-10-CM

## 2020-06-12 DIAGNOSIS — E559 Vitamin D deficiency, unspecified: Secondary | ICD-10-CM

## 2020-06-12 DIAGNOSIS — Z1331 Encounter for screening for depression: Secondary | ICD-10-CM

## 2020-06-12 DIAGNOSIS — R0602 Shortness of breath: Secondary | ICD-10-CM | POA: Diagnosis not present

## 2020-06-12 DIAGNOSIS — R0683 Snoring: Secondary | ICD-10-CM

## 2020-06-12 DIAGNOSIS — R7303 Prediabetes: Secondary | ICD-10-CM

## 2020-06-12 DIAGNOSIS — Z0289 Encounter for other administrative examinations: Secondary | ICD-10-CM

## 2020-06-13 LAB — COMPREHENSIVE METABOLIC PANEL
ALT: 19 IU/L (ref 0–32)
AST: 14 IU/L (ref 0–40)
Albumin/Globulin Ratio: 1.4 (ref 1.2–2.2)
Albumin: 4.3 g/dL (ref 3.8–4.9)
Alkaline Phosphatase: 70 IU/L (ref 44–121)
BUN/Creatinine Ratio: 16 (ref 9–23)
BUN: 12 mg/dL (ref 6–24)
Bilirubin Total: 0.6 mg/dL (ref 0.0–1.2)
CO2: 24 mmol/L (ref 20–29)
Calcium: 9.4 mg/dL (ref 8.7–10.2)
Chloride: 101 mmol/L (ref 96–106)
Creatinine, Ser: 0.73 mg/dL (ref 0.57–1.00)
Globulin, Total: 3.1 g/dL (ref 1.5–4.5)
Glucose: 89 mg/dL (ref 65–99)
Potassium: 4.3 mmol/L (ref 3.5–5.2)
Sodium: 139 mmol/L (ref 134–144)
Total Protein: 7.4 g/dL (ref 6.0–8.5)
eGFR: 98 mL/min/{1.73_m2} (ref >59)

## 2020-06-13 LAB — FOLATE: Folate: 11.7 ng/mL (ref >3.0)

## 2020-06-13 LAB — CBC WITH DIFFERENTIAL/PLATELET
Basophils Absolute: 0 10*3/uL (ref 0.0–0.2)
Basos: 1 %
EOS (ABSOLUTE): 0.1 10*3/uL (ref 0.0–0.4)
Eos: 1 %
Hematocrit: 43.7 % (ref 34.0–46.6)
Hemoglobin: 14 g/dL (ref 11.1–15.9)
Immature Grans (Abs): 0 10*3/uL (ref 0.0–0.1)
Immature Granulocytes: 0 %
Lymphocytes Absolute: 2.8 10*3/uL (ref 0.7–3.1)
Lymphs: 59 %
MCH: 26.9 pg (ref 26.6–33.0)
MCHC: 32 g/dL (ref 31.5–35.7)
MCV: 84 fL (ref 79–97)
Monocytes Absolute: 0.4 10*3/uL (ref 0.1–0.9)
Monocytes: 8 %
Neutrophils Absolute: 1.4 10*3/uL (ref 1.4–7.0)
Neutrophils: 31 %
Platelets: 365 10*3/uL (ref 150–450)
RBC: 5.21 x10E6/uL (ref 3.77–5.28)
RDW: 13.6 % (ref 11.7–15.4)
WBC: 4.7 10*3/uL (ref 3.4–10.8)

## 2020-06-13 LAB — HEMOGLOBIN A1C
Est. average glucose Bld gHb Est-mCnc: 134 mg/dL
Hgb A1c MFr Bld: 6.3 % — ABNORMAL HIGH (ref 4.8–5.6)

## 2020-06-13 LAB — LIPID PANEL
Chol/HDL Ratio: 2.8 ratio (ref 0.0–4.4)
Cholesterol, Total: 170 mg/dL (ref 100–199)
HDL: 61 mg/dL (ref >39)
LDL Chol Calc (NIH): 93 mg/dL (ref 0–99)
Triglycerides: 84 mg/dL (ref 0–149)
VLDL Cholesterol Cal: 16 mg/dL (ref 5–40)

## 2020-06-13 LAB — INSULIN, RANDOM: INSULIN: 13.2 u[IU]/mL (ref 2.6–24.9)

## 2020-06-13 LAB — VITAMIN B12: Vitamin B-12: 816 pg/mL (ref 232–1245)

## 2020-06-13 LAB — VITAMIN D 25 HYDROXY (VIT D DEFICIENCY, FRACTURES): Vit D, 25-Hydroxy: 25.4 ng/mL — ABNORMAL LOW (ref 30.0–100.0)

## 2020-06-13 LAB — TSH: TSH: 1.07 u[IU]/mL (ref 0.450–4.500)

## 2020-06-13 LAB — T4, FREE: Free T4: 1.23 ng/dL (ref 0.82–1.77)

## 2020-06-24 NOTE — Progress Notes (Signed)
Dear Mary Odonnell,   Thank you for referring Lauralyn Shadowens Odonnell to our clinic. The following note includes my evaluation and treatment recommendations.  Chief Complaint:   Mary Odonnell (MR# [id number]) is a 55 y.o. female who presents for evaluation and treatment of obesity and related comorbidities. Current BMI is Body mass index is 47.03 kg/m. Mary Odonnell has been struggling with her weight for many years and has been unsuccessful in either losing weight, maintaining weight loss, or reaching her healthy weight goal.  Mary Odonnell is currently in the action stage of change and ready to dedicate time achieving and maintaining a healthier weight. Mary Odonnell is interested in becoming our patient and working on intensive lifestyle modifications including (but not limited to) diet and exercise for weight loss.  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  Lives with her husband, Remo Lipps, and 2 daughters, ages 70 and 48, and mother-in-law, who is 41 years old.  Craves fruits, ice cream, and rice, especially between 9-10 pm.  Snacks on nuts, crackers, popcorn, and fruit.  Worst habit is eating a quart of ice cream at a time.  Mary Odonnell's habits were reviewed today and are as follows: Her family eats meals together, she thinks her family will eat healthier with her, she struggles with family and or coworkers weight loss sabotage, her desired weight loss is 80 pounds, she has been heavy most of her life, she started gaining weight in 1994, her heaviest weight ever was 242 pounds, she is a picky eater and doesn't like to eat healthier foods, she craves fruits, ice cream, and rice, she skips breakfast frequently, she is frequently drinking liquids with calories, she frequently makes poor food choices, and she struggles with emotional eating.  Depression Screen Mary Odonnell Food and Mood (modified PHQ-9) score was 11.  Depression screen PHQ 2/9 06/12/2020  Decreased Interest 1  Down, Depressed, Hopeless 1  PHQ - 2 Score 2  Altered  sleeping 2  Tired, decreased energy 2  Change in appetite 2  Feeling bad or failure about yourself  1  Trouble concentrating 1  Moving slowly or fidgety/restless 1  Suicidal thoughts 0  PHQ-9 Score 11  Difficult doing work/chores Somewhat difficult   Assessment/Plan:   Orders Placed This Encounter  Procedures   Vitamin B12   CBC with Differential/Platelet   Comprehensive metabolic panel   Folate   Hemoglobin A1c   Insulin, random   Lipid panel   T4, free   TSH   VITAMIN D 25 Hydroxy (Vit-D Deficiency, Fractures)   EKG 12-Lead   Medications Discontinued During This Encounter  Medication Reason   Semaglutide,0.25 or 0.5MG /DOS, (OZEMPIC, 0.25 OR 0.5 MG/DOSE,) 2 MG/1.5ML SOPN     1. Other fatigue Mary Odonnell admits to daytime somnolence and reports waking up still tired. Patent has a history of symptoms of daytime fatigue, morning fatigue, morning headache, and snoring. Mary Odonnell generally gets 6 or 7 hours of sleep per night, and states that she has poor quality sleep. Snoring is present. Apneic episodes are not present. Epworth Sleepiness Score is 7.  Mary Odonnell does feel that her weight is causing her energy to be lower than it should be. Fatigue may be related to obesity, depression or many other causes. Labs will be ordered, and in the meanwhile, Mary Odonnell will focus on self care including making healthy food choices, increasing physical activity and focusing on stress reduction.  Check EKG and labs.  - EKG 12-Lead - Vitamin B12 - CBC with Differential/Platelet - Comprehensive  metabolic panel - Folate - Lipid panel - T4, free - TSH  2. SOBOE (shortness of breath on exertion) Mary Odonnell notes increasing shortness of breath with exercising and seems to be worsening over time with weight gain. She notes getting out of breath sooner with activity than she used to. This has gotten worse recently. Mary Odonnell denies shortness of breath at rest or orthopnea.  Mary Odonnell does feel that she gets out of  breath more easily that she used to when she exercises. Mary Odonnell shortness of breath appears to be obesity related and exercise induced. She has agreed to work on weight loss and gradually increase exercise to treat her exercise induced shortness of breath. Will continue to monitor closely.  Check IC today.  3. Prediabetes Not at goal. Goal is HgbA1c < 5.7.  Medication: metformin XR 750 mg daily.  Diagnosed 7-10 years ago.  Started metformin early on in diagnoses.  Plan:  She will continue to focus on protein-rich, low simple carbohydrate foods. We reviewed the importance of hydration, regular exercise for stress reduction, and restorative sleep.  Will check labs today.  Continue metformin for now.  Lab Results  Component Value Date   HGBA1C 6.3 (H) 06/12/2020   Lab Results  Component Value Date   INSULIN 13.2 06/12/2020   - Comprehensive metabolic panel - Hemoglobin A1c - Insulin, random  4. Essential hypertension Medications: Norvasc 10 mg daily, losartan 100 mg daily, furosemide 20 mg daily.  Diagnosed over 23 years ago.  Blood pressure not checked at home much but if she does, it is controlled and at goal.  Plan:  Not at goal today - higher than ideal of <130/80.  Avoid buying foods that are: processed, frozen, or prepackaged to avoid excess salt.  Start prudent nutritional plan and weight loss.  Will check labs today. Ambulatory blood pressure monitoring was encouraged with a goal of at least 2-3 times weekly or when feeling poorly.  She was instructed to keep a log for [place] to review at each office visit.    BP Readings from Last 3 Encounters:  06/12/20 139/89  11/28/19 (!) 159/98  08/16/18 (!) 138/92   Lab Results  Component Value Date   CREATININE 0.73 06/12/2020   - Vitamin B12 - CBC with Differential/Platelet - Comprehensive metabolic panel - Folate - Lipid panel - T4, free - TSH  5. Nonalcoholic hepatosteatosis She was diagnosed 4-5 years ago via [place] of abdomen.    Plan:  Will check labs today.  NAFLD is an umbrella term that encompasses a disease spectrum that includes steatosis (fat) without inflammation, steatohepatitis (NASH; fat + inflammation in a characteristic pattern), and cirrhosis. Bland steatosis is felt to be a benign condition, with extremely low to no risk of progression to cirrhosis, whereas NASH can progress to cirrhosis. The mainstay of treatment of NAFLD includes lifestyle modification to achieve weight loss, at least 7% of current body weight. Low carbohydrate diets can be beneficial in improving NAFLD liver histology. Additionally, exercise, even the absence of weight loss can have beneficial effects on the patient's metabolic profile and liver health.   - Comprehensive metabolic panel  6. Snoring, excessive No CPAP.  She started with evaluation but never finished with sleep study.  She did not feel sleep study was needed.  No concerns currently.  Plan:  educated her on ESS score and disease process.  Will monitor closely and she will follow-up with  if she feels she needs to.  7. Vitamin D deficiency Optimal  goal > 50 ng/dL. She is taking OTC vitamin D 1,000 IU daily.  She endorses fatigue and tiredness.  Plan:  Will check vitamin D level today.  - VITAMIN D 25 Hydroxy (Vit-D Deficiency, Fractures)  8. Depression screening Mary Odonnell was screened for depression as part of her new patient workup today.  PHQ-9 is 11.  Denies depression or concerns with emotional eating at current.  Mary Odonnell had a positive depression screening. Depression is commonly associated with obesity and often results in emotional eating behaviors. We will monitor this closely and work on CBT to help improve the non-hunger eating patterns. Referral to Psychology may be required if no improvement is seen as she continues in our clinic.  9. At risk for diabetes mellitus - Mary Odonnell was given diabetes prevention education and counseling today of more than 15 minutes.   - Counseled patient on pathophysiology of disease and meaning/ implication of lab results.  - Reviewed how certain foods can either stimulate or inhibit insulin release, and subsequently affect hunger pathways  - Importance of following a healthy meal plan with limiting amounts of simple carbohydrates discussed with patient - Effects of regular aerobic exercise on blood sugar regulation reviewed and encouraged an eventual goal of 30 min 5d/week or more as a minimum.  - Briefly discussed treatment options, which always include dietary and lifestyle modification as first line.   - Handouts provided at patient's desire and/or told to go online to the American Diabetes Association website for further information.  10. Class 3 severe obesity with serious comorbidity and body mass index (BMI) of 45.0 to 49.9 in adult, unspecified obesity type [facility])  Mary Odonnell is currently in the action stage of change and her goal is to continue with weight loss efforts. I recommend Mary Odonnell begin the structured treatment plan as follows:  She has agreed to the Category 1 Plan.  Exercise goals:  As is.    Behavioral modification strategies: decreasing liquid calories, meal planning and cooking strategies, keeping healthy foods in the home, and planning for success.  She was informed of the importance of frequent follow-up visits to maximize her success with intensive lifestyle modifications for her multiple health conditions. She was informed we would discuss her lab results at her next visit unless there is a critical issue that needs to be addressed sooner. Mary Odonnell agreed to keep her next visit at the agreed upon time to discuss these results.  Objective:   Blood pressure 139/89, pulse 79, temperature 97.7 F (36.5 C), height 4\' 10"  (1.473 m), weight 225 lb (102.1 kg), last menstrual period 01/24/2016, SpO2 97 %. Body mass index is 47.03 kg/m.  EKG: Normal sinus rhythm, rate 78 bpm.  Indirect Calorimeter completed  today shows a VO2 of 181 and a REE of 1259.  Her calculated basal metabolic rate is 4431 thus her basal metabolic rate is worse than expected.  General: Cooperative, alert, well developed, in no acute distress. HEENT: Conjunctivae and lids unremarkable. Cardiovascular: Regular rhythm.  Lungs: Normal work of breathing. Neurologic: No focal deficits.   Lab Results  Component Value Date   CREATININE 0.73 06/12/2020   BUN 12 06/12/2020   NA 139 06/12/2020   K 4.3 06/12/2020   CL 101 06/12/2020   CO2 24 06/12/2020   Lab Results  Component Value Date   ALT 19 06/12/2020   AST 14 06/12/2020   ALKPHOS 70 06/12/2020   BILITOT 0.6 06/12/2020   Lab Results  Component Value Date   HGBA1C 6.3 (H)  06/12/2020   HGBA1C 6.1 (H) 08/21/2018   HGBA1C 6.1 (H) 04/11/2018   HGBA1C 6.2 (H) 01/13/2018   HGBA1C 6.1 (H) 10/11/2017   Lab Results  Component Value Date   INSULIN 13.2 06/12/2020   Lab Results  Component Value Date   TSH 1.070 06/12/2020   Lab Results  Component Value Date   CHOL 170 06/12/2020   HDL 61 06/12/2020   LDLCALC 93 06/12/2020   TRIG 84 06/12/2020   CHOLHDL 2.8 06/12/2020   Lab Results  Component Value Date   WBC 4.7 06/12/2020   HGB 14.0 06/12/2020   HCT 43.7 06/12/2020   MCV 84 06/12/2020   PLT 365 06/12/2020   Attestation Statements:   This is the patient's first visit at [facility]. The patient's NEW PATIENT PACKET was reviewed at length. Included in the packet: current and past health history, medications, allergies, ROS, gynecologic history (women only), surgical history, family history, social history, weight history, weight loss surgery history (for those that have had weight loss surgery), nutritional evaluation, mood and food questionnaire, PHQ9, Epworth questionnaire, sleep habits questionnaire, patient life and health improvement goals questionnaire. These will all be scanned into the patient's chart under media.   During the visit,  I independently reviewed the patient's EKG, bioimpedance scale results, and indirect calorimeter results. I used this information to tailor a meal plan for the patient that will help her to lose weight and will improve her obesity-related conditions going forward. I performed a medically necessary appropriate examination and/or evaluation. I discussed the assessment and treatment plan with the patient. The patient was provided an opportunity to ask questions and all were answered. The patient agreed with the plan and demonstrated an understanding of the instructions. Labs were ordered at this visit and will be reviewed at the next visit unless more critical results need to be addressed immediately. Clinical information was updated and documented in the EMR.   I, Water quality scientist, CMA, am acting as Location manager for [employer], DO.  I have reviewed the above documentation for accuracy and completeness, and I agree with the above. Marjory Sneddon, D.O.  The [place] was signed into law in 2016 which includes the topic of electronic health records.  This provides immediate access to information in MyChart.  This includes consultation notes, operative notes, office notes, lab results and pathology reports.  If you have any questions about what you read please let us know at your next visit so we can discuss your concerns and take corrective action if need be.  We are right here with you.

## 2020-06-26 ENCOUNTER — Ambulatory Visit (INDEPENDENT_AMBULATORY_CARE_PROVIDER_SITE_OTHER): Payer: Managed Care, Other (non HMO) | Admitting: Family Medicine

## 2020-07-03 ENCOUNTER — Ambulatory Visit (INDEPENDENT_AMBULATORY_CARE_PROVIDER_SITE_OTHER): Payer: Managed Care, Other (non HMO) | Admitting: Family Medicine

## 2020-07-03 ENCOUNTER — Encounter (INDEPENDENT_AMBULATORY_CARE_PROVIDER_SITE_OTHER): Payer: Self-pay | Admitting: Family Medicine

## 2020-07-03 ENCOUNTER — Other Ambulatory Visit: Payer: Self-pay

## 2020-07-03 VITALS — BP 140/83 | HR 88 | Temp 98.1°F | Ht <= 58 in | Wt 218.0 lb

## 2020-07-03 DIAGNOSIS — E559 Vitamin D deficiency, unspecified: Secondary | ICD-10-CM

## 2020-07-03 DIAGNOSIS — Z9189 Other specified personal risk factors, not elsewhere classified: Secondary | ICD-10-CM | POA: Diagnosis not present

## 2020-07-03 DIAGNOSIS — R7303 Prediabetes: Secondary | ICD-10-CM | POA: Diagnosis not present

## 2020-07-03 DIAGNOSIS — I10 Essential (primary) hypertension: Secondary | ICD-10-CM

## 2020-07-03 DIAGNOSIS — K760 Fatty (change of) liver, not elsewhere classified: Secondary | ICD-10-CM | POA: Diagnosis not present

## 2020-07-03 DIAGNOSIS — E860 Dehydration: Secondary | ICD-10-CM

## 2020-07-03 DIAGNOSIS — Z6841 Body Mass Index (BMI) 40.0 and over, adult: Secondary | ICD-10-CM

## 2020-07-03 MED ORDER — VITAMIN D (ERGOCALCIFEROL) 1.25 MG (50000 UNIT) PO CAPS: 50000.0000 [IU] | ORAL_CAPSULE | ORAL | 0 refills | Status: DC

## 2020-07-10 NOTE — Progress Notes (Signed)
Chief Complaint:   OBESITY Mary Odonnell is here to discuss her progress with her obesity treatment plan along with follow-up of her obesity related diagnoses.   Today's visit was #: 2 Starting weight: 225 lbs Starting date: 06/12/2020 Today's weight: 218 lbs Today's date: 07/03/2020 Weight change since last visit: 7 lbs Total lbs lost to date: 7 lbs Body mass index is 45.56 kg/m.  Total weight loss percentage to date: -3.11%  Interim History:  Mary Odonnell is here today for her first follow-up office visit since starting the program with [place].  All blood work/ lab tests that were recently ordered by myself or an outside provider were reviewed with patient today per their request.   Extended time was spent counseling her on all new disease processes that were discovered or preexisting ones that are worsening.  she understands that many of these abnormalities will need to monitored regularly along with the current treatment plan of prudent dietary changes, in which we are making each and every office visit, to improve these health parameters.  Mary Odonnell went away to [facility] for a [place] weekend and ate out the entire time.  She uses her snack calories on blueberries, apples, and watermelon.  Drinking 40 ounces of water per day.  Current Meal Plan: the Category 1 Plan for 80% of the time.  Current Exercise Plan: Walking for 20 minutes 2-3 times per week.  Assessment/Plan:   Meds ordered this encounter  Medications   Vitamin D, Ergocalciferol, (DRISDOL) 1.25 MG (50000 UNIT) CAPS capsule    Sig: Take 1 capsule (50,000 Units total) by mouth every 7 (seven) days.    Dispense:  4 capsule    Refill:  0    30 d supply; OV for RF    1. Essential hypertension Not optimized. Medications: Norvasc 10 mg daily, losartan 100 mg daily, and Lasix 20 mg daily.  At home, blood pressure runs 130s/80s.  Plan:  Goal is 130/80.  Follow prudent nutritional plan and weight loss.  Check blood pressure at home.  Avoid  buying foods that are: processed, frozen, or prepackaged to avoid excess salt. We will continue to monitor closely alongside her PCP and/or Specialist.  Regular follow up with PCP and specialists was also encouraged.   BP Readings from Last 3 Encounters:  07/03/20 140/83  06/12/20 139/89  11/28/19 (!) 159/98   Lab Results  Component Value Date   CREATININE 0.73 06/12/2020   2. Prediabetes Not at goal. Goal is HgbA1c < 5.7.  Medication: metformin 750 mg daily.  Elevated fasting insulin.  A1c 6.3.  Plan:  Worsening.  Discussed labs with patient today.  A1c not at goal.  Continue metformin.  She will continue to focus on protein-rich, low simple carbohydrate foods. We reviewed the importance of hydration, regular exercise for stress reduction, and restorative sleep.   Lab Results  Component Value Date   HGBA1C 6.3 (H) 06/12/2020   Lab Results  Component Value Date   INSULIN 13.2 06/12/2020   3. NAFLD (nonalcoholic fatty liver disease) Discussed labs with patient today.  Labs are stable.  NAFLD is an umbrella term that encompasses a disease spectrum that includes steatosis (fat) without inflammation, steatohepatitis (NASH; fat + inflammation in a characteristic pattern), and cirrhosis. Bland steatosis is felt to be a benign condition, with extremely low to no risk of progression to cirrhosis, whereas NASH can progress to cirrhosis. The mainstay of treatment of NAFLD includes lifestyle modification to achieve weight loss, at  least 7% of current body weight. Low carbohydrate diets can be beneficial in improving NAFLD liver histology. Additionally, exercise, even the absence of weight loss can have beneficial effects on the patient's metabolic profile and liver health.   4. Mild dehydration Elevated sodium level and BUN.  Plan:  Discussed labs with patient today.  Increase water intake to a goal of 100 ounces per day.  5. Vitamin D deficiency Not at goal.  She is taking OTC vitamin D  1,000 IU daily.  Plan: New.  Discussed labs with patient today.  Start to take prescription Vitamin D @50 ,000 IU every week as prescribed.  Follow-up for routine testing of Vitamin D, at least 2-3 times per year to avoid over-replacement.  Lab Results  Component Value Date   VD25OH 25.4 (L) 06/12/2020   - Start Vitamin D, Ergocalciferol, (DRISDOL) 1.25 MG (50000 UNIT) CAPS capsule; Take 1 capsule (50,000 Units total) by mouth every 7 (seven) days.  Dispense: 4 capsule; Refill: 0  6. At risk for diabetes mellitus - Mary Odonnell was given diabetes prevention education and counseling today of more than 22 minutes.  - Counseled patient on pathophysiology of disease and meaning/ implication of lab results.  - Reviewed how certain foods can either stimulate or inhibit insulin release, and subsequently affect hunger pathways  - Importance of following a healthy meal plan with limiting amounts of simple carbohydrates discussed with patient - Effects of regular aerobic exercise on blood sugar regulation reviewed and encouraged an eventual goal of 30 min 5d/week or more as a minimum.  - Briefly discussed treatment options, which always include dietary and lifestyle modification as first line.   - Handouts provided at patient's desire and/or told to go online to the American Diabetes Association website for further information.  7. Obesity, current BMI 45.7  Course: Mary Odonnell is currently in the action stage of change. As such, her goal is to continue with weight loss efforts.   Nutrition goals: She has agreed to the Category 1 Plan.   Exercise goals:  As is.  Behavioral modification strategies: increasing water intake, decreasing liquid calories, better snacking choices, and planning for success.  [name] has agreed to follow-up with our clinic in 2-3 weeks. She was informed of the importance of frequent follow-up visits to maximize her success with intensive lifestyle modifications for her multiple health  conditions.   Objective:   Blood pressure 140/83, pulse 88, temperature 98.1 F (36.7 C), height 4\' 10"  (1.473 m), weight 218 lb (98.9 kg), last menstrual period 01/24/2016, SpO2 96 %. Body mass index is 45.56 kg/m.  General: Cooperative, alert, well developed, in no acute distress. HEENT: Conjunctivae and lids unremarkable. Cardiovascular: Regular rhythm.  Lungs: Normal work of breathing. Neurologic: No focal deficits.   Lab Results  Component Value Date   CREATININE 0.73 06/12/2020   BUN 12 06/12/2020   NA 139 06/12/2020   K 4.3 06/12/2020   CL 101 06/12/2020   CO2 24 06/12/2020   Lab Results  Component Value Date   ALT 19 06/12/2020   AST 14 06/12/2020   ALKPHOS 70 06/12/2020   BILITOT 0.6 06/12/2020   Lab Results  Component Value Date   HGBA1C 6.3 (H) 06/12/2020   HGBA1C 6.1 (H) 08/21/2018   HGBA1C 6.1 (H) 04/11/2018   HGBA1C 6.2 (H) 01/13/2018   HGBA1C 6.1 (H) 10/11/2017   Lab Results  Component Value Date   INSULIN 13.2 06/12/2020   Lab Results  Component Value Date   TSH 1.070  06/12/2020   Lab Results  Component Value Date   CHOL 170 06/12/2020   HDL 61 06/12/2020   LDLCALC 93 06/12/2020   TRIG 84 06/12/2020   CHOLHDL 2.8 06/12/2020   Lab Results  Component Value Date   VD25OH 25.4 (L) 06/12/2020   Lab Results  Component Value Date   WBC 4.7 06/12/2020   HGB 14.0 06/12/2020   HCT 43.7 06/12/2020   MCV 84 06/12/2020   PLT 365 06/12/2020   Attestation Statements:   Reviewed by clinician on day of visit: allergies, medications, problem list, medical history, surgical history, family history, social history, and previous encounter notes.  I, Water quality scientist, CMA, am acting as Location manager for [employer], DO.  I have reviewed the above documentation for accuracy and completeness, and I agree with the above. Marjory Sneddon, D.O.  The [place] was signed into law in 2016 which includes the topic of electronic health  records.  This provides immediate access to information in MyChart.  This includes consultation notes, operative notes, office notes, lab results and pathology reports.  If you have any questions about what you read please let us know at your next visit so we can discuss your concerns and take corrective action if need be.  We are right here with you.

## 2020-07-23 ENCOUNTER — Other Ambulatory Visit: Payer: Self-pay

## 2020-07-23 ENCOUNTER — Ambulatory Visit (INDEPENDENT_AMBULATORY_CARE_PROVIDER_SITE_OTHER): Payer: Managed Care, Other (non HMO) | Admitting: Family Medicine

## 2020-07-23 VITALS — BP 134/88 | HR 84 | Temp 98.5°F | Ht <= 58 in | Wt 218.0 lb

## 2020-07-23 DIAGNOSIS — R7303 Prediabetes: Secondary | ICD-10-CM

## 2020-07-23 DIAGNOSIS — Z9189 Other specified personal risk factors, not elsewhere classified: Secondary | ICD-10-CM | POA: Diagnosis not present

## 2020-07-23 DIAGNOSIS — Z6841 Body Mass Index (BMI) 40.0 and over, adult: Secondary | ICD-10-CM | POA: Diagnosis not present

## 2020-07-23 DIAGNOSIS — E559 Vitamin D deficiency, unspecified: Secondary | ICD-10-CM | POA: Diagnosis not present

## 2020-07-23 MED ORDER — VITAMIN D (ERGOCALCIFEROL) 1.25 MG (50000 UNIT) PO CAPS: 50000.0000 [IU] | ORAL_CAPSULE | ORAL | 0 refills | Status: DC

## 2020-07-23 MED ORDER — METFORMIN HCL ER 750 MG PO TB24: ORAL_TABLET | ORAL | 0 refills | Status: DC

## 2020-07-30 ENCOUNTER — Other Ambulatory Visit (INDEPENDENT_AMBULATORY_CARE_PROVIDER_SITE_OTHER): Payer: Self-pay | Admitting: Family Medicine

## 2020-07-30 DIAGNOSIS — E559 Vitamin D deficiency, unspecified: Secondary | ICD-10-CM

## 2020-07-30 NOTE — Telephone Encounter (Signed)
Pt last seen by Dr. Opalski.  

## 2020-08-05 NOTE — Progress Notes (Signed)
Chief Complaint:   OBESITY Mary Odonnell is here to discuss her progress with her obesity treatment plan along with follow-up of her obesity related diagnoses.   Today's visit was #: 3 Starting weight: 225 lbs Starting date: 06/12/2020 Today's weight: 218 lbs Today's date: 07/23/2020 Weight change since last visit: 0 Total lbs lost to date: 7 lbs Body mass index is 45.56 kg/m.  Total weight loss percentage to date: -3.11%  Interim History:  Mary Odonnell says, "I did really good overall.  I ate a few things I shouldn't have eaten".  She has increased water intake to 70 ounces now.  When on plan, hunger and cravings are controlled.  Current Meal Plan: the Category 1 Plan for 75% of the time.  Current Exercise Plan: Walking for 20 minutes 3 times per week.  Assessment/Plan:   Medications Discontinued During This Encounter  Medication Reason   Cholecalciferol (VITAMIN D-3) 1000 units CAPS Error   metFORMIN (GLUCOPHAGE-XR) 750 MG 24 hr tablet Reorder   Vitamin D, Ergocalciferol, (DRISDOL) 1.25 MG (50000 UNIT) CAPS capsule Reorder   Meds ordered this encounter  Medications   metFORMIN (GLUCOPHAGE-XR) 750 MG 24 hr tablet    Sig: 1 po qd    Dispense:  30 tablet    Refill:  0   Vitamin D, Ergocalciferol, (DRISDOL) 1.25 MG (50000 UNIT) CAPS capsule    Sig: Take 1 capsule (50,000 Units total) by mouth every 7 (seven) days.    Dispense:  4 capsule    Refill:  0    30 d supply; OV for RF    1. Vitamin D deficiency Not at goal.  She is taking vitamin D 50,000 IU weekly.  Tolerating well, without issues.  Plan: Continue to take prescription Vitamin D '@50'$ ,000 IU every week as prescribed.  Follow-up for routine testing of Vitamin D, at least 2-3 times per year to avoid over-replacement.  Lab Results  Component Value Date   VD25OH 25.4 (L) 06/12/2020   Plan: - Discussed importance of vitamin D to their health and well-being.  - possible symptoms of low Vitamin D can be low energy, depressed  mood, muscle aches, joint aches, osteoporosis etc. - low Vitamin D levels may be linked to an increased risk of cardiovascular events and even increased risk of cancers- such as colon and breast.  - I recommend pt take weekly prescription vit D - see script below   - Informed patient this may be a lifelong thing, and she was encouraged to continue to take the medicine until told otherwise.   - we will need to monitor levels regularly (every 3-4 mo on average) to keep levels within normal limits.  - weight loss will likely improve availability of vitamin D, thus encouraged Mary Odonnell to continue with meal plan and their weight loss efforts to further improve this condition - pt's questions and concerns regarding this condition addressed.  2. Prediabetes Not at goal. Goal is HgbA1c < 5.7.  Medication: metformin XR 750 mg daily.    Plan:  She will continue to focus on protein-rich, low simple carbohydrate foods. We reviewed the importance of hydration, regular exercise for stress reduction, and restorative sleep.   Lab Results  Component Value Date   HGBA1C 6.3 (H) 06/12/2020   Lab Results  Component Value Date   INSULIN 13.2 06/12/2020   - Refill metFORMIN (GLUCOPHAGE-XR) 750 MG 24 hr tablet; 1 po qd  Dispense: 30 tablet; Refill: 0  3. At risk for diabetes mellitus -  Mary Odonnell was given diabetes prevention education and counseling today of more than 9 minutes.  - Counseled patient on pathophysiology of disease and meaning/ implication of lab results.  - Reviewed how certain foods can either stimulate or inhibit insulin release, and subsequently affect hunger pathways  - Importance of following a healthy meal plan with limiting amounts of simple carbohydrates discussed with patient - Effects of regular aerobic exercise on blood sugar regulation reviewed and encouraged an eventual goal of 30 min 5d/week or more as a minimum.  - Briefly discussed treatment options, which always include dietary and  lifestyle modification as first line.   - Handouts provided at patient's desire and/or told to go online to the American Diabetes Association website for further information.  4. Class 3 severe obesity with serious comorbidity and body mass index (BMI) of 45.0 to 49.9 in adult, unspecified obesity type ([place])  Course: Mary Odonnell is currently in the action stage of change. As such, her goal is to continue with weight loss efforts.   Nutrition goals: She has agreed to the Category 1 Plan.   Exercise goals:  As is.  Behavioral modification strategies: increasing lean protein intake, decreasing simple carbohydrates, decreasing eating out, and planning for success.  Mary Odonnell has agreed to follow-up with our clinic in 2-3 weeks. She was informed of the importance of frequent follow-up visits to maximize her success with intensive lifestyle modifications for her multiple health conditions.   Objective:   Blood pressure 134/88, pulse 84, temperature 98.5 F (36.9 C), height '4\' 10"'$  (1.473 m), weight 218 lb (98.9 kg), last menstrual period 01/24/2016, SpO2 99 %. Body mass index is 45.56 kg/m.  General: Cooperative, alert, well developed, in no acute distress. HEENT: Conjunctivae and lids unremarkable. Cardiovascular: Regular rhythm.  Lungs: Normal work of breathing. Neurologic: No focal deficits.   Lab Results  Component Value Date   CREATININE 0.73 06/12/2020   BUN 12 06/12/2020   NA 139 06/12/2020   K 4.3 06/12/2020   CL 101 06/12/2020   CO2 24 06/12/2020   Lab Results  Component Value Date   ALT 19 06/12/2020   AST 14 06/12/2020   ALKPHOS 70 06/12/2020   BILITOT 0.6 06/12/2020   Lab Results  Component Value Date   HGBA1C 6.3 (H) 06/12/2020   HGBA1C 6.1 (H) 08/21/2018   HGBA1C 6.1 (H) 04/11/2018   HGBA1C 6.2 (H) 01/13/2018   HGBA1C 6.1 (H) 10/11/2017   Lab Results  Component Value Date   INSULIN 13.2 06/12/2020   Lab Results  Component Value Date   TSH 1.070 06/12/2020    Lab Results  Component Value Date   CHOL 170 06/12/2020   HDL 61 06/12/2020   LDLCALC 93 06/12/2020   TRIG 84 06/12/2020   CHOLHDL 2.8 06/12/2020   Lab Results  Component Value Date   VD25OH 25.4 (L) 06/12/2020   Lab Results  Component Value Date   WBC 4.7 06/12/2020   HGB 14.0 06/12/2020   HCT 43.7 06/12/2020   MCV 84 06/12/2020   PLT 365 06/12/2020   Attestation Statements:   Reviewed by clinician on day of visit: allergies, medications, problem list, medical history, surgical history, family history, social history, and previous encounter notes.  I, Water quality scientist, CMA, am acting as Location manager for [employer], DO.  I have reviewed the above documentation for accuracy and completeness, and I agree with the above. Marjory Sneddon, D.O.  The 21st Century Cures Act was signed into law in 2016  which includes the topic of electronic health records.  This provides immediate access to information in MyChart.  This includes consultation notes, operative notes, office notes, lab results and pathology reports.  If you have any questions about what you read please let us know at your next visit so we can discuss your concerns and take corrective action if need be.  We are right here with you.

## 2020-08-11 ENCOUNTER — Other Ambulatory Visit: Payer: Self-pay

## 2020-08-11 ENCOUNTER — Encounter (INDEPENDENT_AMBULATORY_CARE_PROVIDER_SITE_OTHER): Payer: Self-pay | Admitting: Family Medicine

## 2020-08-11 ENCOUNTER — Ambulatory Visit (INDEPENDENT_AMBULATORY_CARE_PROVIDER_SITE_OTHER): Payer: Managed Care, Other (non HMO) | Admitting: Family Medicine

## 2020-08-11 VITALS — BP 135/84 | HR 71 | Temp 97.5°F | Ht <= 58 in | Wt 213.0 lb

## 2020-08-11 DIAGNOSIS — R7303 Prediabetes: Secondary | ICD-10-CM

## 2020-08-11 DIAGNOSIS — Z6841 Body Mass Index (BMI) 40.0 and over, adult: Secondary | ICD-10-CM

## 2020-08-11 DIAGNOSIS — Z9189 Other specified personal risk factors, not elsewhere classified: Secondary | ICD-10-CM | POA: Diagnosis not present

## 2020-08-11 DIAGNOSIS — E559 Vitamin D deficiency, unspecified: Secondary | ICD-10-CM | POA: Diagnosis not present

## 2020-08-11 MED ORDER — VITAMIN D (ERGOCALCIFEROL) 1.25 MG (50000 UNIT) PO CAPS: 50000.0000 [IU] | ORAL_CAPSULE | ORAL | 0 refills | Status: DC

## 2020-08-13 NOTE — Progress Notes (Signed)
Chief Complaint:   OBESITY Mary Odonnell is here to discuss her progress with her obesity treatment plan along with follow-up of her obesity related diagnoses. Mary Odonnell is on the Category 1 Plan and states she is following her eating plan approximately 70% of the time. Mary Odonnell states she is walking for 20 minutes 3 times per week.  Today's visit was #: 4 Starting weight: 225 lbs Starting date: 06/12/2020 Today's weight: 213 lbs Today's date: 08/11/2020 Total lbs lost to date: 12 lbs Total lbs lost since last in-office visit: 5 lbs  Interim History: Mary Odonnell will increase her water intake to 70 oz per day and she feel meal prepping has been more often priority for her.  Subjective:   1. Prediabetes Mary Odonnell has a diagnosis of prediabetes based on her elevated HgA1c and was informed this puts her at greater risk of developing diabetes. She continues to work on diet and exercise to decrease her risk of diabetes. She denies nausea or hypoglycemia.  2. Vitamin D deficiency Low Vitamin D level contributes to fatigue and are associated with obesity, breast, and colon cancer. She agrees to continue to take prescription Vitamin D '@50'$ ,000 IU every week and will follow-up for routine testing of Vitamin D, at least 2-3 times per year to avoid over-replacement.  3. At risk for dehydration Mary Odonnell is at risk for dehydration due to poor water intake.  Assessment/Plan:  No orders of the defined types were placed in this encounter.   Medications Discontinued During This Encounter  Medication Reason   Vitamin D, Ergocalciferol, (DRISDOL) 1.25 MG (50000 UNIT) CAPS capsule Reorder     Meds ordered this encounter  Medications   Vitamin D, Ergocalciferol, (DRISDOL) 1.25 MG (50000 UNIT) CAPS capsule    Sig: Take 1 capsule (50,000 Units total) by mouth every 7 (seven) days.    Dispense:  4 capsule    Refill:  0    30 d supply; OV for RF     1. Prediabetes Mary Odonnell will continue to work on weight loss,  exercise, and decreasing simple carbohydrates to help decrease the risk of diabetes.    2. Vitamin D deficiency Low Vitamin D level contributes to fatigue and are associated with obesity, breast, and colon cancer. We will refill prescription Vitamin D '@50'$ ,000 IU every week. Mary Odonnell will follow-up for routine testing of Vitamin D, at least 2-3 times per year to avoid over-replacement.  - Vitmin D, Ergocalciferol, (DRISDOL) 1.25 MG (50000 UNIT) CAPS capsule; Take 1 capsule (50,000 Units total) by mouth every 7 (seven) days.  Dispense: 4 capsule; Refill: 0  3. At risk for dehydration Mary Odonnell was given approximately 9 min minutes dehydration prevention counseling today. Mary Odonnell is at risk for dehydration due to weight loss and current medication(s). She was encouraged to hydrate and monitor fluid status to avoid dehydration as well as weight loss plateaus.    4. Obesity with current BMI of 44.7 Mary Odonnell is currently in the action stage of change. As such, her goal is to continue with weight loss efforts. She has agreed to the Category 1 Plan.   Mary Odonnell's goal is to increase H2O intake, keep healthy food in house and food prep/meal prep.   Exercise goals: All adults should avoid inactivity. Some physical activity is better than none, and adults who participate in any amount of physical activity gain some health benefits.  Behavioral modification strategies: increasing lean protein intake, meal planning and cooking strategies, and planning for success.  Mary Odonnell has agreed to  follow-up with our clinic in 2-3 weeks. She was informed of the importance of frequent follow-up visits to maximize her success with intensive lifestyle modifications for her multiple health conditions.   Objective:   Blood pressure 135/84, pulse 71, temperature (!) 97.5 F (36.4 C), height '4\' 10"'$  (1.473 m), weight 213 lb (96.6 kg), last menstrual period 01/24/2016, SpO2 99 %. Body mass index is 44.52 kg/m.  General:  Cooperative, alert, well developed, in no acute distress. HEENT: Conjunctivae and lids unremarkable. Cardiovascular: Regular rhythm.  Lungs: Normal work of breathing. Neurologic: No focal deficits.   Lab Results  Component Value Date   CREATININE 0.73 06/12/2020   BUN 12 06/12/2020   NA 139 06/12/2020   K 4.3 06/12/2020   CL 101 06/12/2020   CO2 24 06/12/2020   Lab Results  Component Value Date   ALT 19 06/12/2020   AST 14 06/12/2020   ALKPHOS 70 06/12/2020   BILITOT 0.6 06/12/2020   Lab Results  Component Value Date   HGBA1C 6.3 (H) 06/12/2020   HGBA1C 6.1 (H) 08/21/2018   HGBA1C 6.1 (H) 04/11/2018   HGBA1C 6.2 (H) 01/13/2018   HGBA1C 6.1 (H) 10/11/2017   Lab Results  Component Value Date   INSULIN 13.2 06/12/2020   Lab Results  Component Value Date   TSH 1.070 06/12/2020   Lab Results  Component Value Date   CHOL 170 06/12/2020   HDL 61 06/12/2020   LDLCALC 93 06/12/2020   TRIG 84 06/12/2020   CHOLHDL 2.8 06/12/2020   Lab Results  Component Value Date   VD25OH 25.4 (L) 06/12/2020   Lab Results  Component Value Date   WBC 4.7 06/12/2020   HGB 14.0 06/12/2020   HCT 43.7 06/12/2020   MCV 84 06/12/2020   PLT 365 06/12/2020   No results found for: IRON, TIBC, FERRITIN  Attestation Statements:   Reviewed by clinician on day of visit: allergies, medications, problem list, medical history, surgical history, family history, social history, and previous encounter notes.  I, Tonye Pearson, am acting as Location manager for [employer], DO.  I have reviewed the above documentation for accuracy and completeness, and I agree with the above. Marjory Sneddon, D.O.  The [place] was signed into law in 2016 which includes the topic of electronic health records.  This provides immediate access to information in MyChart.  This includes consultation notes, operative notes, office notes, lab results and pathology reports.  If you have any  questions about what you read please let us know at your next visit so we can discuss your concerns and take corrective action if need be.  We are right here with you.

## 2020-08-15 ENCOUNTER — Other Ambulatory Visit (INDEPENDENT_AMBULATORY_CARE_PROVIDER_SITE_OTHER): Payer: Self-pay | Admitting: Family Medicine

## 2020-08-15 DIAGNOSIS — E559 Vitamin D deficiency, unspecified: Secondary | ICD-10-CM

## 2020-08-18 NOTE — Telephone Encounter (Signed)
Dr.Opalski ?

## 2020-08-27 ENCOUNTER — Encounter (INDEPENDENT_AMBULATORY_CARE_PROVIDER_SITE_OTHER): Payer: Self-pay | Admitting: Family Medicine

## 2020-08-27 ENCOUNTER — Ambulatory Visit (INDEPENDENT_AMBULATORY_CARE_PROVIDER_SITE_OTHER): Payer: Managed Care, Other (non HMO) | Admitting: Family Medicine

## 2020-08-27 ENCOUNTER — Other Ambulatory Visit: Payer: Self-pay

## 2020-08-27 VITALS — BP 138/90 | HR 79 | Temp 97.8°F | Ht <= 58 in | Wt 215.0 lb

## 2020-08-27 DIAGNOSIS — E559 Vitamin D deficiency, unspecified: Secondary | ICD-10-CM | POA: Diagnosis not present

## 2020-08-27 DIAGNOSIS — Z6841 Body Mass Index (BMI) 40.0 and over, adult: Secondary | ICD-10-CM | POA: Diagnosis not present

## 2020-08-27 DIAGNOSIS — Z9189 Other specified personal risk factors, not elsewhere classified: Secondary | ICD-10-CM

## 2020-08-27 DIAGNOSIS — R7303 Prediabetes: Secondary | ICD-10-CM | POA: Diagnosis not present

## 2020-08-27 MED ORDER — VITAMIN D (ERGOCALCIFEROL) 1.25 MG (50000 UNIT) PO CAPS: 50000.0000 [IU] | ORAL_CAPSULE | ORAL | 0 refills | Status: DC

## 2020-08-27 MED ORDER — METFORMIN HCL ER 750 MG PO TB24
ORAL_TABLET | ORAL | 0 refills | Status: DC
Start: 2020-08-27 — End: 2020-10-16

## 2020-09-01 NOTE — Progress Notes (Signed)
Chief Complaint:   OBESITY Mary Odonnell is here to discuss her progress with her obesity treatment plan along with follow-up of her obesity related diagnoses. Mary Odonnell is on the Category 1 Plan and states she is following her eating plan approximately 70% of the time. Mary Odonnell states she is walking 20-25 minutes 3 times per week.  Today's visit was #: 5 Starting weight: 225 lbs Starting date: 06/12/2020 Today's weight: 215 lbs Today's date: 08/27/2020 Total lbs lost to date: 10 Total lbs lost since last in-office visit: +2  Interim History: Mary Odonnell traveled over the weekend and also celebrated her husbands birthday. She has no kids at home anymore, as her daughter just went off to college; thus pt thinks it will be easier to eat healthier now.  Assessment/Plan:  No orders of the defined types were placed in this encounter.   Medications Discontinued During This Encounter  Medication Reason   metFORMIN (GLUCOPHAGE-XR) 750 MG 24 hr tablet Reorder   Vitamin D, Ergocalciferol, (DRISDOL) 1.25 MG (50000 UNIT) CAPS capsule Reorder     Meds ordered this encounter  Medications   metFORMIN (GLUCOPHAGE-XR) 750 MG 24 hr tablet    Sig: 1 po qd    Dispense:  30 tablet    Refill:  0    Ov for RF   DISCONTD: Vitamin D, Ergocalciferol, (DRISDOL) 1.25 MG (50000 UNIT) CAPS capsule    Sig: Take 1 capsule (50,000 Units total) by mouth every 7 (seven) days.    Dispense:  4 capsule    Refill:  0    30 d supply; OV for RF     1. Pre-diabetes Not at goal. Goal is HgbA1c < 5.7.  Medication: Metformin.    Plan:  She will continue to focus on protein-rich, low simple carbohydrate foods. We reviewed the importance of hydration, regular exercise for stress reduction, and restorative sleep.   Lab Results  Component Value Date   HGBA1C 6.3 (H) 06/12/2020   Lab Results  Component Value Date   INSULIN 13.2 06/12/2020    Refill- metFORMIN (GLUCOPHAGE-XR) 750 MG 24 hr tablet; 1 po qd  Dispense: 30 tablet;  Refill: 0  2. Vitamin D deficiency Not at goal.   Plan: Continue to take prescription Vitamin D '@50'$ ,000 IU every week as prescribed.  Follow-up for routine testing of Vitamin D, at least 2-3 times per year to avoid over-replacement.  Lab Results  Component Value Date   VD25OH 25.4 (L) 06/12/2020    Refill- Vitamin D, Ergocalciferol, (DRISDOL) 1.25 MG (50000 UNIT) CAPS capsule; Take 1 capsule (50,000 Units total) by mouth every 7 (seven) days.  Dispense: 4 capsule; Refill: 0  3. At risk for constipation Mary Odonnell is at increased risk for constipation due to inadequate water intake, changes in diet, and/or use of certain medications. Patient was provided with 15 minutes of counseling today regarding this condition and related ones.  We discussed preventative OTC therapies that exist such as MiraLAX, stool softeners, etc.   We also discussed the importance of adequate fiber intake and for patient to drink 1/2 weight in ounces of water per day, unless told otherwise by a Cardiologist, Nephrologist, or another medical provider.    4. Obesity with current BMI of 45.0  Mary Odonnell is currently in the action stage of change. As such, her goal is to continue with weight loss efforts. She has agreed to the Category 1 Plan.   Push water to get 70 oz per day consistently. When eating  off plan, focus on increased protein + 10:1 ratio items, if possible.  Exercise goals: For substantial health benefits, adults should do at least 150 minutes (2 hours and 30 minutes) a week of moderate-intensity, or 75 minutes (1 hour and 15 minutes) a week of vigorous-intensity aerobic physical activity, or an equivalent combination of moderate- and vigorous-intensity aerobic activity. Aerobic activity should be performed in episodes of at least 10 minutes, and preferably, it should be spread throughout the week. Increase as tolerated.  Behavioral modification strategies: increasing lean protein intake and decreasing  simple carbohydrates.  Mary Odonnell has agreed to follow-up with our clinic in 3 weeks. She was informed of the importance of frequent follow-up visits to maximize her success with intensive lifestyle modifications for her multiple health conditions.   Objective:   Blood pressure 138/90, pulse 79, temperature 97.8 F (36.6 C), height '4\' 10"'$  (1.473 m), weight 215 lb (97.5 kg), last menstrual period 01/24/2016, SpO2 100 %. Body mass index is 44.94 kg/m.  General: Cooperative, alert, well developed, in no acute distress. HEENT: Conjunctivae and lids unremarkable. Cardiovascular: Regular rhythm.  Lungs: Normal work of breathing. Neurologic: No focal deficits.   Lab Results  Component Value Date   CREATININE 0.73 06/12/2020   BUN 12 06/12/2020   NA 139 06/12/2020   K 4.3 06/12/2020   CL 101 06/12/2020   CO2 24 06/12/2020   Lab Results  Component Value Date   ALT 19 06/12/2020   AST 14 06/12/2020   ALKPHOS 70 06/12/2020   BILITOT 0.6 06/12/2020   Lab Results  Component Value Date   HGBA1C 6.3 (H) 06/12/2020   HGBA1C 6.1 (H) 08/21/2018   HGBA1C 6.1 (H) 04/11/2018   HGBA1C 6.2 (H) 01/13/2018   HGBA1C 6.1 (H) 10/11/2017   Lab Results  Component Value Date   INSULIN 13.2 06/12/2020   Lab Results  Component Value Date   TSH 1.070 06/12/2020   Lab Results  Component Value Date   CHOL 170 06/12/2020   HDL 61 06/12/2020   LDLCALC 93 06/12/2020   TRIG 84 06/12/2020   CHOLHDL 2.8 06/12/2020   Lab Results  Component Value Date   VD25OH 25.4 (L) 06/12/2020   Lab Results  Component Value Date   WBC 4.7 06/12/2020   HGB 14.0 06/12/2020   HCT 43.7 06/12/2020   MCV 84 06/12/2020   PLT 365 06/12/2020   Attestation Statements:   Reviewed by clinician on day of visit: allergies, medications, problem list, medical history, surgical history, family history, social history, and previous encounter notes.  Coral Ceo, CMA, am acting as transcriptionist for [employer], DO.  I have reviewed the above documentation for accuracy and completeness, and I agree with the above. Marjory Sneddon, D.O.  The [place] was signed into law in 2016 which includes the topic of electronic health records.  This provides immediate access to information in MyChart.  This includes consultation notes, operative notes, office notes, lab results and pathology reports.  If you have any questions about what you read please let us know at your next visit so we can discuss your concerns and take corrective action if need be.  We are right here with you.

## 2020-09-09 ENCOUNTER — Encounter (INDEPENDENT_AMBULATORY_CARE_PROVIDER_SITE_OTHER): Payer: Self-pay | Admitting: Family Medicine

## 2020-09-09 ENCOUNTER — Ambulatory Visit (INDEPENDENT_AMBULATORY_CARE_PROVIDER_SITE_OTHER): Payer: Managed Care, Other (non HMO) | Admitting: Family Medicine

## 2020-09-09 ENCOUNTER — Other Ambulatory Visit: Payer: Self-pay

## 2020-09-09 VITALS — BP 138/91 | HR 79 | Temp 97.8°F | Ht <= 58 in | Wt 214.0 lb

## 2020-09-09 DIAGNOSIS — Z6841 Body Mass Index (BMI) 40.0 and over, adult: Secondary | ICD-10-CM

## 2020-09-09 DIAGNOSIS — R7303 Prediabetes: Secondary | ICD-10-CM

## 2020-09-09 DIAGNOSIS — E66813 Obesity, class 3: Secondary | ICD-10-CM

## 2020-09-09 DIAGNOSIS — I10 Essential (primary) hypertension: Secondary | ICD-10-CM | POA: Diagnosis not present

## 2020-09-09 DIAGNOSIS — Z9189 Other specified personal risk factors, not elsewhere classified: Secondary | ICD-10-CM

## 2020-09-09 DIAGNOSIS — E559 Vitamin D deficiency, unspecified: Secondary | ICD-10-CM | POA: Diagnosis not present

## 2020-09-09 MED ORDER — VITAMIN D (ERGOCALCIFEROL) 1.25 MG (50000 UNIT) PO CAPS: 50000.0000 [IU] | ORAL_CAPSULE | ORAL | 0 refills | Status: DC

## 2020-09-10 NOTE — Progress Notes (Signed)
Chief Complaint:   OBESITY Mary Odonnell is here to discuss her progress with her obesity treatment plan along with follow-up of her obesity related diagnoses. Mary Odonnell is on the Category 1 Plan and states she is following her eating plan approximately 70% of the time. Mary Odonnell states she is walking 15 minutes 2 times per week.  Today's visit was #: 6 Starting weight: 225 lbs Starting date: 06/12/2020 Today's weight: 214 lbs Today's date: 09/09/2020 Total lbs lost to date: 11 Total lbs lost since last in-office visit: 1  Interim History: Mary Odonnell is skipping breakfast on weekends and doesn't eat it later on in the day. She denies issues or concerns with meal plan today. He hasn't made dishes we discussed last OV.  Assessment/Plan:  No orders of the defined types were placed in this encounter.   Medications Discontinued During This Encounter  Medication Reason   Vitamin D, Ergocalciferol, (DRISDOL) 1.25 MG (50000 UNIT) CAPS capsule Reorder     Meds ordered this encounter  Medications   Vitamin D, Ergocalciferol, (DRISDOL) 1.25 MG (50000 UNIT) CAPS capsule    Sig: Take 1 capsule (50,000 Units total) by mouth every 7 (seven) days.    Dispense:  4 capsule    Refill:  0    30 d supply; OV for RF     1. Essential hypertension [name] is tolerating medication(s) well without side effects.  Medication compliance is good and patient appears to be taking it as prescribed.  Denies additional concerns regarding this condition.  Not at goal. BP elevated today. Medications: Norvasc, losartan, and Lasix.    Plan: BP goal is 130/80 or less. Focus on prudent nutritional plan with low salt. Continue checking BP at home/work and follow up with PCP, if not at goal.   BP Readings from Last 3 Encounters:  09/09/20 (!) 138/91  08/27/20 138/90  08/11/20 135/84   Lab Results  Component Value Date   CREATININE 0.73 06/12/2020   2. Pre-diabetes Not at goal. Goal is HgbA1c < 5.7.  Medication:  Metformin.    Plan: Continue Metformin. Pt denies need for refill. She will continue to focus on protein-rich, low simple carbohydrate foods. We reviewed the importance of hydration, regular exercise for stress reduction, and restorative sleep.   Lab Results  Component Value Date   HGBA1C 6.3 (H) 06/12/2020   Lab Results  Component Value Date   INSULIN 13.2 06/12/2020   3. Vitamin D deficiency Not at goal.   Plan: Continue to take prescription Vitamin D '@50'$ ,000 IU every week as prescribed.  Follow-up for routine testing of Vitamin D, at least 2-3 times per year to avoid over-replacement.  Lab Results  Component Value Date   VD25OH 25.4 (L) 06/12/2020   Refill- Vitamin D, Ergocalciferol, (DRISDOL) 1.25 MG (50000 UNIT) CAPS capsule; Take 1 capsule (50,000 Units total) by mouth every 7 (seven) days.  Dispense: 4 capsule; Refill: 0  4. At risk for deficient intake of food Mary Odonnell is at risk for deficient intake of food due to skipping foods on plan. Mary Odonnell was given extensive education and counseling today of more than 9 minutes on risks associated with deficient food intake.  Counseled her on the importance of following our prescribed meal plan and eating adequate amounts of protein.  Discussed with Mary Odonnell that inadequate food intake over longer periods of time can slow their metabolism down significantly.   5. Obesity with current BMI of 44.8  Mary Odonnell is currently in the  action stage of change. As such, her goal is to continue with weight loss efforts. She has agreed to the Category 1 Plan.   Exercise goals:  As is- increase as tolerated.  Behavioral modification strategies: meal planning and cooking strategies and planning for success.  [name] has agreed to follow-up with our clinic in 2-3 weeks. She was informed of the importance of frequent follow-up visits to maximize her success with intensive lifestyle modifications for her multiple health conditions.   Objective:    Blood pressure (!) 138/91, pulse 79, temperature 97.8 F (36.6 C), height '4\' 10"'$  (1.473 m), weight 214 lb (97.1 kg), last menstrual period 01/24/2016, SpO2 97 %. Body mass index is 44.73 kg/m.  General: Cooperative, alert, well developed, in no acute distress. HEENT: Conjunctivae and lids unremarkable. Cardiovascular: Regular rhythm.  Lungs: Normal work of breathing. Neurologic: No focal deficits.   Lab Results  Component Value Date   CREATININE 0.73 06/12/2020   BUN 12 06/12/2020   NA 139 06/12/2020   K 4.3 06/12/2020   CL 101 06/12/2020   CO2 24 06/12/2020   Lab Results  Component Value Date   ALT 19 06/12/2020   AST 14 06/12/2020   ALKPHOS 70 06/12/2020   BILITOT 0.6 06/12/2020   Lab Results  Component Value Date   HGBA1C 6.3 (H) 06/12/2020   HGBA1C 6.1 (H) 08/21/2018   HGBA1C 6.1 (H) 04/11/2018   HGBA1C 6.2 (H) 01/13/2018   HGBA1C 6.1 (H) 10/11/2017   Lab Results  Component Value Date   INSULIN 13.2 06/12/2020   Lab Results  Component Value Date   TSH 1.070 06/12/2020   Lab Results  Component Value Date   CHOL 170 06/12/2020   HDL 61 06/12/2020   LDLCALC 93 06/12/2020   TRIG 84 06/12/2020   CHOLHDL 2.8 06/12/2020   Lab Results  Component Value Date   VD25OH 25.4 (L) 06/12/2020   Lab Results  Component Value Date   WBC 4.7 06/12/2020   HGB 14.0 06/12/2020   HCT 43.7 06/12/2020   MCV 84 06/12/2020   PLT 365 06/12/2020    Attestation Statements:   Reviewed by clinician on day of visit: allergies, medications, problem list, medical history, surgical history, family history, social history, and previous encounter notes.  Coral Ceo, CMA, am acting as transcriptionist for [employer], DO.  I have reviewed the above documentation for accuracy and completeness, and I agree with the above. Marjory Sneddon, D.O.  The [place] was signed into law in 2016 which includes the topic of electronic health records.  This  provides immediate access to information in MyChart.  This includes consultation notes, operative notes, office notes, lab results and pathology reports.  If you have any questions about what you read please let us know at your next visit so we can discuss your concerns and take corrective action if need be.  We are right here with you.

## 2020-09-26 ENCOUNTER — Other Ambulatory Visit (INDEPENDENT_AMBULATORY_CARE_PROVIDER_SITE_OTHER): Payer: Self-pay | Admitting: Family Medicine

## 2020-09-26 DIAGNOSIS — R7303 Prediabetes: Secondary | ICD-10-CM

## 2020-09-29 LAB — CBC AND DIFFERENTIAL
HCT: 42 (ref 36–46)
Hemoglobin: 13.5 (ref 12.0–16.0)
Platelets: 279 (ref 150–399)
WBC: 4.5

## 2020-09-29 LAB — HEPATIC FUNCTION PANEL
ALT: 15 (ref 7–35)
AST: 12 — AB (ref 13–35)
Bilirubin, Total: 0.5

## 2020-09-29 LAB — LIPID PANEL
Cholesterol: 161 (ref 0–200)
HDL: 60 (ref 35–70)
LDL Cholesterol: 88
Triglycerides: 64 (ref 40–160)

## 2020-09-29 LAB — CBC: RBC: 4.95 (ref 3.87–5.11)

## 2020-09-29 LAB — COMPREHENSIVE METABOLIC PANEL
Albumin: 4.1 (ref 3.5–5.0)
Calcium: 9.1 (ref 8.7–10.7)

## 2020-09-29 LAB — HEMOGLOBIN A1C: Hemoglobin A1C: 6

## 2020-09-29 LAB — TSH: TSH: 1.1 (ref 0.41–5.90)

## 2020-09-29 NOTE — Telephone Encounter (Signed)
Last OV with Dr Opalski 

## 2020-09-30 ENCOUNTER — Encounter (INDEPENDENT_AMBULATORY_CARE_PROVIDER_SITE_OTHER): Payer: Self-pay | Admitting: Family Medicine

## 2020-09-30 ENCOUNTER — Encounter (INDEPENDENT_AMBULATORY_CARE_PROVIDER_SITE_OTHER): Payer: Self-pay

## 2020-09-30 ENCOUNTER — Ambulatory Visit (INDEPENDENT_AMBULATORY_CARE_PROVIDER_SITE_OTHER): Payer: Managed Care, Other (non HMO) | Admitting: Family Medicine

## 2020-09-30 ENCOUNTER — Other Ambulatory Visit: Payer: Self-pay

## 2020-09-30 VITALS — BP 132/87 | HR 69 | Temp 98.6°F | Ht <= 58 in | Wt 212.0 lb

## 2020-09-30 DIAGNOSIS — E559 Vitamin D deficiency, unspecified: Secondary | ICD-10-CM

## 2020-09-30 DIAGNOSIS — Z9189 Other specified personal risk factors, not elsewhere classified: Secondary | ICD-10-CM | POA: Diagnosis not present

## 2020-09-30 DIAGNOSIS — I10 Essential (primary) hypertension: Secondary | ICD-10-CM

## 2020-09-30 DIAGNOSIS — Z6841 Body Mass Index (BMI) 40.0 and over, adult: Secondary | ICD-10-CM

## 2020-09-30 DIAGNOSIS — R7303 Prediabetes: Secondary | ICD-10-CM | POA: Diagnosis not present

## 2020-09-30 MED ORDER — VITAMIN D (ERGOCALCIFEROL) 1.25 MG (50000 UNIT) PO CAPS: 50000.0000 [IU] | ORAL_CAPSULE | ORAL | 0 refills | Status: DC

## 2020-10-01 NOTE — Progress Notes (Signed)
Chief Complaint:   OBESITY Mary Odonnell is here to discuss her progress with her obesity treatment plan along with follow-up of her obesity related diagnoses. Mary Odonnell is on the Category 1 Plan and states she is following her eating plan approximately 50% of the time. Mary Odonnell states she is walking 20-30 minutes 3 times per week.  Today's visit was #: 7 Starting weight: 225 lbs Starting date: 06/12/2020 Today's weight: 212 lbs Today's date: 09/30/2020 Total lbs lost to date: 13 Total lbs lost since last in-office visit: 2  Interim History: Mary Odonnell had an appt with her PCP yesterday and also brought in labs from her bloodwork done through [facility]. 09/29/2020 A1c 6.0; CBC- within normal limits; CMP- within normal limits; FLP- HDL 60, LDL 88, triglycerides 64 = all within normal limits; TSH normal. Pt started walking since her last OV. She is making a lot of healthy changes in her life along with her husband's support.  Subjective:   1. Pre-diabetes Mary Odonnell has a diagnosis of prediabetes based on her elevated HgA1c and was informed this puts her at greater risk of developing diabetes. She continues to work on diet and exercise to decrease her risk of diabetes. She denies nausea or hypoglycemia. Medication: Metformin  Lab Results  Component Value Date   HGBA1C 6.0 09/29/2020   Lab Results  Component Value Date   INSULIN 13.2 06/12/2020   2. Vitamin D deficiency She is currently taking prescription vitamin D 50,000 IU each week. She denies nausea, vomiting or muscle weakness.  Lab Results  Component Value Date   VD25OH 25.4 (L) 06/12/2020   3. Essential hypertension Pt's PCP is currently monitoring her home BP's- pt will start in the near future.  BP Readings from Last 3 Encounters:  09/30/20 132/87  09/09/20 (!) 138/91  08/27/20 138/90   Lab Results  Component Value Date   CREATININE 0.73 06/12/2020   CREATININE 0.73 08/21/2018   CREATININE 0.76 04/11/2018   4. At  risk for dehydration Mary Odonnell is at risk for dehydration due to inadequate intake.  Assessment/Plan:   Orders Placed This Encounter  Procedures   VITAMIN D 25 Hydroxy (Vit-D Deficiency, Fractures)    Medications Discontinued During This Encounter  Medication Reason   Vitamin D, Ergocalciferol, (DRISDOL) 1.25 MG (50000 UNIT) CAPS capsule Reorder     Meds ordered this encounter  Medications   Vitamin D, Ergocalciferol, (DRISDOL) 1.25 MG (50000 UNIT) CAPS capsule    Sig: Take 1 capsule (50,000 Units total) by mouth every 7 (seven) days.    Dispense:  4 capsule    Refill:  0    30 d supply; OV for RF     1. Pre-diabetes Mary Odonnell will continue to work on weight loss, exercise, and decreasing simple carbohydrates to help decrease the risk of diabetes. Continue Metformin as directed.  2. Vitamin D deficiency Low Vitamin D level contributes to fatigue and are associated with obesity, breast, and colon cancer. She agrees to continue to take prescription Vitamin D 50,000 IU every week and will follow-up for routine testing of Vitamin D, at least 2-3 times per year to avoid over-replacement. Recheck level at next OV to ensure range is 50-70.  Refill- Vitamin D, Ergocalciferol, (DRISDOL) 1.25 MG (50000 UNIT) CAPS capsule; Take 1 capsule (50,000 Units total) by mouth every 7 (seven) days.  Dispense: 4 capsule; Refill: 0  - VITAMIN D 25 Hydroxy (Vit-D Deficiency, Fractures)  3. Essential hypertension Mary Odonnell is working on healthy weight loss  and exercise to improve blood pressure control. We will watch for signs of hypotension as she continues her lifestyle modifications. Bring in log of home BP readings to next OV and send copy to PCP, per his request.  4. At risk for dehydration Mary Odonnell was given approximately 9 minutes dehydration prevention counseling today. Mary Odonnell is at risk for dehydration due to weight loss and current medication(s). She was encouraged to hydrate and monitor fluid status to  avoid dehydration as well as weight loss plateaus.   5. Obesity with current BMI of 44.4  Mary Odonnell is currently in the action stage of change. As such, her goal is to continue with weight loss efforts. She has agreed to the Category 1 Plan.   Exercise goals:  As is but increase to 5 days a week as tolerated.  Behavioral modification strategies: increasing lean protein intake, no skipping meals, and meal planning and cooking strategies.  Mary Odonnell has agreed to follow-up with our clinic in 2-3 weeks. She was informed of the importance of frequent follow-up visits to maximize her success with intensive lifestyle modifications for her multiple health conditions.   Objective:   Blood pressure 132/87, pulse 69, temperature 98.6 F (37 C), height 4\' 10"  (1.473 m), weight 212 lb (96.2 kg), last menstrual period 01/24/2016, SpO2 99 %. Body mass index is 44.31 kg/m.  General: Cooperative, alert, well developed, in no acute distress. HEENT: Conjunctivae and lids unremarkable. Cardiovascular: Regular rhythm.  Lungs: Normal work of breathing. Neurologic: No focal deficits.   Lab Results  Component Value Date   CREATININE 0.73 06/12/2020   BUN 12 06/12/2020   NA 139 06/12/2020   K 4.3 06/12/2020   CL 101 06/12/2020   CO2 24 06/12/2020   Lab Results  Component Value Date   ALT 15 09/29/2020   AST 12 (A) 09/29/2020   ALKPHOS 70 06/12/2020   BILITOT 0.6 06/12/2020   Lab Results  Component Value Date   HGBA1C 6.0 09/29/2020   HGBA1C 6.3 (H) 06/12/2020   HGBA1C 6.1 (H) 08/21/2018   HGBA1C 6.1 (H) 04/11/2018   HGBA1C 6.2 (H) 01/13/2018   Lab Results  Component Value Date   INSULIN 13.2 06/12/2020   Lab Results  Component Value Date   TSH 1.10 09/29/2020   Lab Results  Component Value Date   CHOL 161 09/29/2020   HDL 60 09/29/2020   LDLCALC 88 09/29/2020   TRIG 64 09/29/2020   CHOLHDL 2.8 06/12/2020   Lab Results  Component Value Date   VD25OH 25.4 (L) 06/12/2020   Lab  Results  Component Value Date   WBC 4.5 09/29/2020   HGB 13.5 09/29/2020   HCT 42 09/29/2020   MCV 84 06/12/2020   PLT 279 09/29/2020    Attestation Statements:   Reviewed by clinician on day of visit: allergies, medications, problem list, medical history, surgical history, family history, social history, and previous encounter notes.  Coral Ceo, CMA, am acting as transcriptionist for [employer], DO.  I have reviewed the above documentation for accuracy and completeness, and I agree with the above. Marjory Sneddon, D.O.  The [place] was signed into law in 2016 which includes the topic of electronic health records.  This provides immediate access to information in MyChart.  This includes consultation notes, operative notes, office notes, lab results and pathology reports.  If you have any questions about what you read please let us know at your next visit so we can discuss  your concerns and take corrective action if need be.  We are right here with you.

## 2020-10-16 ENCOUNTER — Ambulatory Visit (INDEPENDENT_AMBULATORY_CARE_PROVIDER_SITE_OTHER): Payer: Managed Care, Other (non HMO) | Admitting: Family Medicine

## 2020-10-16 ENCOUNTER — Other Ambulatory Visit: Payer: Self-pay

## 2020-10-16 ENCOUNTER — Encounter (INDEPENDENT_AMBULATORY_CARE_PROVIDER_SITE_OTHER): Payer: Self-pay | Admitting: Family Medicine

## 2020-10-16 VITALS — BP 136/83 | HR 83 | Temp 97.4°F | Ht <= 58 in | Wt 212.0 lb

## 2020-10-16 DIAGNOSIS — I10 Essential (primary) hypertension: Secondary | ICD-10-CM | POA: Diagnosis not present

## 2020-10-16 DIAGNOSIS — E559 Vitamin D deficiency, unspecified: Secondary | ICD-10-CM | POA: Diagnosis not present

## 2020-10-16 DIAGNOSIS — R7303 Prediabetes: Secondary | ICD-10-CM | POA: Diagnosis not present

## 2020-10-16 DIAGNOSIS — Z9189 Other specified personal risk factors, not elsewhere classified: Secondary | ICD-10-CM | POA: Diagnosis not present

## 2020-10-16 DIAGNOSIS — F430 Acute stress reaction: Secondary | ICD-10-CM

## 2020-10-16 DIAGNOSIS — Z6841 Body Mass Index (BMI) 40.0 and over, adult: Secondary | ICD-10-CM

## 2020-10-16 MED ORDER — METFORMIN HCL ER 750 MG PO TB24: ORAL_TABLET | ORAL | 0 refills | Status: DC

## 2020-10-16 MED ORDER — VITAMIN D (ERGOCALCIFEROL) 1.25 MG (50000 UNIT) PO CAPS: 50000.0000 [IU] | ORAL_CAPSULE | ORAL | 0 refills | Status: DC

## 2020-10-16 MED ORDER — BUPROPION HCL ER (SR) 100 MG PO TB12: 100.0000 mg | ORAL_TABLET | Freq: Every morning | ORAL | 0 refills | Status: DC

## 2020-10-16 NOTE — Patient Instructions (Signed)
The 10-year ASCVD risk score (Arnett DK, et al., 2019) is: 4.3%*   Values used to calculate the score:     Age: 55 years     Sex: Female     Is Non-Hispanic African American: Yes     Diabetic: No     Tobacco smoker: No     Systolic Blood Pressure: 357 mmHg     Is BP treated: Yes     HDL Cholesterol: 60 mg/dL*     Total Cholesterol: 161 mg/dL*     * - Cholesterol units were assumed for this score calculation

## 2020-10-16 NOTE — Progress Notes (Signed)
Chief Complaint:   OBESITY Mary Odonnell is here to discuss her progress with her obesity treatment plan along with follow-up of her obesity related diagnoses. Mary Odonnell is on the Category 1 Plan and states she is following her eating plan approximately 40% (increased stress) of the time. Mary Odonnell states she is walking on the treadmill for 30 minutes 3 times per week.  Today's visit was #: 8 Starting weight: 225 lbs Starting date: 06/12/2020 Today's weight: 212 lbs Today's date: 10/16/2020 Total lbs lost to date: 13 lbs Total lbs lost since last in-office visit: 0  Interim History: Mary Odonnell notes increased stress at work. She is not drinking enough water. She only had 1 night of emotional eating.  Subjective:   1. Prediabetes Mary Odonnell's A1C was 6.0 from her primary care physician recently. She is currently on Metformin.  2. Vitamin D deficiency Mary Odonnell is currently taking prescription vitamin D 50,000 IU each week. She denies nausea, vomiting or muscle weakness.  3. Essential hypertension Mary Odonnell's last blood pressures are as follows:  132/87, 140/90, 162/102 and 134/84. She is currently on Norvasc, Losartan and Lasix.   4. Stress reaction Mary Odonnell has a lot of work stressors. She doing pretty well with dealing with emotional eating and working on techniques to help her cope.   5. At risk for depression Mary Odonnell is struggling with emotional eating and using food for comfort to the extent that it is negatively impacting her health. She has been working on behavior modification techniques to help reduce her emotional eating. She shows no sign of suicidal or homicidal ideations.   Assessment/Plan:  No orders of the defined types were placed in this encounter.   Medications Discontinued During This Encounter  Medication Reason   metFORMIN (GLUCOPHAGE-XR) 750 MG 24 hr tablet Reorder   Vitamin D, Ergocalciferol, (DRISDOL) 1.25 MG (50000 UNIT) CAPS capsule Reorder     Meds ordered this encounter   Medications   DISCONTD: Vitamin D, Ergocalciferol, (DRISDOL) 1.25 MG (50000 UNIT) CAPS capsule    Sig: Take 1 capsule (50,000 Units total) by mouth every 7 (seven) days.    Dispense:  4 capsule    Refill:  0    30 d supply; OV for RF   DISCONTD: metFORMIN (GLUCOPHAGE-XR) 750 MG 24 hr tablet    Sig: 1 po qd    Dispense:  30 tablet    Refill:  0    30 d supply;  ** OV for RF **   Do not send RF request   DISCONTD: buPROPion ER (WELLBUTRIN SR) 100 MG 12 hr tablet    Sig: Take 1 tablet (100 mg total) by mouth in the morning.    Dispense:  30 tablet    Refill:  0    30 d supply;  ** OV for RF **   Do not send RF request     1. Prediabetes Mary Odonnell will continue to work on weight loss, exercise, and decreasing simple carbohydrates to help decrease the risk of diabetes. We will refill Metformin for 1 month.  - metFORMIN (GLUCOPHAGE-XR) 750 MG 24 hr tablet; 1 po qd  Dispense: 30 tablet; Refill: 0  2. Vitamin D deficiency Low Vitamin D level contributes to fatigue and are associated with obesity, breast, and colon cancer. We will check Vitamin D today. We will refill prescription Vitamin D 50,000 IU every week for 1 month. Mary Odonnell will follow-up for routine testing of Vitamin D, at least 2-3 times per year to avoid over-replacement.  -  Vitamin D, Ergocalciferol, (DRISDOL) 1.25 MG (50000 UNIT) CAPS capsule; Take 1 capsule (50,000 Units total) by mouth every 7 (seven) days.  Dispense: 4 capsule; Refill: 0  3. Essential hypertension Mary Odonnell's blood pressure was elevated and high normal but it's okay for now. She will continue her medications and weight loss, and she will decrease salt and increase exercise to improve blood pressure control. We will watch for signs of hypotension as she continues her lifestyle modifications.  4. Stress reaction Mary Odonnell agreed to start Wellbutrin SR 100 mg q AM with no refills. Risks and benefits were discussed with the patient today. She will continue strategies of  self-care to help cope with stressors.   - buPROPion ER (WELLBUTRIN SR) 100 MG 12 hr tablet; Take 1 tablet (100 mg total) by mouth in the morning.  Dispense: 30 tablet; Refill: 0  5. At risk for depression Mary Odonnell was given approximately 23 minutes of depression risk counseling today. She has risk factors for depression including stress. We discussed the importance of a healthy work life balance, a healthy relationship with food and a good support system.  Repetitive spaced learning was employed today to elicit superior memory formation and behavioral change.  6. Obesity with current BMI of 44.5 Mary Odonnell is currently in the action stage of change. As such, her goal is to continue with weight loss efforts. She has agreed to the Category 1 Plan.   Mary Odonnell agreed to start Wellbutrin.  Exercise goals:  As is.  Behavioral modification strategies: better snacking choices, emotional eating strategies, and planning for success.  Mary Odonnell has agreed to follow-up with our clinic in 2-3 weeks. She was informed of the importance of frequent follow-up visits to maximize her success with intensive lifestyle modifications for her multiple health conditions.   Mary Odonnell was informed we would discuss her lab results at her next visit unless there is a critical issue that needs to be addressed sooner. Mary Odonnell agreed to keep her next visit at the agreed upon time to discuss these results.  Objective:   Blood pressure 136/83, pulse 83, temperature (!) 97.4 F (36.3 C), height 4\' 10"  (1.473 m), weight 212 lb (96.2 kg), last menstrual period 01/24/2016, SpO2 94 %. Body mass index is 44.31 kg/m.  General: Cooperative, alert, well developed, in no acute distress. HEENT: Conjunctivae and lids unremarkable. Cardiovascular: Regular rhythm.  Lungs: Normal work of breathing. Neurologic: No focal deficits.   Lab Results  Component Value Date   CREATININE 0.73 06/12/2020   BUN 12 06/12/2020   NA 139 06/12/2020   K  4.3 06/12/2020   CL 101 06/12/2020   CO2 24 06/12/2020   Lab Results  Component Value Date   ALT 15 09/29/2020   AST 12 (A) 09/29/2020   ALKPHOS 70 06/12/2020   BILITOT 0.6 06/12/2020   Lab Results  Component Value Date   HGBA1C 6.0 09/29/2020   HGBA1C 6.3 (H) 06/12/2020   HGBA1C 6.1 (H) 08/21/2018   HGBA1C 6.1 (H) 04/11/2018   HGBA1C 6.2 (H) 01/13/2018   Lab Results  Component Value Date   INSULIN 13.2 06/12/2020   Lab Results  Component Value Date   TSH 1.10 09/29/2020   Lab Results  Component Value Date   CHOL 161 09/29/2020   HDL 60 09/29/2020   LDLCALC 88 09/29/2020   TRIG 64 09/29/2020   CHOLHDL 2.8 06/12/2020   Lab Results  Component Value Date   VD25OH 25.4 (L) 06/12/2020   Lab Results  Component Value Date  WBC 4.5 09/29/2020   HGB 13.5 09/29/2020   HCT 42 09/29/2020   MCV 84 06/12/2020   PLT 279 09/29/2020   No results found for: IRON, TIBC, FERRITIN  Attestation Statements:   Reviewed by clinician on day of visit: allergies, medications, problem list, medical history, surgical history, family history, social history, and previous encounter notes.  I, Lizbeth Bark, RMA, am acting as Location manager for [employer], DO.   I have reviewed the above documentation for accuracy and completeness, and I agree with the above. Marjory Sneddon, D.O.  The [place] was signed into law in 2016 which includes the topic of electronic health records.  This provides immediate access to information in MyChart.  This includes consultation notes, operative notes, office notes, lab results and pathology reports.  If you have any questions about what you read please let us know at your next visit so we can discuss your concerns and take corrective action if need be.  We are right here with you.

## 2020-10-17 LAB — VITAMIN D 25 HYDROXY (VIT D DEFICIENCY, FRACTURES): Vit D, 25-Hydroxy: 47.8 ng/mL (ref 30.0–100.0)

## 2020-10-29 ENCOUNTER — Other Ambulatory Visit: Payer: Self-pay

## 2020-10-29 ENCOUNTER — Ambulatory Visit (INDEPENDENT_AMBULATORY_CARE_PROVIDER_SITE_OTHER): Payer: Managed Care, Other (non HMO) | Admitting: Family Medicine

## 2020-10-29 ENCOUNTER — Encounter (INDEPENDENT_AMBULATORY_CARE_PROVIDER_SITE_OTHER): Payer: Self-pay | Admitting: Family Medicine

## 2020-10-29 VITALS — BP 124/84 | HR 83 | Temp 97.8°F | Ht <= 58 in | Wt 210.0 lb

## 2020-10-29 DIAGNOSIS — I10 Essential (primary) hypertension: Secondary | ICD-10-CM

## 2020-10-29 DIAGNOSIS — F430 Acute stress reaction: Secondary | ICD-10-CM | POA: Diagnosis not present

## 2020-10-29 DIAGNOSIS — Z9189 Other specified personal risk factors, not elsewhere classified: Secondary | ICD-10-CM

## 2020-10-29 DIAGNOSIS — R7303 Prediabetes: Secondary | ICD-10-CM

## 2020-10-29 DIAGNOSIS — E559 Vitamin D deficiency, unspecified: Secondary | ICD-10-CM | POA: Diagnosis not present

## 2020-10-29 DIAGNOSIS — Z6841 Body Mass Index (BMI) 40.0 and over, adult: Secondary | ICD-10-CM

## 2020-10-29 MED ORDER — VITAMIN D (ERGOCALCIFEROL) 1.25 MG (50000 UNIT) PO CAPS
50000.0000 [IU] | ORAL_CAPSULE | ORAL | 0 refills | Status: DC
Start: 2020-10-29 — End: 2020-11-19

## 2020-10-29 MED ORDER — METFORMIN HCL ER 750 MG PO TB24: ORAL_TABLET | ORAL | 0 refills | Status: DC

## 2020-10-29 MED ORDER — BUPROPION HCL ER (SR) 100 MG PO TB12: 100.0000 mg | ORAL_TABLET | Freq: Every morning | ORAL | 0 refills | Status: DC

## 2020-10-29 NOTE — Progress Notes (Signed)
Chief Complaint:   OBESITY Mary Odonnell is here to discuss her progress with her obesity treatment plan along with follow-up of her obesity related diagnoses. Mary Odonnell is on the Category 1 Plan and states she is following her eating plan approximately 60% of the time. Mary Odonnell states she is walking 30 minutes 3 times per week.  Today's visit was #: 9 Starting weight: 225 lbs Starting date: 06/12/2020 Today's weight: 210 lbs Today's date: 10/29/2020 Total lbs lost to date: 15 Total lbs lost since last in-office visit: 2  Interim History: Mary Odonnell is here for a follow up office visit.  We reviewed her meal plan and questions were answered.  Patient's food recall appears to be accurate and consistent with what is on plan when she is following it.   When eating on plan, her hunger and cravings are well controlled.   Mary Odonnell occasionally skips foods due to increased stressors with work and lack of time.  Subjective:   1. Stress reaction Mary Odonnell was started on Wellbutrin at last OV and is tolerating it well. She is taking it once a day. Pt denies need for change in dose.  2. Essential hypertension BP controlled in office today.  BP Readings from Last 3 Encounters:  10/29/20 124/84  10/16/20 136/83  09/30/20 132/87   Lab Results  Component Value Date   CREATININE 0.73 06/12/2020   CREATININE 0.73 08/21/2018   CREATININE 0.76 04/11/2018   3. Pre-diabetes Mary Odonnell has a diagnosis of prediabetes based on her elevated HgA1c and was informed this puts her at greater risk of developing diabetes. She continues to work on diet and exercise to decrease her risk of diabetes. She denies nausea or hypoglycemia. Medication: Metformin  Lab Results  Component Value Date   HGBA1C 6.0 09/29/2020   Lab Results  Component Value Date   INSULIN 13.2 06/12/2020   4. Vitamin D deficiency She is currently taking prescription vitamin D 50,000 IU each week. She denies nausea, vomiting or muscle  weakness.  Lab Results  Component Value Date   VD25OH 47.8 10/16/2020   VD25OH 25.4 (L) 06/12/2020   5. At risk for deficient intake of food The patient is at a higher than average risk of deficient intake of food due to inadequate intake.  Assessment/Plan:  No orders of the defined types were placed in this encounter.   Medications Discontinued During This Encounter  Medication Reason   metFORMIN (GLUCOPHAGE-XR) 750 MG 24 hr tablet Reorder   buPROPion ER (WELLBUTRIN SR) 100 MG 12 hr tablet Reorder   Vitamin D, Ergocalciferol, (DRISDOL) 1.25 MG (50000 UNIT) CAPS capsule Reorder     Meds ordered this encounter  Medications   buPROPion ER (WELLBUTRIN SR) 100 MG 12 hr tablet    Sig: Take 1 tablet (100 mg total) by mouth in the morning.    Dispense:  30 tablet    Refill:  0    30 d supply;  ** OV for RF **   Do not send RF request   metFORMIN (GLUCOPHAGE-XR) 750 MG 24 hr tablet    Sig: 1 po qd    Dispense:  30 tablet    Refill:  0    30 d supply;  ** OV for RF **   Do not send RF request   Vitamin D, Ergocalciferol, (DRISDOL) 1.25 MG (50000 UNIT) CAPS capsule    Sig: Take 1 capsule (50,000 Units total) by mouth every 7 (seven) days.    Dispense:  4 capsule    Refill:  0    30 d supply; OV for RF     1. Stress reaction Pt will continue current dose of Wellbutrin. Continue to walk for stress relief.  Refill- buPROPion ER (WELLBUTRIN SR) 100 MG 12 hr tablet; Take 1 tablet (100 mg total) by mouth in the morning.  Dispense: 30 tablet; Refill: 0  2. Essential hypertension Mary Odonnell is working on healthy weight loss and exercise to improve blood pressure control. We will watch for signs of hypotension as she continues her lifestyle modifications.  3. Pre-diabetes Mary Odonnell will continue to work on weight loss, exercise, and decreasing simple carbohydrates to help decrease the risk of diabetes.   Refill- metFORMIN (GLUCOPHAGE-XR) 750 MG 24 hr tablet; 1 po qd  Dispense: 30 tablet;  Refill: 0  4. Vitamin D deficiency Low Vitamin D level contributes to fatigue and are associated with obesity, breast, and colon cancer. She agrees to continue to take prescription Vitamin D 50,000 IU every week and will follow-up for routine testing of Vitamin D, at least 2-3 times per year to avoid over-replacement.  Refill Vit D 50,000 IU weekly.  5. At risk for deficient intake of food Mary Odonnell was given approximately 9 minutes of deficit intake of food prevention counseling today. Mary Odonnell is at risk for eating too few calories based on current food recall. She was encouraged to focus on meeting caloric and protein goals according to her recommended meal plan.    6. Obesity with current BMI of 44  Mary Odonnell is currently in the action stage of change. As such, her goal is to continue with weight loss efforts. She has agreed to the Category 1 Plan.   Exercise goals: For substantial health benefits, adults should do at least 150 minutes (2 hours and 30 minutes) a week of moderate-intensity, or 75 minutes (1 hour and 15 minutes) a week of vigorous-intensity aerobic physical activity, or an equivalent combination of moderate- and vigorous-intensity aerobic activity. Aerobic activity should be performed in episodes of at least 10 minutes, and preferably, it should be spread throughout the week.  Behavioral modification strategies: increasing lean protein intake and decreasing simple carbohydrates.  Mary Odonnell has agreed to follow-up with our clinic in 3 weeks. She was informed of the importance of frequent follow-up visits to maximize her success with intensive lifestyle modifications for her multiple health conditions.   Objective:   Blood pressure 124/84, pulse 83, temperature 97.8 F (36.6 C), height 4\' 10"  (1.473 m), weight 210 lb (95.3 kg), last menstrual period 01/24/2016, SpO2 98 %. Body mass index is 43.89 kg/m.  General: Cooperative, alert, well developed, in no acute distress. HEENT:  Conjunctivae and lids unremarkable. Cardiovascular: Regular rhythm.  Lungs: Normal work of breathing. Neurologic: No focal deficits.   Lab Results  Component Value Date   CREATININE 0.73 06/12/2020   BUN 12 06/12/2020   NA 139 06/12/2020   K 4.3 06/12/2020   CL 101 06/12/2020   CO2 24 06/12/2020   Lab Results  Component Value Date   ALT 15 09/29/2020   AST 12 (A) 09/29/2020   ALKPHOS 70 06/12/2020   BILITOT 0.6 06/12/2020   Lab Results  Component Value Date   HGBA1C 6.0 09/29/2020   HGBA1C 6.3 (H) 06/12/2020   HGBA1C 6.1 (H) 08/21/2018   HGBA1C 6.1 (H) 04/11/2018   HGBA1C 6.2 (H) 01/13/2018   Lab Results  Component Value Date   INSULIN 13.2 06/12/2020   Lab Results  Component Value Date  TSH 1.10 09/29/2020   Lab Results  Component Value Date   CHOL 161 09/29/2020   HDL 60 09/29/2020   LDLCALC 88 09/29/2020   TRIG 64 09/29/2020   CHOLHDL 2.8 06/12/2020   Lab Results  Component Value Date   VD25OH 47.8 10/16/2020   VD25OH 25.4 (L) 06/12/2020   Lab Results  Component Value Date   WBC 4.5 09/29/2020   HGB 13.5 09/29/2020   HCT 42 09/29/2020   MCV 84 06/12/2020   PLT 279 09/29/2020    Attestation Statements:   Reviewed by clinician on day of visit: allergies, medications, problem list, medical history, surgical history, family history, social history, and previous encounter notes.  Coral Ceo, CMA, am acting as transcriptionist for [employer], DO.  I have reviewed the above documentation for accuracy and completeness, and I agree with the above. Marjory Sneddon, D.O.  The [place] was signed into law in 2016 which includes the topic of electronic health records.  This provides immediate access to information in MyChart.  This includes consultation notes, operative notes, office notes, lab results and pathology reports.  If you have any questions about what you read please let us know at your next visit so we can discuss  your concerns and take corrective action if need be.  We are right here with you.

## 2020-11-05 ENCOUNTER — Other Ambulatory Visit (INDEPENDENT_AMBULATORY_CARE_PROVIDER_SITE_OTHER): Payer: Self-pay | Admitting: Family Medicine

## 2020-11-05 DIAGNOSIS — E559 Vitamin D deficiency, unspecified: Secondary | ICD-10-CM

## 2020-11-07 ENCOUNTER — Other Ambulatory Visit (INDEPENDENT_AMBULATORY_CARE_PROVIDER_SITE_OTHER): Payer: Self-pay | Admitting: Family Medicine

## 2020-11-07 DIAGNOSIS — R7303 Prediabetes: Secondary | ICD-10-CM

## 2020-11-07 DIAGNOSIS — F430 Acute stress reaction: Secondary | ICD-10-CM

## 2020-11-10 NOTE — Telephone Encounter (Signed)
Last seen by Dr. Jenetta Downer

## 2020-11-10 NOTE — Telephone Encounter (Signed)
Dr.Opalski ?

## 2020-11-19 ENCOUNTER — Ambulatory Visit (INDEPENDENT_AMBULATORY_CARE_PROVIDER_SITE_OTHER): Payer: Managed Care, Other (non HMO) | Admitting: Family Medicine

## 2020-11-19 ENCOUNTER — Other Ambulatory Visit: Payer: Self-pay

## 2020-11-19 ENCOUNTER — Encounter (INDEPENDENT_AMBULATORY_CARE_PROVIDER_SITE_OTHER): Payer: Self-pay | Admitting: Family Medicine

## 2020-11-19 VITALS — BP 133/81 | HR 77 | Temp 98.2°F | Ht <= 58 in | Wt 211.0 lb

## 2020-11-19 DIAGNOSIS — E559 Vitamin D deficiency, unspecified: Secondary | ICD-10-CM | POA: Diagnosis not present

## 2020-11-19 DIAGNOSIS — R030 Elevated blood-pressure reading, without diagnosis of hypertension: Secondary | ICD-10-CM | POA: Diagnosis not present

## 2020-11-19 DIAGNOSIS — Z6841 Body Mass Index (BMI) 40.0 and over, adult: Secondary | ICD-10-CM | POA: Diagnosis not present

## 2020-11-19 DIAGNOSIS — Z9189 Other specified personal risk factors, not elsewhere classified: Secondary | ICD-10-CM | POA: Diagnosis not present

## 2020-11-19 MED ORDER — VITAMIN D (ERGOCALCIFEROL) 1.25 MG (50000 UNIT) PO CAPS: 50000.0000 [IU] | ORAL_CAPSULE | ORAL | 0 refills | Status: DC

## 2020-11-20 NOTE — Progress Notes (Signed)
Chief Complaint:   OBESITY Mary Odonnell is here to discuss her progress with her obesity treatment plan along with follow-up of her obesity related diagnoses. Mary Odonnell is on the Category 1 Plan and states she is following her eating plan approximately 45% of the time. Mary Odonnell states she is walking and a slow run 30 minutes 2 times per week.  Today's visit was #: 10 Starting weight: 225 lbs Starting date: 06/12/2020 Today's weight: 211 lbs Today's date: 11/19/2020 Total lbs lost to date: 14 Total lbs lost since last in-office visit: +1  Interim History: Mary Odonnell's mother-in-law was in the hospital all last week and pt didn't food/meal prep like prior weeks. Overall, pt did well, especially due to stressors. She still did her 1 mile walk and is trying to be consistent with 60 oz of water a day.  Subjective:   1. Elevated blood pressure reading BP at goal today. Mary Odonnell's BP at home is 140/90- she bough a new [employer] Series 3.  2. Vitamin D deficiency She is currently taking prescription vitamin D 50,000 IU each week. She denies nausea, vomiting or muscle weakness.  3. At risk for diabetes mellitus Mary Odonnell is at higher than average risk for developing diabetes due to obesity.   Assessment/Plan:  No orders of the defined types were placed in this encounter.   Medications Discontinued During This Encounter  Medication Reason   Vitamin D, Ergocalciferol, (DRISDOL) 1.25 MG (50000 UNIT) CAPS capsule Reorder     Meds ordered this encounter  Medications   Vitamin D, Ergocalciferol, (DRISDOL) 1.25 MG (50000 UNIT) CAPS capsule    Sig: Take 1 capsule (50,000 Units total) by mouth every 7 (seven) days.    Dispense:  4 capsule    Refill:  0    30 d supply; OV for RF     1. Elevated blood pressure reading Mary Odonnell is working on healthy weight loss and exercise to improve blood pressure control. We will watch for signs of hypotension as she continues her lifestyle modifications. Continue home  monitoring to goal of less than 130/80-85.  2. Vitamin D deficiency Low Vitamin D level contributes to fatigue and are associated with obesity, breast, and colon cancer. She agrees to continue to take prescription Vitamin D 50,000 IU every week and will follow-up for routine testing of Vitamin D, at least 2-3 times per year to avoid over-replacement.  Refill- Vitamin D, Ergocalciferol, (DRISDOL) 1.25 MG (50000 UNIT) CAPS capsule; Take 1 capsule (50,000 Units total) by mouth every 7 (seven) days.  Dispense: 4 capsule; Refill: 0  3. At risk for diabetes mellitus Mary Odonnell was given approximately 9 minutes of diabetes education and counseling today. We discussed intensive lifestyle modifications today with an emphasis on weight loss as well as increasing exercise and decreasing simple carbohydrates in her diet. We also reviewed medication options with an emphasis on risk versus benefit of those discussed.   Repetitive spaced learning was employed today to elicit superior memory formation and behavioral change.  4. Obesity with current BMI of 44.1  Mary Odonnell is currently in the action stage of change. As such, her goal is to continue with weight loss efforts. She has agreed to the Category 1 Plan.   Exercise goals:  Increase as tolerated, especially for stress management.  Behavioral modification strategies: avoiding temptations and planning for success.  Mary Odonnell has agreed to follow-up with our clinic in 2-3 weeks. She was informed of the importance of frequent follow-up visits to maximize her success with  intensive lifestyle modifications for her multiple health conditions.   Objective:   Blood pressure 133/81, pulse 77, temperature 98.2 F (36.8 C), height 4\' 10"  (1.473 m), weight 211 lb (95.7 kg), last menstrual period 01/24/2016, SpO2 97 %. Body mass index is 44.1 kg/m.  General: Cooperative, alert, well developed, in no acute distress. HEENT: Conjunctivae and lids  unremarkable. Cardiovascular: Regular rhythm.  Lungs: Normal work of breathing. Neurologic: No focal deficits.   Lab Results  Component Value Date   CREATININE 0.73 06/12/2020   BUN 12 06/12/2020   NA 139 06/12/2020   K 4.3 06/12/2020   CL 101 06/12/2020   CO2 24 06/12/2020   Lab Results  Component Value Date   ALT 15 09/29/2020   AST 12 (A) 09/29/2020   ALKPHOS 70 06/12/2020   BILITOT 0.6 06/12/2020   Lab Results  Component Value Date   HGBA1C 6.0 09/29/2020   HGBA1C 6.3 (H) 06/12/2020   HGBA1C 6.1 (H) 08/21/2018   HGBA1C 6.1 (H) 04/11/2018   HGBA1C 6.2 (H) 01/13/2018   Lab Results  Component Value Date   INSULIN 13.2 06/12/2020   Lab Results  Component Value Date   TSH 1.10 09/29/2020   Lab Results  Component Value Date   CHOL 161 09/29/2020   HDL 60 09/29/2020   LDLCALC 88 09/29/2020   TRIG 64 09/29/2020   CHOLHDL 2.8 06/12/2020   Lab Results  Component Value Date   VD25OH 47.8 10/16/2020   VD25OH 25.4 (L) 06/12/2020   Lab Results  Component Value Date   WBC 4.5 09/29/2020   HGB 13.5 09/29/2020   HCT 42 09/29/2020   MCV 84 06/12/2020   PLT 279 09/29/2020    Attestation Statements:   Reviewed by clinician on day of visit: allergies, medications, problem list, medical history, surgical history, family history, social history, and previous encounter notes.  Mary Odonnell, CMA, am acting as transcriptionist for [employer], DO.  I have reviewed the above documentation for accuracy and completeness, and I agree with the above. Mary Odonnell, D.O.  The [place] was signed into law in 2016 which includes the topic of electronic health records.  This provides immediate access to information in MyChart.  This includes consultation notes, operative notes, office notes, lab results and pathology reports.  If you have any questions about what you read please let us know at your next visit so we can discuss your concerns and take  corrective action if need be.  We are right here with you.

## 2020-12-07 ENCOUNTER — Other Ambulatory Visit (INDEPENDENT_AMBULATORY_CARE_PROVIDER_SITE_OTHER): Payer: Self-pay | Admitting: Family Medicine

## 2020-12-07 DIAGNOSIS — F430 Acute stress reaction: Secondary | ICD-10-CM

## 2020-12-09 ENCOUNTER — Encounter (INDEPENDENT_AMBULATORY_CARE_PROVIDER_SITE_OTHER): Payer: Self-pay | Admitting: Family Medicine

## 2020-12-09 ENCOUNTER — Other Ambulatory Visit: Payer: Self-pay

## 2020-12-09 ENCOUNTER — Ambulatory Visit (INDEPENDENT_AMBULATORY_CARE_PROVIDER_SITE_OTHER): Payer: Managed Care, Other (non HMO) | Admitting: Family Medicine

## 2020-12-09 VITALS — BP 137/90 | HR 79 | Temp 97.9°F | Ht <= 58 in | Wt 213.0 lb

## 2020-12-09 DIAGNOSIS — Z9189 Other specified personal risk factors, not elsewhere classified: Secondary | ICD-10-CM

## 2020-12-09 DIAGNOSIS — Z6841 Body Mass Index (BMI) 40.0 and over, adult: Secondary | ICD-10-CM

## 2020-12-09 DIAGNOSIS — R7303 Prediabetes: Secondary | ICD-10-CM

## 2020-12-09 DIAGNOSIS — F430 Acute stress reaction: Secondary | ICD-10-CM

## 2020-12-09 DIAGNOSIS — E559 Vitamin D deficiency, unspecified: Secondary | ICD-10-CM | POA: Diagnosis not present

## 2020-12-09 MED ORDER — VITAMIN D (ERGOCALCIFEROL) 1.25 MG (50000 UNIT) PO CAPS: 50000.0000 [IU] | ORAL_CAPSULE | ORAL | 0 refills | Status: DC

## 2020-12-09 MED ORDER — BUPROPION HCL ER (SR) 100 MG PO TB12: 100.0000 mg | ORAL_TABLET | Freq: Every morning | ORAL | 0 refills | Status: DC

## 2020-12-09 MED ORDER — METFORMIN HCL ER 750 MG PO TB24: ORAL_TABLET | ORAL | 0 refills | Status: DC

## 2020-12-10 NOTE — Progress Notes (Signed)
Chief Complaint:   OBESITY Mary Odonnell is here to discuss her progress with her obesity treatment plan along with follow-up of her obesity related diagnoses. Mary Odonnell is on the Category 1 Plan and states she is following her eating plan approximately 40% of the time. Mary Odonnell states she is walking 30 minutes 2 times per week.  Today's visit was #: 11 Starting weight: 225 lbs Starting date: 06/12/2020 Today's weight: 213 lbs Today's date: 12/09/2020 Total lbs lost to date: 12 Total lbs lost since last in-office visit: +2  Interim History: Mary Odonnell is happy with Thanksgiving meal and how much weight she gained. She did PC/[place] and it "worked well". Pt organized her pantry/cabinets.  Subjective:   1. Stress reaction Pt's mood/stress is well controlled.   2. Prediabetes Mary Odonnell has a diagnosis of prediabetes based on her elevated HgA1c and was informed this puts her at greater risk of developing diabetes. She continues to work on diet and exercise to decrease her risk of diabetes. She denies nausea or hypoglycemia.  3. Vitamin D deficiency She is currently taking prescription vitamin D 50,000 IU each week. She denies nausea, vomiting or muscle weakness.  4. At risk for anxiety Mary Odonnell is at risk for anxiety due to mother-in-law having upcoming surgery and her care falling on pt's shoulders.  Assessment/Plan:  No orders of the defined types were placed in this encounter.   Medications Discontinued During This Encounter  Medication Reason   buPROPion ER (WELLBUTRIN SR) 100 MG 12 hr tablet Reorder   metFORMIN (GLUCOPHAGE-XR) 750 MG 24 hr tablet Reorder   Vitamin D, Ergocalciferol, (DRISDOL) 1.25 MG (50000 UNIT) CAPS capsule Reorder     Meds ordered this encounter  Medications   buPROPion ER (WELLBUTRIN SR) 100 MG 12 hr tablet    Sig: Take 1 tablet (100 mg total) by mouth in the morning.    Dispense:  30 tablet    Refill:  0    30 d supply;  ** OV for RF **   Do not send RF request    metFORMIN (GLUCOPHAGE-XR) 750 MG 24 hr tablet    Sig: 1 po qd    Dispense:  30 tablet    Refill:  0    30 d supply;  ** OV for RF **   Do not send RF request   Vitamin D, Ergocalciferol, (DRISDOL) 1.25 MG (50000 UNIT) CAPS capsule    Sig: Take 1 capsule (50,000 Units total) by mouth every 7 (seven) days.    Dispense:  4 capsule    Refill:  0    30 d supply; OV for RF     1. Stress reaction Continue current treatment plan.  Refill- buPROPion ER (WELLBUTRIN SR) 100 MG 12 hr tablet; Take 1 tablet (100 mg total) by mouth in the morning.  Dispense: 30 tablet; Refill: 0  2. Prediabetes Mary Odonnell will continue to work on weight loss, exercise, and decreasing simple carbohydrates to help decrease the risk of diabetes.   Refill- metFORMIN (GLUCOPHAGE-XR) 750 MG 24 hr tablet; 1 po qd  Dispense: 30 tablet; Refill: 0  3. Vitamin D deficiency Low Vitamin D level contributes to fatigue and are associated with obesity, breast, and colon cancer. She agrees to continue to take prescription Vitamin D 50,000 IU every week and will follow-up for routine testing of Vitamin D, at least 2-3 times per year to avoid over-replacement.  Refill- Vitamin D, Ergocalciferol, (DRISDOL) 1.25 MG (50000 UNIT) CAPS capsule; Take 1 capsule (50,000  Units total) by mouth every 7 (seven) days.  Dispense: 4 capsule; Refill: 0  4. At risk for anxiety Mary Odonnell was given approximately 9 minutes of anxiety risk counseling today. She has risk factors for anxiety including having to take care of her mother-in-law after surgery. We discussed the importance of a healthy work life balance, a healthy relationship with food and a good support system.  Repetitive spaced learning was employed today to elicit superior memory formation and behavioral change.  5. Obesity with current BMI of 44.7  Mary Odonnell is currently in the action stage of change. As such, her goal is to continue with weight loss efforts. She has agreed to the Category 1 Plan.    Pt's goal is to stay de-stressed through the holidays. Her goal is also to meditate/find a meditation app that works for her to help with stress management.  Exercise goals:  Goal is to walk for 30 minutes QD.   Behavioral modification strategies: meal planning and cooking strategies and planning for success.  Mary Odonnell has agreed to follow-up with our clinic in 2 weeks. She was informed of the importance of frequent follow-up visits to maximize her success with intensive lifestyle modifications for her multiple health conditions.   Objective:   Blood pressure 137/90, pulse 79, temperature 97.9 F (36.6 C), height 4\' 10"  (1.473 m), weight 213 lb (96.6 kg), last menstrual period 01/24/2016, SpO2 99 %. Body mass index is 44.52 kg/m.  General: Cooperative, alert, well developed, in no acute distress. HEENT: Conjunctivae and lids unremarkable. Cardiovascular: Regular rhythm.  Lungs: Normal work of breathing. Neurologic: No focal deficits.   Lab Results  Component Value Date   CREATININE 0.73 06/12/2020   BUN 12 06/12/2020   NA 139 06/12/2020   K 4.3 06/12/2020   CL 101 06/12/2020   CO2 24 06/12/2020   Lab Results  Component Value Date   ALT 15 09/29/2020   AST 12 (A) 09/29/2020   ALKPHOS 70 06/12/2020   BILITOT 0.6 06/12/2020   Lab Results  Component Value Date   HGBA1C 6.0 09/29/2020   HGBA1C 6.3 (H) 06/12/2020   HGBA1C 6.1 (H) 08/21/2018   HGBA1C 6.1 (H) 04/11/2018   HGBA1C 6.2 (H) 01/13/2018   Lab Results  Component Value Date   INSULIN 13.2 06/12/2020   Lab Results  Component Value Date   TSH 1.10 09/29/2020   Lab Results  Component Value Date   CHOL 161 09/29/2020   HDL 60 09/29/2020   LDLCALC 88 09/29/2020   TRIG 64 09/29/2020   CHOLHDL 2.8 06/12/2020   Lab Results  Component Value Date   VD25OH 47.8 10/16/2020   VD25OH 25.4 (L) 06/12/2020   Lab Results  Component Value Date   WBC 4.5 09/29/2020   HGB 13.5 09/29/2020   HCT 42 09/29/2020   MCV  84 06/12/2020   PLT 279 09/29/2020    Attestation Statements:   Reviewed by clinician on day of visit: allergies, medications, problem list, medical history, surgical history, family history, social history, and previous encounter notes.  Coral Ceo, CMA, am acting as transcriptionist for [employer], DO.  I have reviewed the above documentation for accuracy and completeness, and I agree with the above. Marjory Sneddon, D.O.  The [place] was signed into law in 2016 which includes the topic of electronic health records.  This provides immediate access to information in MyChart.  This includes consultation notes, operative notes, office notes, lab results and pathology  reports.  If you have any questions about what you read please let us know at your next visit so we can discuss your concerns and take corrective action if need be.  We are right here with you.

## 2020-12-24 ENCOUNTER — Ambulatory Visit (INDEPENDENT_AMBULATORY_CARE_PROVIDER_SITE_OTHER): Payer: Managed Care, Other (non HMO) | Admitting: Family Medicine

## 2020-12-24 ENCOUNTER — Other Ambulatory Visit: Payer: Self-pay

## 2020-12-24 ENCOUNTER — Encounter (INDEPENDENT_AMBULATORY_CARE_PROVIDER_SITE_OTHER): Payer: Self-pay | Admitting: Family Medicine

## 2020-12-24 VITALS — BP 147/90 | HR 80 | Temp 97.7°F | Ht <= 58 in | Wt 213.0 lb

## 2020-12-24 DIAGNOSIS — E559 Vitamin D deficiency, unspecified: Secondary | ICD-10-CM

## 2020-12-24 DIAGNOSIS — F430 Acute stress reaction: Secondary | ICD-10-CM

## 2020-12-24 DIAGNOSIS — R7303 Prediabetes: Secondary | ICD-10-CM | POA: Diagnosis not present

## 2020-12-24 DIAGNOSIS — E669 Obesity, unspecified: Secondary | ICD-10-CM

## 2020-12-24 DIAGNOSIS — Z6841 Body Mass Index (BMI) 40.0 and over, adult: Secondary | ICD-10-CM

## 2020-12-24 DIAGNOSIS — Z9189 Other specified personal risk factors, not elsewhere classified: Secondary | ICD-10-CM

## 2020-12-24 MED ORDER — METFORMIN HCL ER 750 MG PO TB24
ORAL_TABLET | ORAL | 0 refills | Status: DC
Start: 2020-12-24 — End: 2021-01-14

## 2020-12-24 MED ORDER — VITAMIN D (ERGOCALCIFEROL) 1.25 MG (50000 UNIT) PO CAPS: 50000.0000 [IU] | ORAL_CAPSULE | ORAL | 0 refills | Status: DC

## 2020-12-24 MED ORDER — BUPROPION HCL ER (SR) 100 MG PO TB12: 100.0000 mg | ORAL_TABLET | Freq: Every morning | ORAL | 0 refills | Status: DC

## 2020-12-25 NOTE — Progress Notes (Signed)
Chief Complaint:   OBESITY Mary Odonnell is here to discuss her progress with her obesity treatment plan along with follow-up of her obesity related diagnoses. Maleiyah is on the Category 1 Plan and states she is following her eating plan approximately 25% of the time. Dessie states she is walking for 20 minutes 3 times per week.  Today's visit was #: 12 Starting weight: 225 lbs Starting date: 06/12/2020 Today's weight: 213 lbs Today's date: 12/24/2020 Total lbs lost to date: 12 Total lbs lost since last in-office visit: 0  Interim History: Ramiah slipped off the plan while she was away with her family at a funeral. She tried to eat healthier yesterday, and her food was recall yesterday was perfectly on the plan.  Subjective:   1. Pre-diabetes Maryna notes "change" when she takes her metformin in the morning instead of at night. She is intolerant to 12 hour metformin.  2. Vitamin D deficiency Phoua Hoadley Cantrall is tolerating medication(s) well without side effects. Medication compliance is good and patient appears to be taking it as prescribed.  Denies additional concerns regarding this condition.   3. Stress reaction with emotional eating Andersyn is taking bupropion regularly now. She is tolerating medication(s) well without side effects. Medication compliance is good and patient appears to be taking it as prescribed. Denies additional concerns regarding this condition. She still notes a lot of stress at work and anxiety associated with "being perfect at work". Her mother in-law has surgery tomorrow.  4. At risk for activity intolerance Reham is at risk for exercise intolerance due to lack of activity and stress.  Assessment/Plan:  No orders of the defined types were placed in this encounter.   Medications Discontinued During This Encounter  Medication Reason   buPROPion ER (WELLBUTRIN SR) 100 MG 12 hr tablet Reorder   metFORMIN (GLUCOPHAGE-XR) 750 MG 24 hr tablet Reorder   Vitamin D,  Ergocalciferol, (DRISDOL) 1.25 MG (50000 UNIT) CAPS capsule Reorder     Meds ordered this encounter  Medications   buPROPion ER (WELLBUTRIN SR) 100 MG 12 hr tablet    Sig: Take 1 tablet (100 mg total) by mouth in the morning.    Dispense:  30 tablet    Refill:  0    30 d supply;  ** OV for RF **   Do not send RF request   metFORMIN (GLUCOPHAGE-XR) 750 MG 24 hr tablet    Sig: 1 po qd    Dispense:  30 tablet    Refill:  0    30 d supply;  ** OV for RF **   Do not send RF request   Vitamin D, Ergocalciferol, (DRISDOL) 1.25 MG (50000 UNIT) CAPS capsule    Sig: Take 1 capsule (50,000 Units total) by mouth every 7 (seven) days.    Dispense:  4 capsule    Refill:  0    30 d supply; OV for RF     1. Pre-diabetes We will refill metformin for 1 month. Rai is to change the timing of when she takes her medication. She will continue to work on weight loss, exercise, and decreasing simple carbohydrates to help decrease the risk of diabetes.   - metFORMIN (GLUCOPHAGE-XR) 750 MG 24 hr tablet; 1 po qd  Dispense: 30 tablet; Refill: 0  2. Vitamin D deficiency Rebbie will continue prescription Vitamin D 50,000 IU every week, and we will refill for 1 month. She will follow-up for routine testing of Vitamin D, at least  2-3 times per year to avoid over-replacement.  - Vitamin D, Ergocalciferol, (DRISDOL) 1.25 MG (50000 UNIT) CAPS capsule; Take 1 capsule (50,000 Units total) by mouth every 7 (seven) days.  Dispense: 4 capsule; Refill: 0  3. Stress reaction with emotional eating Floraine declines counseling referral. I advised her to start prayer or meditation. Mindful eating strategies were discussed with the patient and handouts were given. We will refill Wellbutrin SR for 1 month. Orders and follow up as documented in patient record.   - buPROPion ER (WELLBUTRIN SR) 100 MG 12 hr tablet; Take 1 tablet (100 mg total) by mouth in the morning.  Dispense: 30 tablet; Refill: 0  4. At risk for activity  intolerance Rebeckah was given approximately 9 minutes of exercise intolerance counseling today. She is 55 y.o. female and has risk factors exercise intolerance including obesity. We discussed intensive lifestyle modifications today with an emphasis on specific weight loss instructions and strategies. Cymone will slowly increase activity as tolerated.  Repetitive spaced learning was employed today to elicit superior memory formation and behavioral change.  5. Obesity with current BMI of 44.6 Anatasia is currently in the action stage of change. As such, her goal is to continue with weight loss efforts. She has agreed to the Category 1 Plan.   Deone is to walk for 10 minutes 5 days per week for stress management.  Exercise goals: All adults should avoid inactivity. Some physical activity is better than none, and adults who participate in any amount of physical activity gain some health benefits.  Behavioral modification strategies: increasing lean protein intake, decreasing simple carbohydrates, and planning for success.  Karrisa has agreed to follow-up with our clinic in 2 to 3 weeks. She was informed of the importance of frequent follow-up visits to maximize her success with intensive lifestyle modifications for her multiple health conditions.   Objective:   Blood pressure (!) 147/90, pulse 80, temperature 97.7 F (36.5 C), height 4\' 10"  (1.473 m), weight 213 lb (96.6 kg), last menstrual period 01/24/2016, SpO2 99 %. Body mass index is 44.52 kg/m.  General: Cooperative, alert, well developed, in no acute distress. HEENT: Conjunctivae and lids unremarkable. Cardiovascular: Regular rhythm.  Lungs: Normal work of breathing. Neurologic: No focal deficits.   Lab Results  Component Value Date   CREATININE 0.73 06/12/2020   BUN 12 06/12/2020   NA 139 06/12/2020   K 4.3 06/12/2020   CL 101 06/12/2020   CO2 24 06/12/2020   Lab Results  Component Value Date   ALT 15 09/29/2020   AST 12 (A)  09/29/2020   ALKPHOS 70 06/12/2020   BILITOT 0.6 06/12/2020   Lab Results  Component Value Date   HGBA1C 6.0 09/29/2020   HGBA1C 6.3 (H) 06/12/2020   HGBA1C 6.1 (H) 08/21/2018   HGBA1C 6.1 (H) 04/11/2018   HGBA1C 6.2 (H) 01/13/2018   Lab Results  Component Value Date   INSULIN 13.2 06/12/2020   Lab Results  Component Value Date   TSH 1.10 09/29/2020   Lab Results  Component Value Date   CHOL 161 09/29/2020   HDL 60 09/29/2020   LDLCALC 88 09/29/2020   TRIG 64 09/29/2020   CHOLHDL 2.8 06/12/2020   Lab Results  Component Value Date   VD25OH 47.8 10/16/2020   VD25OH 25.4 (L) 06/12/2020   Lab Results  Component Value Date   WBC 4.5 09/29/2020   HGB 13.5 09/29/2020   HCT 42 09/29/2020   MCV 84 06/12/2020   PLT 279 09/29/2020  No results found for: IRON, TIBC, FERRITIN  Attestation Statements:   Reviewed by clinician on day of visit: allergies, medications, problem list, medical history, surgical history, family history, social history, and previous encounter notes.   Wilhemena Durie, am acting as transcriptionist for [employer], DO.  I have reviewed the above documentation for accuracy and completeness, and I agree with the above. Marjory Sneddon, D.O.  The [place] was signed into law in 2016 which includes the topic of electronic health records.  This provides immediate access to information in MyChart.  This includes consultation notes, operative notes, office notes, lab results and pathology reports.  If you have any questions about what you read please let us know at your next visit so we can discuss your concerns and take corrective action if need be.  We are right here with you.

## 2021-01-14 ENCOUNTER — Encounter (INDEPENDENT_AMBULATORY_CARE_PROVIDER_SITE_OTHER): Payer: Self-pay | Admitting: Family Medicine

## 2021-01-14 ENCOUNTER — Ambulatory Visit (INDEPENDENT_AMBULATORY_CARE_PROVIDER_SITE_OTHER): Payer: Managed Care, Other (non HMO) | Admitting: Family Medicine

## 2021-01-14 ENCOUNTER — Other Ambulatory Visit: Payer: Self-pay

## 2021-01-14 VITALS — BP 136/85 | HR 77 | Temp 97.7°F | Ht <= 58 in | Wt 216.0 lb

## 2021-01-14 DIAGNOSIS — R7303 Prediabetes: Secondary | ICD-10-CM | POA: Diagnosis not present

## 2021-01-14 DIAGNOSIS — E669 Obesity, unspecified: Secondary | ICD-10-CM

## 2021-01-14 DIAGNOSIS — F430 Acute stress reaction: Secondary | ICD-10-CM

## 2021-01-14 DIAGNOSIS — Z9189 Other specified personal risk factors, not elsewhere classified: Secondary | ICD-10-CM

## 2021-01-14 DIAGNOSIS — E559 Vitamin D deficiency, unspecified: Secondary | ICD-10-CM | POA: Diagnosis not present

## 2021-01-14 DIAGNOSIS — Z6841 Body Mass Index (BMI) 40.0 and over, adult: Secondary | ICD-10-CM

## 2021-01-14 MED ORDER — BUPROPION HCL ER (SR) 100 MG PO TB12: 100.0000 mg | ORAL_TABLET | Freq: Every morning | ORAL | 0 refills | Status: DC

## 2021-01-14 MED ORDER — METFORMIN HCL ER 750 MG PO TB24: ORAL_TABLET | ORAL | 0 refills | Status: DC

## 2021-01-14 MED ORDER — VITAMIN D (ERGOCALCIFEROL) 1.25 MG (50000 UNIT) PO CAPS: 50000.0000 [IU] | ORAL_CAPSULE | ORAL | 0 refills | Status: DC

## 2021-01-15 NOTE — Progress Notes (Signed)
Chief Complaint:   OBESITY Mary Odonnell is here to discuss her progress with her obesity treatment plan along with follow-up of her obesity related diagnoses. Mary Odonnell is on the Category 1 Plan and states she is following her eating plan approximately 20% of the time. Mary Odonnell states she is walking for 5 minutes 7 times per week.  Today's visit was #: 13 Starting weight: 225 lbs Starting date: 06/12/2020 Today's weight: 216 lbs Today's date: 01/14/2021 Total lbs lost to date: 9 Total lbs lost since last in-office visit: 0  Interim History: Mary Odonnell was off for 2 weeks from work. She was off her meal plan and able to really relax. She has meal prepped and has all, food to eat on the plan at home already. She plans to restart her plan in 5 days (Monday). She is drinking 20 oz of water per day now.   Subjective:   1. Pre-diabetes [name] has a diagnosis of pre-diabetes based on her elevated HgA1c and was informed this puts her at greater risk of developing diabetes. She continues to work on diet and exercise to decrease her risk of diabetes. She denies nausea or hypoglycemia.  2. Vitamin D deficiency Mary Odonnell is currently taking prescription vitamin D 50,000 IU each week. She denies nausea, vomiting or muscle weakness.  3. Stress reaction with emotional eating Emeree's mood is stable and she denies issues.   4. At risk for constipation Mary Odonnell is at increased risk for constipation due to restarting meal plan and having inadequate water intake.  Assessment/Plan:  No orders of the defined types were placed in this encounter.   Medications Discontinued During This Encounter  Medication Reason   buPROPion ER (WELLBUTRIN SR) 100 MG 12 hr tablet Reorder   metFORMIN (GLUCOPHAGE-XR) 750 MG 24 hr tablet Reorder   Vitamin D, Ergocalciferol, (DRISDOL) 1.25 MG (50000 UNIT) CAPS capsule Reorder     Meds ordered this encounter  Medications   buPROPion ER (WELLBUTRIN SR) 100 MG 12 hr tablet    Sig: Take  1 tablet (100 mg total) by mouth in the morning.    Dispense:  30 tablet    Refill:  0    30 d supply;  ** OV for RF **   Do not send RF request   metFORMIN (GLUCOPHAGE-XR) 750 MG 24 hr tablet    Sig: 1 po qd    Dispense:  30 tablet    Refill:  0    30 d supply;  ** OV for RF **   Do not send RF request   Vitamin D, Ergocalciferol, (DRISDOL) 1.25 MG (50000 UNIT) CAPS capsule    Sig: Take 1 capsule (50,000 Units total) by mouth every 7 (seven) days.    Dispense:  4 capsule    Refill:  0    30 d supply; OV for RF     1. Pre-diabetes Mary Odonnell will continue to work on weight loss, exercise, and decreasing simple carbohydrates to help decrease the risk of diabetes. We will refill metformin for 1 month.   - metFORMIN (GLUCOPHAGE-XR) 750 MG 24 hr tablet; 1 po qd  Dispense: 30 tablet; Refill: 0  2. Vitamin D deficiency We will refill prescription Vitamin D for 1 month. [name] will follow-up for routine testing of Vitamin D, at least 2-3 times per year to avoid over-replacement.  - Vitamin D, Ergocalciferol, (DRISDOL) 1.25 MG (50000 UNIT) CAPS capsule; Take 1 capsule (50,000 Units total) by mouth every 7 (seven) days.  Dispense: 4  capsule; Refill: 0  3. Stress reaction with emotional eating Behavior modification techniques were discussed today to help Mary Odonnell deal with her emotional/non-hunger eating behaviors. We will refill refill Wellbutrin SR for 1 month. Orders and follow up as documented in patient record.   - buPROPion ER (WELLBUTRIN SR) 100 MG 12 hr tablet; Take 1 tablet (100 mg total) by mouth in the morning.  Dispense: 30 tablet; Refill: 0  4. At risk for constipation Mary Odonnell is at increased risk for constipation due to inadequate water intake, changes in diet, and/or use of certain medications. Patient was provided with 9 minutes of counseling today regarding this condition and related ones.  We discussed preventative OTC therapies that exist such as MiraLAX, stool softeners,  etc.   We also discussed the importance of adequate fiber intake and for patient to drink 1/2 weight in ounces of water per day, unless told otherwise by a Cardiologist, Nephrologist, or another medical provider.    5. Obesity with current BMI of 45.3 Mary Odonnell is currently in the action stage of change. As such, her goal is to continue with weight loss efforts. She has agreed to the Category 1 Plan.   Exercise goals: [name] is to increase walking to 10 minutes 5 days pr week for stress management.   Behavioral modification strategies: increasing water intake, avoiding temptations, and planning for success.  [name] has agreed to follow-up with our clinic in 2 to 3 weeks. She was informed of the importance of frequent follow-up visits to maximize her success with intensive lifestyle modifications for her multiple health conditions.   Objective:   Blood pressure 136/85, pulse 77, temperature 97.7 F (36.5 C), height 4\' 10"  (1.473 m), weight 216 lb (98 kg), last menstrual period 01/24/2016, SpO2 97 %. Body mass index is 45.14 kg/m.  General: Cooperative, alert, well developed, in no acute distress. HEENT: Conjunctivae and lids unremarkable. Cardiovascular: Regular rhythm.  Lungs: Normal work of breathing. Neurologic: No focal deficits.   Lab Results  Component Value Date   CREATININE 0.73 06/12/2020   BUN 12 06/12/2020   NA 139 06/12/2020   K 4.3 06/12/2020   CL 101 06/12/2020   CO2 24 06/12/2020   Lab Results  Component Value Date   ALT 15 09/29/2020   AST 12 (A) 09/29/2020   ALKPHOS 70 06/12/2020   BILITOT 0.6 06/12/2020   Lab Results  Component Value Date   HGBA1C 6.0 09/29/2020   HGBA1C 6.3 (H) 06/12/2020   HGBA1C 6.1 (H) 08/21/2018   HGBA1C 6.1 (H) 04/11/2018   HGBA1C 6.2 (H) 01/13/2018   Lab Results  Component Value Date   INSULIN 13.2 06/12/2020   Lab Results  Component Value Date   TSH 1.10 09/29/2020   Lab Results  Component Value Date   CHOL 161  09/29/2020   HDL 60 09/29/2020   LDLCALC 88 09/29/2020   TRIG 64 09/29/2020   CHOLHDL 2.8 06/12/2020   Lab Results  Component Value Date   VD25OH 47.8 10/16/2020   VD25OH 25.4 (L) 06/12/2020   Lab Results  Component Value Date   WBC 4.5 09/29/2020   HGB 13.5 09/29/2020   HCT 42 09/29/2020   MCV 84 06/12/2020   PLT 279 09/29/2020   No results found for: IRON, TIBC, FERRITIN  Attestation Statements:   Reviewed by clinician on day of visit: allergies, medications, problem list, medical history, surgical history, family history, social history, and previous encounter notes.   Wilhemena Durie, am acting as  transcriptionist for [employer], DO.  I have reviewed the above documentation for accuracy and completeness, and I agree with the above. Marjory Sneddon, D.O.  The [place] was signed into law in 2016 which includes the topic of electronic health records.  This provides immediate access to information in MyChart.  This includes consultation notes, operative notes, office notes, lab results and pathology reports.  If you have any questions about what you read please let us know at your next visit so we can discuss your concerns and take corrective action if need be.  We are right here with you.

## 2021-01-18 ENCOUNTER — Other Ambulatory Visit (INDEPENDENT_AMBULATORY_CARE_PROVIDER_SITE_OTHER): Payer: Self-pay | Admitting: Family Medicine

## 2021-01-18 DIAGNOSIS — E559 Vitamin D deficiency, unspecified: Secondary | ICD-10-CM

## 2021-01-19 NOTE — Telephone Encounter (Signed)
LOV w/Dr. Jenetta Downer

## 2021-01-29 ENCOUNTER — Ambulatory Visit (INDEPENDENT_AMBULATORY_CARE_PROVIDER_SITE_OTHER): Payer: Managed Care, Other (non HMO) | Admitting: Family Medicine

## 2021-01-29 ENCOUNTER — Other Ambulatory Visit: Payer: Self-pay

## 2021-01-29 ENCOUNTER — Encounter (INDEPENDENT_AMBULATORY_CARE_PROVIDER_SITE_OTHER): Payer: Self-pay | Admitting: Family Medicine

## 2021-01-29 VITALS — BP 142/89 | HR 82 | Temp 98.0°F | Ht <= 58 in | Wt 216.0 lb

## 2021-01-29 DIAGNOSIS — F39 Unspecified mood [affective] disorder: Secondary | ICD-10-CM | POA: Diagnosis not present

## 2021-01-29 DIAGNOSIS — R7303 Prediabetes: Secondary | ICD-10-CM

## 2021-01-29 DIAGNOSIS — Z9189 Other specified personal risk factors, not elsewhere classified: Secondary | ICD-10-CM

## 2021-01-29 DIAGNOSIS — E669 Obesity, unspecified: Secondary | ICD-10-CM | POA: Diagnosis not present

## 2021-01-29 DIAGNOSIS — Z6841 Body Mass Index (BMI) 40.0 and over, adult: Secondary | ICD-10-CM

## 2021-01-29 DIAGNOSIS — I10 Essential (primary) hypertension: Secondary | ICD-10-CM | POA: Diagnosis not present

## 2021-02-03 DIAGNOSIS — F39 Unspecified mood [affective] disorder: Secondary | ICD-10-CM | POA: Insufficient documentation

## 2021-02-03 MED ORDER — METFORMIN HCL ER 750 MG PO TB24: ORAL_TABLET | ORAL | 0 refills | Status: DC

## 2021-02-05 NOTE — Progress Notes (Signed)
Chief Complaint:   OBESITY Mary Odonnell is here to discuss her progress with her obesity treatment plan along with follow-up of her obesity related diagnoses. Mary Odonnell is on the Category 1 Plan and states she is following her eating plan approximately 40% of the time. [name] states she is walking for 10 minutes 2 times per week.  Today's visit was #: 14 Starting weight: 225 lbs Starting date: 06/12/2020 Today's weight: 216 lbs Today's date: 02/05/2021 Total lbs lost to date: 9 Total lbs lost since last in-office visit: 0  Interim History: Mary Odonnell is getting into her meal plan as best as she can. She eats out 3 nights per week for dinner. She is unsure of the calories. She has no issues with the plan. She knows she needs to meal prep better plans to do so in the near future.  Subjective:   1. Essential hypertension [name]'s blood pressure is slightly elevated and above goal today. She is asymptomatic and she has no concerns. She is taking Norvasc, Cozaar, and Lasix, and she is tolerating her medications well.   BP Readings from Last 3 Encounters:  01/29/21 (!) 142/89  01/14/21 136/85  12/24/20 (!) 147/90   2. Pre-diabetes Mary Odonnell is on metformin 750 mg XR. She denies the need for dose or medication change today.   3. Mood disorder ([place])- emotional eating Mary Odonnell notes her Christmas stress has finally decreased some. Her kids are back at school. Her mood is well controlled and she has been trying to practice self-care more. She is on Wellbutrin and she it tolerating it well, no need for medications change.  4. At risk for malnutrition Mary Odonnell is at increased risk for malnutrition due to current dietary habits.  Assessment/Plan:  No orders of the defined types were placed in this encounter.   Medications Discontinued During This Encounter  Medication Reason   metFORMIN (GLUCOPHAGE-XR) 750 MG 24 hr tablet Reorder     Meds ordered this encounter  Medications   metFORMIN  (GLUCOPHAGE-XR) 750 MG 24 hr tablet    Sig: 1 po qd    Dispense:  30 tablet    Refill:  0    30 d supply;  ** OV for RF **   Do not send RF request     1. Essential hypertension [name] will increase her exercise, water intake and decrease her salt intake, and continue her prudent nutritional plan, and weight loss to improve blood pressure control. We will watch for signs of hypotension as she continues her lifestyle modifications.  2. Pre-diabetes We will refill metformin for 1 month. Mary Odonnell will continue to consider medication change or dose change in the future. She will continue to decrease her simple carbohydrates.  - metFORMIN (GLUCOPHAGE-XR) 750 MG 24 hr tablet; 1 po qd  Dispense: 30 tablet; Refill: 0  3. Mood disorder ([place])- emotional eating Mary Odonnell will continue to set boundaries and practice self-care each day. She is to try to increase activity to help with stress levels.  4. At risk for malnutrition Mary Odonnell was given approximately 9 minutes of counseling today regarding prevention of malnutrition and ways to meet macronutrient goals.   5. Obesity with current BMI of 45.2 Mary Odonnell is currently in the action stage of change. As such, her goal is to continue with weight loss efforts. She has agreed to the Category 1 Plan with breakfast options and keeping a food journal and adhering to recommended goals of 300-450 calories and 35+ grams of protein at supper daily.  Exercise goals: As is, increase as tolerated.   Behavioral modification strategies: meal planning and cooking strategies and planning for success.  Mary Odonnell has agreed to follow-up with our clinic in 2 to 3 weeks. She was informed of the importance of frequent follow-up visits to maximize her success with intensive lifestyle modifications for her multiple health conditions.   Objective:   Blood pressure (!) 142/89, pulse 82, temperature 98 F (36.7 C), height 4\' 10"  (1.473 m), weight 216 lb (98 kg), last menstrual  period 01/24/2016, SpO2 96 %. Body mass index is 45.14 kg/m.  General: Cooperative, alert, well developed, in no acute distress. HEENT: Conjunctivae and lids unremarkable. Cardiovascular: Regular rhythm.  Lungs: Normal work of breathing. Neurologic: No focal deficits.   Lab Results  Component Value Date   CREATININE 0.73 06/12/2020   BUN 12 06/12/2020   NA 139 06/12/2020   K 4.3 06/12/2020   CL 101 06/12/2020   CO2 24 06/12/2020   Lab Results  Component Value Date   ALT 15 09/29/2020   AST 12 (A) 09/29/2020   ALKPHOS 70 06/12/2020   BILITOT 0.6 06/12/2020   Lab Results  Component Value Date   HGBA1C 6.0 09/29/2020   HGBA1C 6.3 (H) 06/12/2020   HGBA1C 6.1 (H) 08/21/2018   HGBA1C 6.1 (H) 04/11/2018   HGBA1C 6.2 (H) 01/13/2018   Lab Results  Component Value Date   INSULIN 13.2 06/12/2020   Lab Results  Component Value Date   TSH 1.10 09/29/2020   Lab Results  Component Value Date   CHOL 161 09/29/2020   HDL 60 09/29/2020   LDLCALC 88 09/29/2020   TRIG 64 09/29/2020   CHOLHDL 2.8 06/12/2020   Lab Results  Component Value Date   VD25OH 47.8 10/16/2020   VD25OH 25.4 (L) 06/12/2020   Lab Results  Component Value Date   WBC 4.5 09/29/2020   HGB 13.5 09/29/2020   HCT 42 09/29/2020   MCV 84 06/12/2020   PLT 279 09/29/2020   No results found for: IRON, TIBC, FERRITIN  Attestation Statements:   Reviewed by clinician on day of visit: allergies, medications, problem list, medical history, surgical history, family history, social history, and previous encounter notes.   Mary Odonnell, am acting as transcriptionist for [employer], DO.  I have reviewed the above documentation for accuracy and completeness, and I agree with the above. Mary Odonnell, D.O.  The [place] was signed into law in 2016 which includes the topic of electronic health records.  This provides immediate access to information in MyChart.  This includes  consultation notes, operative notes, office notes, lab results and pathology reports.  If you have any questions about what you read please let us know at your next visit so we can discuss your concerns and take corrective action if need be.  We are right here with you.

## 2021-02-10 ENCOUNTER — Other Ambulatory Visit (INDEPENDENT_AMBULATORY_CARE_PROVIDER_SITE_OTHER): Payer: Self-pay | Admitting: Family Medicine

## 2021-02-10 DIAGNOSIS — E559 Vitamin D deficiency, unspecified: Secondary | ICD-10-CM

## 2021-02-12 ENCOUNTER — Ambulatory Visit (INDEPENDENT_AMBULATORY_CARE_PROVIDER_SITE_OTHER): Payer: Managed Care, Other (non HMO) | Admitting: Family Medicine

## 2021-02-12 ENCOUNTER — Other Ambulatory Visit: Payer: Self-pay

## 2021-02-12 ENCOUNTER — Encounter (INDEPENDENT_AMBULATORY_CARE_PROVIDER_SITE_OTHER): Payer: Self-pay | Admitting: Family Medicine

## 2021-02-12 ENCOUNTER — Other Ambulatory Visit (INDEPENDENT_AMBULATORY_CARE_PROVIDER_SITE_OTHER): Payer: Self-pay | Admitting: Family Medicine

## 2021-02-12 VITALS — BP 127/84 | HR 98 | Temp 98.0°F | Ht <= 58 in | Wt 211.0 lb

## 2021-02-12 DIAGNOSIS — F430 Acute stress reaction: Secondary | ICD-10-CM | POA: Diagnosis not present

## 2021-02-12 DIAGNOSIS — Z9189 Other specified personal risk factors, not elsewhere classified: Secondary | ICD-10-CM

## 2021-02-12 DIAGNOSIS — E559 Vitamin D deficiency, unspecified: Secondary | ICD-10-CM

## 2021-02-12 DIAGNOSIS — Z6841 Body Mass Index (BMI) 40.0 and over, adult: Secondary | ICD-10-CM

## 2021-02-12 DIAGNOSIS — E669 Obesity, unspecified: Secondary | ICD-10-CM

## 2021-02-12 DIAGNOSIS — I10 Essential (primary) hypertension: Secondary | ICD-10-CM

## 2021-02-12 MED ORDER — BUPROPION HCL ER (SR) 100 MG PO TB12: 100.0000 mg | ORAL_TABLET | Freq: Every morning | ORAL | 0 refills | Status: DC

## 2021-02-12 MED ORDER — VITAMIN D (ERGOCALCIFEROL) 1.25 MG (50000 UNIT) PO CAPS: 50000.0000 [IU] | ORAL_CAPSULE | ORAL | 0 refills | Status: DC

## 2021-02-16 NOTE — Progress Notes (Signed)
Chief Complaint:   OBESITY Mary Odonnell is here to discuss her progress with her obesity treatment plan along with follow-up of her obesity related diagnoses. Mary Odonnell is on the Category 1 Plan and keeping a food journal and adhering to recommended goals of 300-450 calories and 35+ grams protein and states she is following her eating plan approximately 65% of the time. Mary Odonnell states she is walking, weights, and jogging 5-10 minutes 1-2 times per week.  Today's visit was #: 15 Starting weight: 225 lbs Starting date: 06/12/2020 Today's weight: 211 lbs Today's date: 02/12/2021 Total lbs lost to date: 14 Total lbs lost since last in-office visit: 5  Interim History: Pt ate less and ate on plan more often and focused on getting her protein in. She worked out a little more and used [employer] a couple of days as well. She is conscientious of moving more and having less sweets.  Subjective:   1. Stress reaction with emotional eating Pt's  mood has been great. She feel great and has no issues.  2. Vitamin D deficiency She is currently taking prescription vitamin D 50,000 IU each week. She denies nausea, vomiting or muscle weakness.  3. Essential hypertension BP's at home 130's/80's. Last OV BP was 142/89. BP much better today at 127/84. She has been working on stress management more lately.  4. At risk for activity intolerance Mary Odonnell is at risk for exercise intolerance.  Assessment/Plan:   1. Stress reaction with emotional eating Behavior modification techniques were discussed today to help Mary Odonnell deal with her emotional/non-hunger eating behaviors.  Orders and follow up as documented in patient record.   Refill- buPROPion ER (WELLBUTRIN SR) 100 MG 12 hr tablet; Take 1 tablet (100 mg total) by mouth in the morning.  Dispense: 30 tablet; Refill: 0  2. Vitamin D deficiency Low Vitamin D level contributes to fatigue and are associated with obesity, breast, and colon cancer. She agrees to  continue to take prescription Vitamin D 50,000 IU every week and will follow-up for routine testing of Vitamin D, at least 2-3 times per year to avoid over-replacement.  Refill- Vitamin D, Ergocalciferol, (DRISDOL) 1.25 MG (50000 UNIT) CAPS capsule; Take 1 capsule (50,000 Units total) by mouth every 7 (seven) days.  Dispense: 4 capsule; Refill: 0  3. Essential hypertension At goal today. Drink 40-50 oz water per day and increase as tolerated.  4. At risk for activity intolerance Mary Odonnell was given approximately 9 minutes of exercise intolerance counseling today. She is 56 y.o. female and has risk factors exercise intolerance including obesity. We discussed intensive lifestyle modifications today with an emphasis on specific weight loss instructions and strategies. Kade will slowly increase activity as tolerated.  Repetitive spaced learning was employed today to elicit superior memory formation and behavioral change.  5. Obesity with current BMI of 44.2 Mary Odonnell is currently in the action stage of change. As such, her goal is to continue with weight loss efforts. She has agreed to the Category 1 Plan and keeping a food journal and adhering to recommended goals of 300-450 calories and 35+ grams protein.   Exercise goals:  Increase activity as tolerated. Continue with aerobic and resistance training.  Behavioral modification strategies: keeping healthy foods in the home, better snacking choices, and avoiding temptations.  Mary Odonnell has agreed to follow-up with our clinic in 3 weeks. She was informed of the importance of frequent follow-up visits to maximize her success with intensive lifestyle modifications for her multiple health conditions.  Objective:   Blood pressure 127/84, pulse 98, temperature 98 F (36.7 C), height 4\' 10"  (1.473 m), weight 211 lb (95.7 kg), last menstrual period 01/24/2016, SpO2 100 %. Body mass index is 44.1 kg/m.  General: Cooperative, alert, well developed, in no  acute distress. HEENT: Conjunctivae and lids unremarkable. Cardiovascular: Regular rhythm.  Lungs: Normal work of breathing. Neurologic: No focal deficits.   Lab Results  Component Value Date   CREATININE 0.73 06/12/2020   BUN 12 06/12/2020   NA 139 06/12/2020   K 4.3 06/12/2020   CL 101 06/12/2020   CO2 24 06/12/2020   Lab Results  Component Value Date   ALT 15 09/29/2020   AST 12 (A) 09/29/2020   ALKPHOS 70 06/12/2020   BILITOT 0.6 06/12/2020   Lab Results  Component Value Date   HGBA1C 6.0 09/29/2020   HGBA1C 6.3 (H) 06/12/2020   HGBA1C 6.1 (H) 08/21/2018   HGBA1C 6.1 (H) 04/11/2018   HGBA1C 6.2 (H) 01/13/2018   Lab Results  Component Value Date   INSULIN 13.2 06/12/2020   Lab Results  Component Value Date   TSH 1.10 09/29/2020   Lab Results  Component Value Date   CHOL 161 09/29/2020   HDL 60 09/29/2020   LDLCALC 88 09/29/2020   TRIG 64 09/29/2020   CHOLHDL 2.8 06/12/2020   Lab Results  Component Value Date   VD25OH 47.8 10/16/2020   VD25OH 25.4 (L) 06/12/2020   Lab Results  Component Value Date   WBC 4.5 09/29/2020   HGB 13.5 09/29/2020   HCT 42 09/29/2020   MCV 84 06/12/2020   PLT 279 09/29/2020   Attestation Statements:   Reviewed by clinician on day of visit: allergies, medications, problem list, medical history, surgical history, family history, social history, and previous encounter notes.  Coral Ceo, CMA, am acting as transcriptionist for [employer], DO.  I have reviewed the above documentation for accuracy and completeness, and I agree with the above. Marjory Sneddon, D.O.  The [place] was signed into law in 2016 which includes the topic of electronic health records.  This provides immediate access to information in MyChart.  This includes consultation notes, operative notes, office notes, lab results and pathology reports.  If you have any questions about what you read please let us know at your next  visit so we can discuss your concerns and take corrective action if need be.  We are right here with you.

## 2021-03-05 ENCOUNTER — Ambulatory Visit (INDEPENDENT_AMBULATORY_CARE_PROVIDER_SITE_OTHER): Payer: Managed Care, Other (non HMO) | Admitting: Family Medicine

## 2021-03-05 ENCOUNTER — Other Ambulatory Visit: Payer: Self-pay

## 2021-03-05 ENCOUNTER — Encounter (INDEPENDENT_AMBULATORY_CARE_PROVIDER_SITE_OTHER): Payer: Self-pay | Admitting: Family Medicine

## 2021-03-05 VITALS — BP 130/82 | HR 80 | Temp 97.7°F | Ht <= 58 in | Wt 211.0 lb

## 2021-03-05 DIAGNOSIS — E669 Obesity, unspecified: Secondary | ICD-10-CM

## 2021-03-05 DIAGNOSIS — Z6841 Body Mass Index (BMI) 40.0 and over, adult: Secondary | ICD-10-CM

## 2021-03-05 DIAGNOSIS — R7303 Prediabetes: Secondary | ICD-10-CM | POA: Diagnosis not present

## 2021-03-05 DIAGNOSIS — Z9189 Other specified personal risk factors, not elsewhere classified: Secondary | ICD-10-CM | POA: Diagnosis not present

## 2021-03-05 DIAGNOSIS — E559 Vitamin D deficiency, unspecified: Secondary | ICD-10-CM

## 2021-03-05 DIAGNOSIS — F430 Acute stress reaction: Secondary | ICD-10-CM

## 2021-03-05 DIAGNOSIS — I10 Essential (primary) hypertension: Secondary | ICD-10-CM | POA: Diagnosis not present

## 2021-03-05 MED ORDER — BUPROPION HCL ER (SR) 100 MG PO TB12: 100.0000 mg | ORAL_TABLET | Freq: Every morning | ORAL | 0 refills | Status: DC

## 2021-03-05 MED ORDER — VITAMIN D (ERGOCALCIFEROL) 1.25 MG (50000 UNIT) PO CAPS: 50000.0000 [IU] | ORAL_CAPSULE | ORAL | 0 refills | Status: DC

## 2021-03-05 NOTE — Progress Notes (Signed)
Chief Complaint:   OBESITY Mary Odonnell is here to discuss her progress with her obesity treatment plan along with follow-up of her obesity related diagnoses. Mary Odonnell is on the Category 1 Plan and keeping a food journal and adhering to recommended goals of 300-450 calories and 35+ grams of protein at supper daily and states she is following her eating plan approximately 65% of the time. Mary Odonnell states she is walking and lifting weights for 5-20 minutes 3-5 times per week.  Today's visit was #: 16 Starting weight: 225 lbs Starting date: 06/12/2020 Today's weight: 211 lbs Today's date: 03/05/2021 Total lbs lost to date: 14 Total lbs lost since last in-office visit: 0  Interim History: Mary Odonnell has stress with mother in-law's health and stress in family but she is handling it well. She is walking for stress management and also doing a great job setting boundaries.    Subjective:   1. Pre-diabetes Mary Odonnell is taking Glucophage. Last A1c was 6.0 on 09/29/2020.  2. Essential hypertension Mary Odonnell is taking Norvasc, Cozaar, and Lasix.  She also has a history of NALFD.  CMP within normal limits on 09/29/2020.no new symptoms of concerns.  No headaches, and her blood pressure is at goal today.  BP Readings from Last 3 Encounters:  03/05/21 130/82  02/12/21 127/84  01/29/21 (!) 142/89   3. Vitamin D deficiency Mary Odonnell is currently taking prescription vitamin D 50,000 IU each week.  She denies nausea, vomiting or muscle weakness.  4. Stress reaction with emotional eating Mary Odonnell feels her medications are working and no need for dose change.  5. At risk for anxiety Mary Odonnell is at risk of developing anxiety due to bouts of panic and current stress she is under.    Assessment/Plan:   Orders Placed This Encounter  Procedures   VITAMIN D 25 Hydroxy (Vit-D Deficiency, Fractures)   Hemoglobin A1c   Insulin, random   Comprehensive metabolic panel    Medications Discontinued During This Encounter   Medication Reason   Vitamin D, Ergocalciferol, (DRISDOL) 1.25 MG (50000 UNIT) CAPS capsule Reorder   buPROPion ER (WELLBUTRIN SR) 100 MG 12 hr tablet Reorder     Meds ordered this encounter  Medications   buPROPion ER (WELLBUTRIN SR) 100 MG 12 hr tablet    Sig: Take 1 tablet (100 mg total) by mouth in the morning.    Dispense:  30 tablet    Refill:  0    30 d supply;  ** OV for RF **   Do not send RF request   Vitamin D, Ergocalciferol, (DRISDOL) 1.25 MG (50000 UNIT) CAPS capsule    Sig: Take 1 capsule (50,000 Units total) by mouth every 7 (seven) days.    Dispense:  4 capsule    Refill:  0    30 d supply; OV for RF     1. Pre-diabetes We will check labs today. Mary Odonnell will continue metformin, decrease simple carbohydrates, and increase protein to help decrease the risk of diabetes.   - Hemoglobin A1c - Insulin, random   2. Essential hypertension We will check labs today. BP at goal.  Mary Odonnell will decrease salt and continue to follow her prudent nutritional plan to improve blood pressure control. We will watch for signs of hypotension as she continues her lifestyle modifications.  - Comprehensive metabolic panel   3. Vitamin D deficiency We will check labs today, and we will refill prescription Vit D for 1 month.  - Reiterated importance of vitamin D (as well  as calcium) to their health and wellbeing.  - Reminded Mary Odonnell that weight loss will likely improve availability of vitamin D, thus encouraged her to continue with meal plan and their weight loss efforts to further improve this condition. - I recommend patient continue to take weekly prescription vit D 50,000 IU - Informed patient this may be a lifelong thing, and she was encouraged to continue to take the medicine until told otherwise.   - we will need to monitor levels regularly (every 3-4 mo on average) to keep levels within normal limits.  - weight loss will likely improve availability of vitamin D, thus  encouraged Mary Odonnell to continue with meal plan and their weight loss efforts to further improve this condition - pt's questions and concerns regarding this condition addressed.   - Vitamin D, Ergocalciferol, (DRISDOL) 1.25 MG (50000 UNIT) CAPS capsule; Take 1 capsule (50,000 Units total) by mouth every 7 (seven) days.  Dispense: 4 capsule; Refill: 0 - VITAMIN D 25 Hydroxy (Vit-D Deficiency, Fractures)   4. Stress reaction with emotional eating We will refill bupropion for 1 month as same dose. 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 Orders and follow up as documented in patient record.   - buPROPion ER (WELLBUTRIN SR) 100 MG 12 hr tablet; Take 1 tablet (100 mg total) by mouth in the morning.  Dispense: 30 tablet; Refill: 0   5. At risk for anxiety Mary Odonnell was given approximately 10 minutes of anxiety risk counseling today. The patient has risk factors for this condition.  We discussed the importance of a healthy work/life balance, a healthy relationship with food, and a good support system.  We discussed various strategies to help cope with these emotions as well.  I recommended counseling, meditation / prayer, healthy eating habits, sleep hygiene, and exercising to help manage these feelings.     6. Obesity with current BMI of 44.2 Mary Odonnell is currently in the action stage of change. As such, her goal is to continue with weight loss efforts. She has agreed to the Category 1 Plan and keeping a food journal and adhering to recommended goals of 300-450 calories and 35+ grams of protein at supper daily.   Exercise goals: As is.  Behavioral modification strategies: keeping healthy foods in the home, emotional eating strategies, and planning for success.  Mary Odonnell has agreed to follow-up with our clinic in 3 weeks. She was informed of the importance of frequent follow-up visits to maximize her success with intensive lifestyle modifications for  her multiple health conditions.   Mary Odonnell was informed we would discuss her lab results at her next visit unless there is a critical issue that needs to be addressed sooner. Mary Odonnell agreed to keep her next visit at the agreed upon time to discuss these results.    Objective:   Blood pressure 130/82, pulse 80, temperature 97.7 F (36.5 C), height 4\' 10"  (1.473 m), weight 211 lb (95.7 kg), last menstrual period 01/24/2016, SpO2 100 %. Body mass index is 44.1 kg/m. General: Cooperative, alert, well developed, in no acute distress. HEENT: Conjunctivae and lids unremarkable. Cardiovascular: Regular rhythm.  Lungs: Normal work of breathing. Neurologic: No focal deficits.   Lab Results  Component Value Date   CREATININE 0.73 06/12/2020   BUN 12 06/12/2020   NA 139 06/12/2020   K 4.3 06/12/2020   CL 101 06/12/2020   CO2 24 06/12/2020   Lab Results  Component Value Date  ALT 15 09/29/2020   AST 12 (A) 09/29/2020   ALKPHOS 70 06/12/2020   BILITOT 0.6 06/12/2020   Lab Results  Component Value Date   HGBA1C 6.0 09/29/2020   HGBA1C 6.3 (H) 06/12/2020   HGBA1C 6.1 (H) 08/21/2018   HGBA1C 6.1 (H) 04/11/2018   HGBA1C 6.2 (H) 01/13/2018   Lab Results  Component Value Date   INSULIN 13.2 06/12/2020   Lab Results  Component Value Date   TSH 1.10 09/29/2020   Lab Results  Component Value Date   CHOL 161 09/29/2020   HDL 60 09/29/2020   LDLCALC 88 09/29/2020   TRIG 64 09/29/2020   CHOLHDL 2.8 06/12/2020   Lab Results  Component Value Date   VD25OH 47.8 10/16/2020   VD25OH 25.4 (L) 06/12/2020   Lab Results  Component Value Date   WBC 4.5 09/29/2020   HGB 13.5 09/29/2020   HCT 42 09/29/2020   MCV 84 06/12/2020   PLT 279 09/29/2020   No results found for: IRON, TIBC, FERRITIN  Attestation Statements:   Reviewed by clinician on day of visit: allergies, medications, problem list, medical history, surgical history, family history, social history, and previous encounter  notes.  Wilhemena Durie, am acting as transcriptionist for [employer], DO.  I have reviewed the above documentation for accuracy and completeness, and I agree with the above. Marjory Sneddon, D.O.  The [place] was signed into law in 2016 which includes the topic of electronic health records.  This provides immediate access to information in MyChart.  This includes consultation notes, operative notes, office notes, lab results and pathology reports.  If you have any questions about what you read please let us know at your next visit so we can discuss your concerns and take corrective action if need be.  We are right here with you.

## 2021-03-06 LAB — COMPREHENSIVE METABOLIC PANEL
ALT: 13 IU/L (ref 0–32)
AST: 15 IU/L (ref 0–40)
Albumin/Globulin Ratio: 1.6 (ref 1.2–2.2)
Albumin: 4.4 g/dL (ref 3.8–4.9)
Alkaline Phosphatase: 74 IU/L (ref 44–121)
BUN/Creatinine Ratio: 13 (ref 9–23)
BUN: 12 mg/dL (ref 6–24)
Bilirubin Total: 0.6 mg/dL (ref 0.0–1.2)
CO2: 24 mmol/L (ref 20–29)
Calcium: 9 mg/dL (ref 8.7–10.2)
Chloride: 101 mmol/L (ref 96–106)
Creatinine, Ser: 0.89 mg/dL (ref 0.57–1.00)
Globulin, Total: 2.7 g/dL (ref 1.5–4.5)
Glucose: 93 mg/dL (ref 70–99)
Potassium: 4.3 mmol/L (ref 3.5–5.2)
Sodium: 141 mmol/L (ref 134–144)
Total Protein: 7.1 g/dL (ref 6.0–8.5)
eGFR: 77 mL/min/{1.73_m2} (ref >59)

## 2021-03-06 LAB — VITAMIN D 25 HYDROXY (VIT D DEFICIENCY, FRACTURES): Vit D, 25-Hydroxy: 47.9 ng/mL (ref 30.0–100.0)

## 2021-03-06 LAB — INSULIN, RANDOM: INSULIN: 13.9 u[IU]/mL (ref 2.6–24.9)

## 2021-03-06 LAB — HEMOGLOBIN A1C
Est. average glucose Bld gHb Est-mCnc: 126 mg/dL
Hgb A1c MFr Bld: 6 % — ABNORMAL HIGH (ref 4.8–5.6)

## 2021-03-26 ENCOUNTER — Other Ambulatory Visit: Payer: Self-pay

## 2021-03-26 ENCOUNTER — Encounter (INDEPENDENT_AMBULATORY_CARE_PROVIDER_SITE_OTHER): Payer: Self-pay | Admitting: Family Medicine

## 2021-03-26 ENCOUNTER — Ambulatory Visit (INDEPENDENT_AMBULATORY_CARE_PROVIDER_SITE_OTHER): Payer: Managed Care, Other (non HMO) | Admitting: Family Medicine

## 2021-03-26 ENCOUNTER — Other Ambulatory Visit (INDEPENDENT_AMBULATORY_CARE_PROVIDER_SITE_OTHER): Payer: Self-pay | Admitting: Family Medicine

## 2021-03-26 VITALS — BP 142/89 | HR 67 | Temp 98.3°F | Ht <= 58 in | Wt 209.0 lb

## 2021-03-26 DIAGNOSIS — F430 Acute stress reaction: Secondary | ICD-10-CM | POA: Diagnosis not present

## 2021-03-26 DIAGNOSIS — E669 Obesity, unspecified: Secondary | ICD-10-CM

## 2021-03-26 DIAGNOSIS — R7303 Prediabetes: Secondary | ICD-10-CM

## 2021-03-26 DIAGNOSIS — Z6841 Body Mass Index (BMI) 40.0 and over, adult: Secondary | ICD-10-CM

## 2021-03-26 DIAGNOSIS — Z9189 Other specified personal risk factors, not elsewhere classified: Secondary | ICD-10-CM

## 2021-03-26 DIAGNOSIS — E66813 Obesity, class 3: Secondary | ICD-10-CM

## 2021-03-26 DIAGNOSIS — E559 Vitamin D deficiency, unspecified: Secondary | ICD-10-CM

## 2021-03-26 MED ORDER — METFORMIN HCL ER 750 MG PO TB24: ORAL_TABLET | ORAL | 0 refills | Status: DC

## 2021-03-26 MED ORDER — BUPROPION HCL ER (SR) 100 MG PO TB12: 100.0000 mg | ORAL_TABLET | Freq: Every morning | ORAL | 0 refills | Status: DC

## 2021-03-26 MED ORDER — VITAMIN D (ERGOCALCIFEROL) 1.25 MG (50000 UNIT) PO CAPS: 50000.0000 [IU] | ORAL_CAPSULE | ORAL | 0 refills | Status: DC

## 2021-03-28 ENCOUNTER — Other Ambulatory Visit (INDEPENDENT_AMBULATORY_CARE_PROVIDER_SITE_OTHER): Payer: Self-pay | Admitting: Family Medicine

## 2021-03-28 DIAGNOSIS — R7303 Prediabetes: Secondary | ICD-10-CM

## 2021-04-01 NOTE — Progress Notes (Signed)
? ? ? ?Chief Complaint:  ? ?OBESITY ?Mary Odonnell is here to discuss her progress with her obesity treatment plan along with follow-up of her obesity related diagnoses. [name] is on the Category 1 Plan and keeping a food journal and adhering to recommended goals of 300-450 calories and 35+ grams of protein at supper and states she is following her eating plan approximately 30% of the time. [name] states she is walking for 30 minutes 2 times per week. ? ?Today's visit was #: 20 ?Starting weight: 225 lbs ?Starting date: 06/12/2020 ?Today's weight: 209 lbs ?Today's date: 03/26/2021 ?Total lbs lost to date: 16 lbs ?Total lbs lost since last in-office visit: 2 lbs ? ?Interim History: [name] has been ill and has not been eating as much as she normally does.  She is trying to push water and stay hydrated.  No issues or concerns.  When on plan, no hunger or cravings.  She has done little exercise since she got sick.  She is here to review labs. ? ?Subjective:  ? ?1. Pre-diabetes ?Worsening.  Discussed labs with patient today.  ?FI - worsened.  A1c did not change from prior at 6.0. ?[name] has a diagnosis of prediabetes based on her elevated HgA1c and was informed this puts her at greater risk of developing diabetes. She continues to work on diet and exercise to decrease her risk of diabetes. She denies nausea or hypoglycemia. ? ?2. Vitamin D deficiency ?Discussed labs with patient today.  ?She is currently taking prescription vitamin D 50,000 IU each week. She denies nausea, vomiting or muscle weakness. ? ?3. Stress reaction with emotional eating ?Kinaya says she is still setting boundaries. ? ?4. At risk for diabetes mellitus ?[name] is at higher than average risk for developing diabetes due to prediabetes and worsening FI level.  ? ?Assessment/Plan:  ?No orders of the defined types were placed in this encounter. ? ? ?Medications Discontinued During This Encounter  ?Medication Reason  ? buPROPion ER (WELLBUTRIN SR) 100 MG 12 hr  tablet Reorder  ? Vitamin D, Ergocalciferol, (DRISDOL) 1.25 MG (50000 UNIT) CAPS capsule Reorder  ? metFORMIN (GLUCOPHAGE-XR) 750 MG 24 hr tablet Reorder  ?  ? ?Meds ordered this encounter  ?Medications  ? buPROPion ER (WELLBUTRIN SR) 100 MG 12 hr tablet  ?  Sig: Take 1 tablet (100 mg total) by mouth in the morning.  ?  Dispense:  30 tablet  ?  Refill:  0  ?  30 d supply;  ** OV for RF **   Do not send RF request  ? Vitamin D, Ergocalciferol, (DRISDOL) 1.25 MG (50000 UNIT) CAPS capsule  ?  Sig: Take 1 capsule (50,000 Units total) by mouth every 7 (seven) days.  ?  Dispense:  4 capsule  ?  Refill:  0  ?  30 d supply; OV for RF  ? metFORMIN (GLUCOPHAGE-XR) 750 MG 24 hr tablet  ?  Sig: 1 po qd  ?  Dispense:  30 tablet  ?  Refill:  0  ?  30 d supply;  ** OV for RF **   Do not send RF request  ?  ? ?1. Pre-diabetes ?[name] will continue to work on weight loss, exercise, and decreasing simple carbohydrates to help decrease the risk of diabetes.  ?Refill metformin XR 750 mg once daily. ?FI a little worse.  No change in A1c.  In the future will consider adding Ozempic. ? ?- Refill metFORMIN (GLUCOPHAGE-XR) 750 MG 24 hr tablet; 1 po qd  Dispense: 30 tablet; Refill: 0 ? ?2. Vitamin D deficiency ?At goal at 28.  Low Vitamin D level contributes to fatigue and are associated with obesity, breast, and colon cancer. She agrees to continue to take prescription Vitamin D '@50'$ ,000 IU every week and will follow-up for routine testing of Vitamin D, at least 2-3 times per year to avoid over-replacement. ? ?Refill ergocalciferol 50,000 IU once weekly, as per below. ? ?- Refill Vitamin D, Ergocalciferol, (DRISDOL) 1.25 MG (50000 UNIT) CAPS capsule; Take 1 capsule (50,000 Units total) by mouth every 7 (seven) days.  Dispense: 4 capsule; Refill: 0 ? ?3. Stress reaction with emotional eating ?Refill bupropion at same dose. ?Mood stable and she is doing little to no emotional eating.  Handouts given on emotional eating and mindful eating. ? ?-  Refill buPROPion ER (WELLBUTRIN SR) 100 MG 12 hr tablet; Take 1 tablet (100 mg total) by mouth in the morning.  Dispense: 30 tablet; Refill: 0 ? ?4. At risk for diabetes mellitus ?[name] was given approximately 9 minutes of diabetic education and counseling today. We discussed intensive lifestyle modifications today with an emphasis on weight loss as well as increasing exercise and decreasing simple carbohydrates in her diet. We also reviewed medication options with an emphasis on risk versus benefits of those discussed. ? ?Repetitive spaced learning was employed today to elicit superior memory formation and behavioral change. ? ?5. Obesity with current BMI of 43.8 ? ?Shakirra is currently in the action stage of change. As such, her goal is to continue with weight loss efforts. She has agreed to the Category 1 Plan and keeping a food journal and adhering to recommended goals of 300-450 calories and 40+ grams of protein at supper.  ? ?Exercise goals:  As is. ? ?Behavioral modification strategies: increasing lean protein intake, decreasing simple carbohydrates, avoiding temptations, and planning for success. ? ?[name] has agreed to follow-up with our clinic in 3 weeks. She was informed of the importance of frequent follow-up visits to maximize her success with intensive lifestyle modifications for her multiple health conditions.  ? ?Objective:  ? ?Blood pressure (!) 142/89, pulse 67, temperature 98.3 ?F (36.8 ?C), height '4\' 10"'$  (1.473 m), weight 209 lb (94.8 kg), last menstrual period 01/24/2016, SpO2 97 %. ?Body mass index is 43.68 kg/m?. ? ?General: Cooperative, alert, well developed, in no acute distress. ?HEENT: Conjunctivae and lids unremarkable. ?Cardiovascular: Regular rhythm.  ?Lungs: Normal work of breathing. ?Neurologic: No focal deficits.  ? ?Lab Results  ?Component Value Date  ? CREATININE 0.89 03/05/2021  ? BUN 12 03/05/2021  ? NA 141 03/05/2021  ? K 4.3 03/05/2021  ? CL 101 03/05/2021  ? CO2 24 03/05/2021   ? ?Lab Results  ?Component Value Date  ? ALT 13 03/05/2021  ? AST 15 03/05/2021  ? ALKPHOS 74 03/05/2021  ? BILITOT 0.6 03/05/2021  ? ?Lab Results  ?Component Value Date  ? HGBA1C 6.0 (H) 03/05/2021  ? HGBA1C 6.0 09/29/2020  ? HGBA1C 6.3 (H) 06/12/2020  ? HGBA1C 6.1 (H) 08/21/2018  ? HGBA1C 6.1 (H) 04/11/2018  ? ?Lab Results  ?Component Value Date  ? INSULIN 13.9 03/05/2021  ? INSULIN 13.2 06/12/2020  ? ?Lab Results  ?Component Value Date  ? TSH 1.10 09/29/2020  ? ?Lab Results  ?Component Value Date  ? CHOL 161 09/29/2020  ? HDL 60 09/29/2020  ? [place] 88 09/29/2020  ? TRIG 64 09/29/2020  ? CHOLHDL 2.8 06/12/2020  ? ?Lab Results  ?Component Value Date  ? [place]  47.9 03/05/2021  ? VD25OH 47.8 10/16/2020  ? VD25OH 25.4 (L) 06/12/2020  ? ?Lab Results  ?Component Value Date  ? WBC 4.5 09/29/2020  ? HGB 13.5 09/29/2020  ? HCT 42 09/29/2020  ? MCV 84 06/12/2020  ? PLT 279 09/29/2020  ? ?Attestation Statements:  ? ?Reviewed by clinician on day of visit: allergies, medications, problem list, medical history, surgical history, family history, social history, and previous encounter notes. ? ?I, Water quality scientist, CMA, am acting as transcriptionist for [employer], DO. ? ?I have reviewed the above documentation for accuracy and completeness, and I agree with the above. Marjory Sneddon, D.O. ? ?The [place] was signed into law in 2016 which includes the topic of electronic health records.  This provides immediate access to information in MyChart.  This includes consultation notes, operative notes, office notes, lab results and pathology reports.  If you have any questions about what you read please let us know at your next visit so we can discuss your concerns and take corrective action if need be.  We are right here with you. ? ?

## 2021-04-16 ENCOUNTER — Encounter (INDEPENDENT_AMBULATORY_CARE_PROVIDER_SITE_OTHER): Payer: Self-pay | Admitting: Family Medicine

## 2021-04-16 ENCOUNTER — Ambulatory Visit (INDEPENDENT_AMBULATORY_CARE_PROVIDER_SITE_OTHER): Payer: Managed Care, Other (non HMO) | Admitting: Family Medicine

## 2021-04-16 VITALS — BP 124/81 | HR 83 | Temp 98.0°F | Ht <= 58 in | Wt 209.0 lb

## 2021-04-16 DIAGNOSIS — E559 Vitamin D deficiency, unspecified: Secondary | ICD-10-CM

## 2021-04-16 DIAGNOSIS — F430 Acute stress reaction: Secondary | ICD-10-CM

## 2021-04-16 DIAGNOSIS — Z6841 Body Mass Index (BMI) 40.0 and over, adult: Secondary | ICD-10-CM

## 2021-04-16 DIAGNOSIS — J069 Acute upper respiratory infection, unspecified: Secondary | ICD-10-CM | POA: Diagnosis not present

## 2021-04-16 DIAGNOSIS — J3089 Other allergic rhinitis: Secondary | ICD-10-CM | POA: Diagnosis not present

## 2021-04-16 DIAGNOSIS — E669 Obesity, unspecified: Secondary | ICD-10-CM

## 2021-04-16 DIAGNOSIS — Z9189 Other specified personal risk factors, not elsewhere classified: Secondary | ICD-10-CM | POA: Diagnosis not present

## 2021-04-16 MED ORDER — FLUTICASONE PROPIONATE 50 MCG/ACT NA SUSP: NASAL | 0 refills | Status: DC

## 2021-04-16 MED ORDER — VITAMIN D (ERGOCALCIFEROL) 1.25 MG (50000 UNIT) PO CAPS: 50000.0000 [IU] | ORAL_CAPSULE | ORAL | 0 refills | Status: DC

## 2021-04-16 MED ORDER — BUPROPION HCL ER (SR) 100 MG PO TB12: 100.0000 mg | ORAL_TABLET | Freq: Every morning | ORAL | 0 refills | Status: DC

## 2021-04-16 MED ORDER — LEVOCETIRIZINE DIHYDROCHLORIDE 5 MG PO TABS: 5.0000 mg | ORAL_TABLET | Freq: Every evening | ORAL | 0 refills | Status: DC

## 2021-04-18 ENCOUNTER — Other Ambulatory Visit (INDEPENDENT_AMBULATORY_CARE_PROVIDER_SITE_OTHER): Payer: Self-pay | Admitting: Family Medicine

## 2021-04-18 DIAGNOSIS — E559 Vitamin D deficiency, unspecified: Secondary | ICD-10-CM

## 2021-04-20 NOTE — Progress Notes (Signed)
? ? ? ?Chief Complaint:  ? ?OBESITY ?Mary Odonnell is here to discuss her progress with her obesity treatment plan along with follow-up of her obesity related diagnoses. Mary Odonnell is on the Category 1 Plan and keeping a food journal and adhering to recommended goals of 300-450 calories and 40+ grams of protein daily and states she is following her eating plan approximately 30-40% of the time. Mary Odonnell states she is walking for 20 minutes 3 times per week. ? ?Today's visit was #: 18 ?Starting weight: 225 lbs ?Starting date: 06/12/2020 ?Today's weight: 209 lbs ?Today's date: 04/16/2021 ?Total lbs lost to date: 6 ?Total lbs lost since last in-office visit: 0 ? ?Interim History: Mary Odonnell is unable to follow her meal plan much due to recent URI symptoms, sore throat, cough, runny nose, and head congestion. She was given an antibiotic last night at an urgent care.  ? ?Subjective:  ? ?1. Upper respiratory tract infection, unspecified type ?Mary Odonnell notes fever, and yesterday her temperature was 100+.  ? ?2. Environmental and seasonal allergies ?Mary Odonnell is on Zyrtec OTC.  ? ?3. Vitamin D deficiency ?She is currently taking prescription vitamin D 50,000 IU each week. She denies nausea, vomiting or muscle weakness. ? ?4. Stress reaction with emotional eating ?Mary Odonnell is feeling emotionally great, with dancing and singing around work.  ? ?5. At risk for malnutrition ?Mary Odonnell is at increased risk for malnutrition due to feeling sick and inadequate intake. ? ?Assessment/Plan:  ?No orders of the defined types were placed in this encounter. ? ? ?Medications Discontinued During This Encounter  ?Medication Reason  ? norethindrone (AYGESTIN) 5 MG tablet Discontinued by provider  ? cetirizine (ZYRTEC) 10 MG tablet   ? buPROPion ER (WELLBUTRIN SR) 100 MG 12 hr tablet Reorder  ? Vitamin D, Ergocalciferol, (DRISDOL) 1.25 MG (50000 UNIT) CAPS capsule Reorder  ?  ? ?Meds ordered this encounter  ?Medications  ? buPROPion ER (WELLBUTRIN SR) 100 MG 12 hr tablet  ?   Sig: Take 1 tablet (100 mg total) by mouth in the morning.  ?  Dispense:  30 tablet  ?  Refill:  0  ?  30 d supply;  ** OV for RF **   Do not send RF request  ? Vitamin D, Ergocalciferol, (DRISDOL) 1.25 MG (50000 UNIT) CAPS capsule  ?  Sig: Take 1 capsule (50,000 Units total) by mouth every 7 (seven) days.  ?  Dispense:  4 capsule  ?  Refill:  0  ?  30 d supply; OV for RF  ? levocetirizine (XYZAL) 5 MG tablet  ?  Sig: Take 1 tablet (5 mg total) by mouth every evening.  ?  Dispense:  30 tablet  ?  Refill:  0  ? fluticasone (FLONASE) 50 MCG/ACT nasal spray  ?  Sig: 1 spray each nostril following sinus rinses twice daily  ?  Dispense:  16 g  ?  Refill:  0  ?  ? ?1. Upper respiratory tract infection, unspecified type ?Mary Odonnell will continue her antibiotics, ad start sinus rinses BID followed by Flonase 1 spray in each nostril twice daily.  ? ?- fluticasone (FLONASE) 50 MCG/ACT nasal spray; 1 spray each nostril following sinus rinses twice daily  Dispense: 16 g; Refill: 0 ? ?2. Environmental and seasonal allergies ?Mary Odonnell agreed to discontinue Zyrtec, and start Xyzal 5 mg daily with no refills. ? ?- levocetirizine (XYZAL) 5 MG tablet; Take 1 tablet (5 mg total) by mouth every evening.  Dispense: 30 tablet; Refill: 0 ? ?3. Vitamin  D deficiency ?We will refill prescription Vitamin D 50,000 IU every week and will follow-up for routine testing of Vitamin D, at least 2-3 times per year to avoid over-replacement. ? ?- Vitamin D, Ergocalciferol, (DRISDOL) 1.25 MG (50000 UNIT) CAPS capsule; Take 1 capsule (50,000 Units total) by mouth every 7 (seven) days.  Dispense: 4 capsule; Refill: 0 ? ?4. Stress reaction with emotional eating ?We will refill Wellbutrin SR for 1 month. Behavior modification techniques were discussed today to help Mary Odonnell deal with her emotional/non-hunger eating behaviors.  Orders and follow up as documented in patient record.  ? ?- buPROPion ER (WELLBUTRIN SR) 100 MG 12 hr tablet; Take 1 tablet (100 mg total)  by mouth in the morning.  Dispense: 30 tablet; Refill: 0 ? ?5. At risk for malnutrition ?Mary Odonnell was given approximately >8 minutes of counseling today regarding prevention of malnutrition and ways to meet macronutrient goals..  ? ?6. Obesity with current BMI of 43.8 ?Mary Odonnell is currently in the action stage of change. As such, her goal is to continue with weight loss efforts. She has agreed to the Category 1 Plan and keeping a food journal and adhering to recommended goals of 300-450 calories and 40+ grams of protein at supper daily.  ? ?Exercise goals: For substantial health benefits, adults should do at least 150 minutes (2 hours and 30 minutes) a week of moderate-intensity, or 75 minutes (1 hour and 15 minutes) a week of vigorous-intensity aerobic physical activity, or an equivalent combination of moderate- and vigorous-intensity aerobic activity. Aerobic activity should be performed in episodes of at least 10 minutes, and preferably, it should be spread throughout the week. Increase as tolerated. ? ?Behavioral modification strategies: increasing water intake and planning for success. ? ?Mary Odonnell has agreed to follow-up with our clinic in 3 weeks. She was informed of the importance of frequent follow-up visits to maximize her success with intensive lifestyle modifications for her multiple health conditions.  ? ?Objective:  ? ?Blood pressure 124/81, pulse 83, temperature 98 ?F (36.7 ?C), height '4\' 10"'$  (1.473 m), weight 209 lb (94.8 kg), last menstrual period 01/24/2016, SpO2 98 %. ?Body mass index is 43.68 kg/m?. ? ?General: Cooperative, alert, well developed, in no acute distress. ?HEENT: Conjunctivae and lids unremarkable. ?Cardiovascular: Regular rhythm.  ?Lungs: Normal work of breathing. ?Neurologic: No focal deficits.  ? ?Lab Results  ?Component Value Date  ? CREATININE 0.89 03/05/2021  ? BUN 12 03/05/2021  ? NA 141 03/05/2021  ? K 4.3 03/05/2021  ? CL 101 03/05/2021  ? CO2 24 03/05/2021  ? ?Lab Results   ?Component Value Date  ? ALT 13 03/05/2021  ? AST 15 03/05/2021  ? ALKPHOS 74 03/05/2021  ? BILITOT 0.6 03/05/2021  ? ?Lab Results  ?Component Value Date  ? HGBA1C 6.0 (H) 03/05/2021  ? HGBA1C 6.0 09/29/2020  ? HGBA1C 6.3 (H) 06/12/2020  ? HGBA1C 6.1 (H) 08/21/2018  ? HGBA1C 6.1 (H) 04/11/2018  ? ?Lab Results  ?Component Value Date  ? INSULIN 13.9 03/05/2021  ? INSULIN 13.2 06/12/2020  ? ?Lab Results  ?Component Value Date  ? TSH 1.10 09/29/2020  ? ?Lab Results  ?Component Value Date  ? CHOL 161 09/29/2020  ? HDL 60 09/29/2020  ? [place] 88 09/29/2020  ? TRIG 64 09/29/2020  ? CHOLHDL 2.8 06/12/2020  ? ?Lab Results  ?Component Value Date  ? VD25OH 47.9 03/05/2021  ? VD25OH 47.8 10/16/2020  ? VD25OH 25.4 (L) 06/12/2020  ? ?Lab Results  ?Component Value Date  ?  WBC 4.5 09/29/2020  ? HGB 13.5 09/29/2020  ? HCT 42 09/29/2020  ? MCV 84 06/12/2020  ? PLT 279 09/29/2020  ? ?No results found for: IRON, TIBC, FERRITIN ? ?Attestation Statements:  ? ?Reviewed by clinician on day of visit: allergies, medications, problem list, medical history, surgical history, family history, social history, and previous encounter notes. ? ? ?I, Trixie Dredge, am acting as transcriptionist for [employer], DO. ? ?I have reviewed the above documentation for accuracy and completeness, and I agree with the above. Marjory Sneddon, D.O. ? ?The [place] was signed into law in 2016 which includes the topic of electronic health records.  This provides immediate access to information in MyChart.  This includes consultation notes, operative notes, office notes, lab results and pathology reports.  If you have any questions about what you read please let us know at your next visit so we can discuss your concerns and take corrective action if need be.  We are right here with you. ? ? ?

## 2021-05-06 ENCOUNTER — Other Ambulatory Visit (INDEPENDENT_AMBULATORY_CARE_PROVIDER_SITE_OTHER): Payer: Self-pay | Admitting: Family Medicine

## 2021-05-06 DIAGNOSIS — E559 Vitamin D deficiency, unspecified: Secondary | ICD-10-CM

## 2021-05-07 ENCOUNTER — Ambulatory Visit (INDEPENDENT_AMBULATORY_CARE_PROVIDER_SITE_OTHER): Payer: Managed Care, Other (non HMO) | Admitting: Family Medicine

## 2021-05-07 ENCOUNTER — Encounter (INDEPENDENT_AMBULATORY_CARE_PROVIDER_SITE_OTHER): Payer: Self-pay | Admitting: Family Medicine

## 2021-05-07 VITALS — BP 138/83 | HR 88 | Temp 98.1°F | Ht <= 58 in | Wt 210.0 lb

## 2021-05-07 DIAGNOSIS — E669 Obesity, unspecified: Secondary | ICD-10-CM

## 2021-05-07 DIAGNOSIS — Z6841 Body Mass Index (BMI) 40.0 and over, adult: Secondary | ICD-10-CM

## 2021-05-07 DIAGNOSIS — F430 Acute stress reaction: Secondary | ICD-10-CM

## 2021-05-07 DIAGNOSIS — R7303 Prediabetes: Secondary | ICD-10-CM

## 2021-05-07 DIAGNOSIS — J069 Acute upper respiratory infection, unspecified: Secondary | ICD-10-CM

## 2021-05-07 DIAGNOSIS — Z9189 Other specified personal risk factors, not elsewhere classified: Secondary | ICD-10-CM

## 2021-05-07 DIAGNOSIS — J3089 Other allergic rhinitis: Secondary | ICD-10-CM | POA: Diagnosis not present

## 2021-05-07 DIAGNOSIS — I10 Essential (primary) hypertension: Secondary | ICD-10-CM | POA: Diagnosis not present

## 2021-05-07 MED ORDER — BUPROPION HCL ER (SR) 100 MG PO TB12: 100.0000 mg | ORAL_TABLET | Freq: Every morning | ORAL | 0 refills | Status: DC

## 2021-05-07 MED ORDER — FLUTICASONE PROPIONATE 50 MCG/ACT NA SUSP: NASAL | 0 refills | Status: DC

## 2021-05-07 MED ORDER — LEVOCETIRIZINE DIHYDROCHLORIDE 5 MG PO TABS: 5.0000 mg | ORAL_TABLET | Freq: Every evening | ORAL | 0 refills | Status: DC

## 2021-05-08 ENCOUNTER — Other Ambulatory Visit (INDEPENDENT_AMBULATORY_CARE_PROVIDER_SITE_OTHER): Payer: Self-pay | Admitting: Family Medicine

## 2021-05-08 DIAGNOSIS — J3089 Other allergic rhinitis: Secondary | ICD-10-CM

## 2021-05-25 NOTE — Progress Notes (Signed)
? ? ? ?Chief Complaint:  ? ?OBESITY ?Mary Odonnell is here to discuss her progress with her obesity treatment plan along with follow-up of her obesity related diagnoses. Jing is on the Category 1 Plan and keeping a food journal and adhering to recommended goals of 300-450 calories and 40+ grams of protein at supper daily and states she is following her eating plan approximately 45% of the time. Mary Odonnell states she is walking for 20 minutes 3 times per week. ? ?Today's visit was #: 19 ?Starting weight: 225 lbs ?Starting date: 06/12/2020 ?Today's weight: 210 lbs ?Today's date: 05/07/2021 ?Total lbs lost to date: 28 ?Total lbs lost since last in-office visit: 0 ? ?Interim History: Mary Odonnell is here for a follow up office visit.  We reviewed her meal plan and all questions were answered.  Patient's food recall appears to be accurate and consistent with what is on plan when she is following it.   When eating on plan, her hunger and cravings are well controlled.   ? ?Subjective:  ? ?1. Benign essential hypertension ?Blood pressure at home is 140/90's. She checks it every other day. Taking Lasix, Norvasc, and losartan. Lowest blood pressure is 132/80 and highest 146/98. ? ?2. Stress reaction with emotional eating ?Mood is stable and feeling well lately. No issues. ? ?3. Environmental and seasonal allergies ?Mary Odonnell is tolerating medication(s) well without side effects.  Medication compliance is good as patient endorses taking it as prescribed.  The patient denies additional concerns regarding this condition.     ? ?4. At risk for dehydration ?Mary Odonnell is at risk for dehydration due to inadequate water intake. ? ?Assessment/Plan:  ?No orders of the defined types were placed in this encounter. ? ? ?Medications Discontinued During This Encounter  ?Medication Reason  ? amoxicillin-clavulanate (AUGMENTIN) 875-125 MG tablet Patient Preference  ? buPROPion ER (WELLBUTRIN SR) 100 MG 12 hr tablet Reorder  ? levocetirizine (XYZAL) 5  MG tablet Reorder  ? fluticasone (FLONASE) 50 MCG/ACT nasal spray Reorder  ?  ? ?Meds ordered this encounter  ?Medications  ? buPROPion ER (WELLBUTRIN SR) 100 MG 12 hr tablet  ?  Sig: Take 1 tablet (100 mg total) by mouth in the morning.  ?  Dispense:  30 tablet  ?  Refill:  0  ?  30 d supply;  ** OV for RF **   Do not send RF request  ? fluticasone (FLONASE) 50 MCG/ACT nasal spray  ?  Sig: 1 spray each nostril following sinus rinses twice daily  ?  Dispense:  16 g  ?  Refill:  0  ? levocetirizine (XYZAL) 5 MG tablet  ?  Sig: Take 1 tablet (5 mg total) by mouth every evening.  ?  Dispense:  30 tablet  ?  Refill:  0  ?  ? ?1. Benign essential hypertension ?Blood pressure is stable, no change in medications, and continue weight loss and continue PNP, decrease salt, and increase exercise.  ? ?2. Stress reaction with emotional eating ?We will refill Wellbutrin SR for 1 month. Increase exercise. Behavior modification techniques were discussed today to help Mary Odonnell deal with her emotional/non-hunger eating behaviors.  Orders and follow up as documented in patient record.  ? ?- buPROPion ER (WELLBUTRIN SR) 100 MG 12 hr tablet; Take 1 tablet (100 mg total) by mouth in the morning.  Dispense: 30 tablet; Refill: 0 ? ?3. Environmental and seasonal allergies ?We will refill Flonase and Xyzal for 1 month. ? ?- Seasonal and  environmental allergies and pathophysiology of disease process discussed with patient.  ?  ?- Preventative strategies as first line for management discussed.  I encouraged use of KN or N-95 mask use as well as sterile saline rinses such as Milta Deiters Med or AYR sinus rinses to be done twice daily and after any prolonged exposure to the environment or allergen.    It is best to use distilled water or previously boiled water    ? ?- If eyes are itchy or irritated feeling when your seasonal allergies get bad, ok to use Naphcon-A over-the-counter eyedrops as needed ? ?- If continues to worsen despite preventative  strategies, take over-the-counter meds such as Allegra, Xyzal etc daily during allergy seasons or nasal steroids such as Flonase, Rhinocort and the like ? ?- We can consider Astelin nasal spray, if symptoms are not well controlled with OTC medications, nasal rinses and preventative strategies. ? ?- Encouraged to return to clinic or call the office today discussed further questions or concerns they may have. ? ?- fluticasone (FLONASE) 50 MCG/ACT nasal spray; 1 spray each nostril following sinus rinses twice daily  Dispense: 16 g; Refill: 0 ?- levocetirizine (XYZAL) 5 MG tablet; Take 1 tablet (5 mg total) by mouth every evening.  Dispense: 30 tablet; Refill: 0 ? ?4. At risk for dehydration ?Mary Odonnell was given approximately 15 minutes of dehydration prevention counseling today. Mary Odonnell is at risk for dehydration due to weight loss and current medication(s). She was encouraged to hydrate and monitor fluid status to avoid dehydration as weight loss plateaus. ? ?5. Obesity with current BMI of 44.1 ?Mary Odonnell is currently in the action stage of change. As such, her goal is to continue with weight loss efforts. She has agreed to the Category 1 Plan and keeping a food journal and adhering to recommended goals of 300-450 calories and 40+ grams of protein at supper daily.  ? ?Focus on eating on the plan and calculating snack and off the plan eating (ie, bananas). ? ?Exercise goals: For substantial health benefits, adults should do at least 150 minutes (2 hours and 30 minutes) a week of moderate-intensity, or 75 minutes (1 hour and 15 minutes) a week of vigorous-intensity aerobic physical activity, or an equivalent combination of moderate- and vigorous-intensity aerobic activity. Aerobic activity should be performed in episodes of at least 10 minutes, and preferably, it should be spread throughout the week. ? ?Behavioral modification strategies: avoiding temptations. ? ?Mary Odonnell has agreed to follow-up with our clinic in 3 weeks. She  was informed of the importance of frequent follow-up visits to maximize her success with intensive lifestyle modifications for her multiple health conditions.  ? ?Objective:  ? ?Blood pressure 138/83, pulse 88, temperature 98.1 ?F (36.7 ?C), height '4\' 10"'$  (1.473 m), weight 210 lb (95.3 kg), last menstrual period 01/24/2016, SpO2 99 %. ?Body mass index is 43.89 kg/m?. ? ?General: Cooperative, alert, well developed, in no acute distress. ?HEENT: Conjunctivae and lids unremarkable. ?Cardiovascular: Regular rhythm.  ?Lungs: Normal work of breathing. ?Neurologic: No focal deficits.  ? ?Lab Results  ?Component Value Date  ? CREATININE 0.89 03/05/2021  ? BUN 12 03/05/2021  ? NA 141 03/05/2021  ? K 4.3 03/05/2021  ? CL 101 03/05/2021  ? CO2 24 03/05/2021  ? ?Lab Results  ?Component Value Date  ? ALT 13 03/05/2021  ? AST 15 03/05/2021  ? ALKPHOS 74 03/05/2021  ? BILITOT 0.6 03/05/2021  ? ?Lab Results  ?Component Value Date  ? HGBA1C 6.0 (H) 03/05/2021  ?  HGBA1C 6.0 09/29/2020  ? HGBA1C 6.3 (H) 06/12/2020  ? HGBA1C 6.1 (H) 08/21/2018  ? HGBA1C 6.1 (H) 04/11/2018  ? ?Lab Results  ?Component Value Date  ? INSULIN 13.9 03/05/2021  ? INSULIN 13.2 06/12/2020  ? ?Lab Results  ?Component Value Date  ? TSH 1.10 09/29/2020  ? ?Lab Results  ?Component Value Date  ? CHOL 161 09/29/2020  ? HDL 60 09/29/2020  ? [place] 88 09/29/2020  ? TRIG 64 09/29/2020  ? CHOLHDL 2.8 06/12/2020  ? ?Lab Results  ?Component Value Date  ? VD25OH 47.9 03/05/2021  ? VD25OH 47.8 10/16/2020  ? VD25OH 25.4 (L) 06/12/2020  ? ?Lab Results  ?Component Value Date  ? WBC 4.5 09/29/2020  ? HGB 13.5 09/29/2020  ? HCT 42 09/29/2020  ? MCV 84 06/12/2020  ? PLT 279 09/29/2020  ? ?No results found for: IRON, TIBC, FERRITIN ? ?Attestation Statements:  ? ?Reviewed by clinician on day of visit: allergies, medications, problem list, medical history, surgical history, family history, social history, and previous encounter notes. ? ? ?I, Trixie Dredge, am acting as  transcriptionist for [employer], DO. ? ?I have reviewed the above documentation for accuracy and completeness, and I agree with the above. Marjory Sneddon, D.O. ? ?The 21st Century Cures Act was signed into law

## 2021-05-28 ENCOUNTER — Ambulatory Visit (INDEPENDENT_AMBULATORY_CARE_PROVIDER_SITE_OTHER): Payer: Managed Care, Other (non HMO) | Admitting: Family Medicine

## 2021-05-28 ENCOUNTER — Encounter (INDEPENDENT_AMBULATORY_CARE_PROVIDER_SITE_OTHER): Payer: Self-pay | Admitting: Family Medicine

## 2021-05-28 VITALS — BP 113/80 | HR 83 | Temp 97.8°F | Ht <= 58 in | Wt 210.0 lb

## 2021-05-28 DIAGNOSIS — F430 Acute stress reaction: Secondary | ICD-10-CM

## 2021-05-28 DIAGNOSIS — R7303 Prediabetes: Secondary | ICD-10-CM

## 2021-05-28 DIAGNOSIS — Z6841 Body Mass Index (BMI) 40.0 and over, adult: Secondary | ICD-10-CM

## 2021-05-28 DIAGNOSIS — J3089 Other allergic rhinitis: Secondary | ICD-10-CM

## 2021-05-28 DIAGNOSIS — E559 Vitamin D deficiency, unspecified: Secondary | ICD-10-CM | POA: Diagnosis not present

## 2021-05-28 DIAGNOSIS — E669 Obesity, unspecified: Secondary | ICD-10-CM

## 2021-05-28 DIAGNOSIS — Z9189 Other specified personal risk factors, not elsewhere classified: Secondary | ICD-10-CM

## 2021-05-28 MED ORDER — FLUTICASONE PROPIONATE 50 MCG/ACT NA SUSP: NASAL | 0 refills | Status: DC

## 2021-05-28 MED ORDER — VITAMIN D (ERGOCALCIFEROL) 1.25 MG (50000 UNIT) PO CAPS: 50000.0000 [IU] | ORAL_CAPSULE | ORAL | 0 refills | Status: DC

## 2021-05-28 MED ORDER — BUPROPION HCL ER (SR) 100 MG PO TB12: 100.0000 mg | ORAL_TABLET | Freq: Every morning | ORAL | 0 refills | Status: DC

## 2021-05-28 MED ORDER — LEVOCETIRIZINE DIHYDROCHLORIDE 5 MG PO TABS: 5.0000 mg | ORAL_TABLET | Freq: Every evening | ORAL | 0 refills | Status: AC

## 2021-06-05 ENCOUNTER — Other Ambulatory Visit (INDEPENDENT_AMBULATORY_CARE_PROVIDER_SITE_OTHER): Payer: Self-pay | Admitting: Family Medicine

## 2021-06-05 DIAGNOSIS — J3089 Other allergic rhinitis: Secondary | ICD-10-CM

## 2021-06-08 NOTE — Progress Notes (Unsigned)
Chief Complaint:   OBESITY Mary Odonnell is here to discuss her progress with her obesity treatment plan along with follow-up of her obesity related diagnoses. Mary Odonnell is on the Category 1 Plan and states she is following her eating plan approximately 50% of the time. Mary Odonnell states she is walking 15 minutes 4 times per week.  Today's visit was #: 20  Starting weight: 225 lbs Starting date: 06/12/2020 Today's weight: 210 lbs Today's date: 05/28/2021 Total lbs lost to date: 15 lbs Total lbs lost since last in-office visit: 0 lbs  Interim History: Mary Odonnell stated that she did not stay on the plan very well, but did increase exercise for stress management by power walking on breaks at work. She is focusing on better self care activities.   Subjective:   1. Environmental and seasonal allergies Mary Odonnell has been walking outside and sometimes flairs with symptoms if grass is being cut while she is walking. She does try to avoid walking during those times.  2. Vitamin D deficiency She is currently taking prescription vitamin D 50,000 IU each week. She denies nausea, vomiting or muscle weakness.  3. Pre-diabetes Mary Odonnell was asked to call her insurance to inquire about Ozempic or equivalent medications. She said her insurance covers Ozempic, but she wishes to wait at this time though.  4. Stress reaction with emotional eating Mood is stable and feeling well lately. No issues.   5. At risk for malnutrition Mary Odonnell is at risk for nausea due to current eating habits.  Assessment/Plan:  No orders of the defined types were placed in this encounter.   Medications Discontinued During This Encounter  Medication Reason   Vitamin D, Ergocalciferol, (DRISDOL) 1.25 MG (50000 UNIT) CAPS capsule Reorder   buPROPion ER (WELLBUTRIN SR) 100 MG 12 hr tablet Reorder   fluticasone (FLONASE) 50 MCG/ACT nasal spray Reorder   levocetirizine (XYZAL) 5 MG tablet Reorder     Meds ordered this encounter  Medications    buPROPion ER (WELLBUTRIN SR) 100 MG 12 hr tablet    Sig: Take 1 tablet (100 mg total) by mouth in the morning.    Dispense:  30 tablet    Refill:  0    30 d supply;  ** OV for RF **   Do not send RF request   Vitamin D, Ergocalciferol, (DRISDOL) 1.25 MG (50000 UNIT) CAPS capsule    Sig: Take 1 capsule (50,000 Units total) by mouth every 7 (seven) days.    Dispense:  4 capsule    Refill:  0    30 d supply; OV for RF   fluticasone (FLONASE) 50 MCG/ACT nasal spray    Sig: 1 spray each nostril following sinus rinses twice daily    Dispense:  16 g    Refill:  0   levocetirizine (XYZAL) 5 MG tablet    Sig: Take 1 tablet (5 mg total) by mouth every evening.    Dispense:  30 tablet    Refill:  0     1. Environmental and seasonal allergies Mary Odonnell will work at avoiding triggers. We will refill Xyzal and Flonase.  - fluticasone (FLONASE) 50 MCG/ACT nasal spray; 1 spray each nostril following sinus rinses twice daily  Dispense: 16 g; Refill: 0 - levocetirizine (XYZAL) 5 MG tablet; Take 1 tablet (5 mg total) by mouth every evening.  Dispense: 30 tablet; Refill: 0  2. Vitamin D deficiency Low Vitamin D level contributes to fatigue and are associated with obesity, breast, and colon cancer.  She agrees to continue to take prescription Vitamin D '@50'$ ,000 IU every week and will follow-up for routine testing of Vitamin D, at least 2-3 times per year to avoid over-replacement.  - Vitamin D, Ergocalciferol, (DRISDOL) 1.25 MG (50000 UNIT) CAPS capsule; Take 1 capsule (50,000 Units total) by mouth every 7 (seven) days.  Dispense: 4 capsule; Refill: 0  3. Pre-diabetes Mary Odonnell will continue to work on weight loss, exercise, and decreasing simple carbohydrates to help decrease the risk of diabetes.   4. Stress reaction with emotional eating We will refill Wellbutrin.  - buPROPion ER (WELLBUTRIN SR) 100 MG 12 hr tablet; Take 1 tablet (100 mg total) by mouth in the morning.  Dispense: 30 tablet; Refill:  0  5. At risk for malnutrition Mary Odonnell was given approximately 9 minutes of counseling today regarding prevention of malnutrition and ways to meet macronutrient goals..    6. Obesity, Current BMI 44.0 Mary Odonnell is currently in the action stage of change. As such, her goal is to continue with weight loss efforts. She has agreed to keeping a food journal and adhering to recommended goals of [id number] calories and 100+ grams of  protein daily.  Mary Odonnell will repeat her IC test at next office visit, and was told to fast for this, arriving 30 minutes prior to appointment time, She will also bring in her journal log!  Exercise goals: 150 minutes of exercise weekly.  Behavioral modification strategies: increasing lean protein intake, decreasing simple carbohydrates, and keeping a strict food journal.  Mary Odonnell has agreed to follow-up with our clinic in 3 weeks. She was informed of the importance of frequent follow-up visits to maximize her success with intensive lifestyle modifications for her multiple health conditions.   Objective:   Blood pressure 113/80, pulse 83, temperature 97.8 F (36.6 C), height '4\' 10"'$  (1.473 m), weight 210 lb (95.3 kg), last menstrual period 01/24/2016, SpO2 97 %. Body mass index is 43.89 kg/m.  General: Cooperative, alert, well developed, in no acute distress. HEENT: Conjunctivae and lids unremarkable. Cardiovascular: Regular rhythm.  Lungs: Normal work of breathing. Neurologic: No focal deficits.   Lab Results  Component Value Date   CREATININE 0.89 03/05/2021   BUN 12 03/05/2021   NA 141 03/05/2021   K 4.3 03/05/2021   CL 101 03/05/2021   CO2 24 03/05/2021   Lab Results  Component Value Date   ALT 13 03/05/2021   AST 15 03/05/2021   ALKPHOS 74 03/05/2021   BILITOT 0.6 03/05/2021   Lab Results  Component Value Date   HGBA1C 6.0 (H) 03/05/2021   HGBA1C 6.0 09/29/2020   HGBA1C 6.3 (H) 06/12/2020   HGBA1C 6.1 (H) 08/21/2018   HGBA1C 6.1 (H) 04/11/2018    Lab Results  Component Value Date   INSULIN 13.9 03/05/2021   INSULIN 13.2 06/12/2020   Lab Results  Component Value Date   TSH 1.10 09/29/2020   Lab Results  Component Value Date   CHOL 161 09/29/2020   HDL 60 09/29/2020   LDLCALC 88 09/29/2020   TRIG 64 09/29/2020   CHOLHDL 2.8 06/12/2020   Lab Results  Component Value Date   VD25OH 47.9 03/05/2021   VD25OH 47.8 10/16/2020   VD25OH 25.4 (L) 06/12/2020   Lab Results  Component Value Date   WBC 4.5 09/29/2020   HGB 13.5 09/29/2020   HCT 42 09/29/2020   MCV 84 06/12/2020   PLT 279 09/29/2020   No results found for: IRON, TIBC, FERRITIN  Attestation Statements:   Reviewed  by clinician on day of visit: allergies, medications, problem list, medical history, surgical history, family history, social history, and previous encounter notes.  ILennette Bihari, CMA, am acting as transcriptionist for Dr. Raliegh Scarlet, DO.  I have reviewed the above documentation for accuracy and completeness, and I agree with the above. Marjory Sneddon, D.O.  The [place] was signed into law in 2016 which includes the topic of electronic health records.  This provides immediate access to information in MyChart.  This includes consultation notes, operative notes, office notes, lab results and pathology reports.  If you have any questions about what you read please let us know at your next visit so we can discuss your concerns and take corrective action if need be.  We are right here with you.

## 2021-06-17 ENCOUNTER — Other Ambulatory Visit (INDEPENDENT_AMBULATORY_CARE_PROVIDER_SITE_OTHER): Payer: Self-pay | Admitting: Family Medicine

## 2021-06-17 DIAGNOSIS — E559 Vitamin D deficiency, unspecified: Secondary | ICD-10-CM

## 2021-06-18 ENCOUNTER — Ambulatory Visit (INDEPENDENT_AMBULATORY_CARE_PROVIDER_SITE_OTHER): Payer: Managed Care, Other (non HMO) | Admitting: Family Medicine

## 2021-06-18 ENCOUNTER — Encounter (INDEPENDENT_AMBULATORY_CARE_PROVIDER_SITE_OTHER): Payer: Self-pay | Admitting: Family Medicine

## 2021-06-18 VITALS — BP 120/86 | HR 75 | Temp 98.1°F | Ht <= 58 in | Wt 209.0 lb

## 2021-06-18 DIAGNOSIS — E669 Obesity, unspecified: Secondary | ICD-10-CM

## 2021-06-18 DIAGNOSIS — F430 Acute stress reaction: Secondary | ICD-10-CM | POA: Diagnosis not present

## 2021-06-18 DIAGNOSIS — K760 Fatty (change of) liver, not elsewhere classified: Secondary | ICD-10-CM

## 2021-06-18 DIAGNOSIS — Z6841 Body Mass Index (BMI) 40.0 and over, adult: Secondary | ICD-10-CM

## 2021-06-18 DIAGNOSIS — E559 Vitamin D deficiency, unspecified: Secondary | ICD-10-CM

## 2021-06-18 DIAGNOSIS — Z9189 Other specified personal risk factors, not elsewhere classified: Secondary | ICD-10-CM

## 2021-06-18 DIAGNOSIS — R0602 Shortness of breath: Secondary | ICD-10-CM | POA: Diagnosis not present

## 2021-06-18 DIAGNOSIS — Z7984 Long term (current) use of oral hypoglycemic drugs: Secondary | ICD-10-CM

## 2021-06-18 DIAGNOSIS — R7303 Prediabetes: Secondary | ICD-10-CM

## 2021-06-18 MED ORDER — VITAMIN D (ERGOCALCIFEROL) 1.25 MG (50000 UNIT) PO CAPS: 50000.0000 [IU] | ORAL_CAPSULE | ORAL | 0 refills | Status: DC

## 2021-06-18 MED ORDER — BUPROPION HCL ER (SR) 100 MG PO TB12: 100.0000 mg | ORAL_TABLET | Freq: Every morning | ORAL | 0 refills | Status: DC

## 2021-06-19 LAB — VITAMIN B12: Vitamin B-12: 680 pg/mL (ref 232–1245)

## 2021-06-19 LAB — COMPREHENSIVE METABOLIC PANEL
ALT: 15 IU/L (ref 0–32)
AST: 18 IU/L (ref 0–40)
Albumin/Globulin Ratio: 1.6 (ref 1.2–2.2)
Albumin: 4.3 g/dL (ref 3.8–4.9)
Alkaline Phosphatase: 71 IU/L (ref 44–121)
BUN/Creatinine Ratio: 11 (ref 9–23)
BUN: 10 mg/dL (ref 6–24)
Bilirubin Total: 0.6 mg/dL (ref 0.0–1.2)
CO2: 24 mmol/L (ref 20–29)
Calcium: 9.1 mg/dL (ref 8.7–10.2)
Chloride: 104 mmol/L (ref 96–106)
Creatinine, Ser: 0.9 mg/dL (ref 0.57–1.00)
Globulin, Total: 2.7 g/dL (ref 1.5–4.5)
Glucose: 101 mg/dL — ABNORMAL HIGH (ref 70–99)
Potassium: 4.2 mmol/L (ref 3.5–5.2)
Sodium: 142 mmol/L (ref 134–144)
Total Protein: 7 g/dL (ref 6.0–8.5)
eGFR: 76 mL/min/{1.73_m2} (ref >59)

## 2021-06-19 LAB — VITAMIN D 25 HYDROXY (VIT D DEFICIENCY, FRACTURES): Vit D, 25-Hydroxy: 54.7 ng/mL (ref 30.0–100.0)

## 2021-06-19 LAB — HEMOGLOBIN A1C
Est. average glucose Bld gHb Est-mCnc: 137 mg/dL
Hgb A1c MFr Bld: 6.4 % — ABNORMAL HIGH (ref 4.8–5.6)

## 2021-06-19 LAB — LIPID PANEL
Chol/HDL Ratio: 2.4 ratio (ref 0.0–4.4)
Cholesterol, Total: 154 mg/dL (ref 100–199)
HDL: 63 mg/dL (ref >39)
LDL Chol Calc (NIH): 77 mg/dL (ref 0–99)
Triglycerides: 75 mg/dL (ref 0–149)
VLDL Cholesterol Cal: 14 mg/dL (ref 5–40)

## 2021-06-19 LAB — TSH: TSH: 1.3 u[IU]/mL (ref 0.450–4.500)

## 2021-06-19 LAB — INSULIN, RANDOM: INSULIN: 16.5 u[IU]/mL (ref 2.6–24.9)

## 2021-06-19 LAB — MAGNESIUM: Magnesium: 2.1 mg/dL (ref 1.6–2.3)

## 2021-06-19 LAB — T4, FREE: Free T4: 1.34 ng/dL (ref 0.82–1.77)

## 2021-06-27 DIAGNOSIS — R0602 Shortness of breath: Secondary | ICD-10-CM | POA: Insufficient documentation

## 2021-06-27 NOTE — Progress Notes (Unsigned)
Chief Complaint:   OBESITY Mary Odonnell is here to discuss her progress with her obesity treatment plan along with follow-up of her obesity related diagnoses. Mary Odonnell is on keeping a food journal and adhering to recommended goals of [phone] calories and 100+ protein daily and states she is following her eating plan approximately 55% of the time. Mary Odonnell states she is walking 25 minutes 3 times per week.  Today's visit was #: 21 Starting weight: 225 lbs Starting date: 0602/2022 Today's weight: 209 lbs Today's date: 06/18/2021 Total lbs lost to date: 16 lbs Total lbs lost since last in-office visit: 1 lb  Interim History: Mary Odonnell changed to journaling at last office visit.  She struggled wit hitting her protein goals, she averaged about 40 grams per day.   Subjective:   1. SOB (shortness of breath) on exertion ***  2. Vitamin D deficiency She is currently taking prescription vitamin D 50,000 IU each week. She denies nausea, vomiting or muscle weakness.  3. Stress reaction with emotional eating Mary Odonnell has been feeling well and stable, there is no need for change in dosage of medication.   4. NAFLD (nonalcoholic fatty liver disease) Mary Odonnell has a dx of elevated ALT. Her BMI is over 40. She denies abdominal pain or jaundice and has never been told of any liver problems in the past. She denies excessive alcohol intake.  5. Pre-diabetes Mary Odonnell has a diagnosis of prediabetes based on her elevated HgA1c and was informed this puts her at greater risk of developing diabetes. She continues to work on diet and exercise to decrease her risk of diabetes. She denies nausea or hypoglycemia.  6. At risk for diabetes mellitus Mary Odonnell is at higher than average risk for developing diabetes due to her obesity.    Assessment/Plan:   1. SOB (shortness of breath) on exertion Recheck IC, needs for change in meal plan  2. Vitamin D deficiency Low Vitamin D level contributes to fatigue and are associated  with obesity, breast, and colon cancer. She agrees to continue to take prescription Vitamin D '@50'$ ,000 IU every week and will follow-up for routine testing of Vitamin D, at least 2-3 times per year to avoid over-replacement.   Refill - Vitamin D, Ergocalciferol, (DRISDOL) 1.25 MG (50000 UNIT) CAPS capsule; Take 1 capsule (50,000 Units total) by mouth every 7 (seven) days.  Dispense: 4 capsule; Refill: 0 - VITAMIN D 25 Hydroxy (Vit-D Deficiency, Fractures)  3. Stress reaction with emotional eating Refill Buproprion 100 mg #90 day supply due to insurance.  See below.  Continue prudent nutritional plan and increase exercise.   Refill- buPROPion ER (WELLBUTRIN SR) 100 MG 12 hr tablet; Take 1 tablet (100 mg total) by mouth in the morning.  Dispense: 90 tablet; Refill: 0 Check labs today, see below.  - TSH - T4, free  4. NAFLD (nonalcoholic fatty liver disease) We discussed the likely diagnosis of non-alcoholic fatty liver disease today and how this condition is obesity related. Mary Odonnell was educated the importance of weight loss. Mary Odonnell agreed to continue with her weight loss efforts with healthier diet and exercise as an essential part of her treatment plan.  Check labs today, see below.  - Lipid panel - Comprehensive metabolic panel  5. Pre-diabetes Mary Odonnell will continue to work on weight loss, exercise, and decreasing simple carbohydrates to help decrease the risk of diabetes.  Continue Metformin. Check labs today, due to Metformin.   See below.    - Vitamin B12 - Magnesium - Insulin, random -  Hemoglobin A1c  6. At risk for diabetes mellitus Mary Odonnell was given approximately 23 minutes of diabetic education and counseling today. We discussed intensive lifestyle modifications today with an emphasis on weight loss as well as increasing exercise and decreasing simple carbohydrates in her diet. We also reviewed medication options with an emphasis on risk versus benefits of those  discussed.  Repetitive spaced learning was employed today to elicit superior memory formation and behavioral change.   7. Obesity, Current BMI 43.7 Repeat IC went from 1259 to now REE 1469.   Increased REE likely due to better nutrition and muscle mass.  No change in journaling , but try to hit protein goals.   Mary Odonnell is currently in the action stage of change. As such, her goal is to continue with weight loss efforts. She has agreed to keeping a food journal and adhering to recommended goals of 1000 calories and 100+ protein daily.   Exercise goals: For substantial health benefits, adults should do at least 150 minutes (2 hours and 30 minutes) a week of moderate-intensity, or 75 minutes (1 hour and 15 minutes) a week of vigorous-intensity aerobic physical activity, or an equivalent combination of moderate- and vigorous-intensity aerobic activity. Aerobic activity should be performed in episodes of at least 10 minutes, and preferably, it should be spread throughout the week.  Behavioral modification strategies: increasing lean protein intake, decreasing simple carbohydrates, and keeping a strict food journal.  Mary Odonnell has agreed to follow-up with our clinic in 3 weeks. She was informed of the importance of frequent follow-up visits to maximize her success with intensive lifestyle modifications for her multiple health conditions.   Mary Odonnell was informed we would discuss her lab results at her next visit unless there is a critical issue that needs to be addressed sooner. Mary Odonnell agreed to keep her next visit at the agreed upon time to discuss these results.  Objective:   Blood pressure 120/86, pulse 75, temperature 98.1 F (36.7 C), height '4\' 10"'$  (1.473 m), weight 209 lb (94.8 kg), last menstrual period 01/24/2016, SpO2 98 %. Body mass index is 43.68 kg/m.  General: Cooperative, alert, well developed, in no acute distress. HEENT: Conjunctivae and lids unremarkable. Cardiovascular: Regular  rhythm.  Lungs: Normal work of breathing. Neurologic: No focal deficits.   Lab Results  Component Value Date   CREATININE 0.90 06/18/2021   BUN 10 06/18/2021   NA 142 06/18/2021   K 4.2 06/18/2021   CL 104 06/18/2021   CO2 24 06/18/2021   Lab Results  Component Value Date   ALT 15 06/18/2021   AST 18 06/18/2021   ALKPHOS 71 06/18/2021   BILITOT 0.6 06/18/2021   Lab Results  Component Value Date   HGBA1C 6.4 (H) 06/18/2021   HGBA1C 6.0 (H) 03/05/2021   HGBA1C 6.0 09/29/2020   HGBA1C 6.3 (H) 06/12/2020   HGBA1C 6.1 (H) 08/21/2018   Lab Results  Component Value Date   INSULIN 16.5 06/18/2021   INSULIN 13.9 03/05/2021   INSULIN 13.2 06/12/2020   Lab Results  Component Value Date   TSH 1.300 06/18/2021   Lab Results  Component Value Date   CHOL 154 06/18/2021   HDL 63 06/18/2021   LDLCALC 77 06/18/2021   TRIG 75 06/18/2021   CHOLHDL 2.4 06/18/2021   Lab Results  Component Value Date   VD25OH 54.7 06/18/2021   VD25OH 47.9 03/05/2021   VD25OH 47.8 10/16/2020   Lab Results  Component Value Date   WBC 4.5 09/29/2020   HGB  13.5 09/29/2020   HCT 42 09/29/2020   MCV 84 06/12/2020   PLT 279 09/29/2020   No results found for: "IRON", "TIBC", "FERRITIN"  Attestation Statements:   Reviewed by clinician on day of visit: allergies, medications, problem list, medical history, surgical history, family history, social history, and previous encounter notes.  I, Davy Pique, RMA, am acting as Location manager for [employer], DO.  I have reviewed the above documentation for accuracy and completeness, and I agree with the above. -  ***

## 2021-07-09 ENCOUNTER — Encounter (INDEPENDENT_AMBULATORY_CARE_PROVIDER_SITE_OTHER): Payer: Self-pay | Admitting: Family Medicine

## 2021-07-09 ENCOUNTER — Ambulatory Visit (INDEPENDENT_AMBULATORY_CARE_PROVIDER_SITE_OTHER): Payer: Managed Care, Other (non HMO) | Admitting: Family Medicine

## 2021-07-09 VITALS — BP 127/86 | HR 82 | Temp 97.9°F | Ht <= 58 in | Wt 207.0 lb

## 2021-07-09 DIAGNOSIS — I10 Essential (primary) hypertension: Secondary | ICD-10-CM

## 2021-07-09 DIAGNOSIS — E559 Vitamin D deficiency, unspecified: Secondary | ICD-10-CM

## 2021-07-09 DIAGNOSIS — R7303 Prediabetes: Secondary | ICD-10-CM | POA: Diagnosis not present

## 2021-07-09 DIAGNOSIS — F430 Acute stress reaction: Secondary | ICD-10-CM | POA: Diagnosis not present

## 2021-07-09 DIAGNOSIS — E669 Obesity, unspecified: Secondary | ICD-10-CM

## 2021-07-09 DIAGNOSIS — Z6841 Body Mass Index (BMI) 40.0 and over, adult: Secondary | ICD-10-CM

## 2021-07-09 MED ORDER — VITAMIN D (ERGOCALCIFEROL) 1.25 MG (50000 UNIT) PO CAPS: 50000.0000 [IU] | ORAL_CAPSULE | ORAL | 0 refills | Status: DC

## 2021-07-09 MED ORDER — SEMAGLUTIDE-WEIGHT MANAGEMENT 0.25 MG/0.5ML ~~LOC~~ SOAJ: 0.2500 mg | SUBCUTANEOUS | 0 refills | Status: DC

## 2021-07-11 ENCOUNTER — Other Ambulatory Visit (INDEPENDENT_AMBULATORY_CARE_PROVIDER_SITE_OTHER): Payer: Self-pay | Admitting: Family Medicine

## 2021-07-11 DIAGNOSIS — J3089 Other allergic rhinitis: Secondary | ICD-10-CM

## 2021-07-13 NOTE — Progress Notes (Signed)
Chief Complaint:   OBESITY Mary Odonnell is here to discuss her progress with her obesity treatment plan along with follow-up of her obesity related diagnoses. Mary Odonnell is on keeping a food journal and adhering to recommended goals of 1000 calories and 100 protein and states she is following her eating plan approximately 75% of the time. Mary Odonnell states she is walking 20 minutes 3-4 times per week.  Today's visit was #: 22 Starting weight: 225 lbs Starting date: 07/09/2021 Today's weight: 207 lbs Today's date: 07/09/2021 Total lbs lost to date: 18 lbs Total lbs lost since last in-office visit: 2 lbs  Interim History: Mary Odonnell has journaled 1/3 of the time, but 1/2 of the time in the past couple of weeks.  Eating 1450 calories and 64 grams of protein.1984 cal with 74 grams, 860 calories with 61 grams etc.  Here also to review labs obtained at last visit.  A1c, Fasting insulin, B12, Vitamin D, TSH, FT4, FLP, Magnesium.  Subjective:   1. Pre-diabetes Worsening.  Discussed labs with patient today. [name] has a diagnosis of prediabetes based on her elevated HgA1c and was informed this puts her at greater risk of developing diabetes. She continues to work on diet and exercise to decrease her risk of diabetes. She denies nausea or hypoglycemia. A1c is 6.4, it is the worst it has ever been.  She is taking Metformin.  2. Essential hypertension Discussed labs with patient today. Review: taking medications as instructed, no medication side effects noted, no chest pain on exertion, no dyspnea on exertion, no swelling of ankles. She is taking Norvasc, Cozaar, and lasix.   3. Vitamin D deficiency Discussed labs with patient today. She is currently taking prescription vitamin D 50,000 IU each week. She denies nausea, vomiting or muscle weakness.  4. Stress reaction with emotional eating Zykeriah is struggling with emotional eating and using food for comfort to the extent that it is negatively impacting her  health. She has been working on behavior modification techniques to help reduce her emotional eating and has been successful. She shows no signs of suicidal or homicidal ideations.  Emotional eating is under good control.  She is taking Wellbutrin.    Assessment/Plan:  No orders of the defined types were placed in this encounter.   Medications Discontinued During This Encounter  Medication Reason   Vitamin D, Ergocalciferol, (DRISDOL) 1.25 MG (50000 UNIT) CAPS capsule Reorder     Meds ordered this encounter  Medications   Vitamin D, Ergocalciferol, (DRISDOL) 1.25 MG (50000 UNIT) CAPS capsule    Sig: Take 1 capsule (50,000 Units total) by mouth every 7 (seven) days.    Dispense:  4 capsule    Refill:  0    30 d supply; OV for RF   Semaglutide-Weight Management 0.25 MG/0.5ML SOAJ    Sig: Inject 0.25 mg into the skin once a week. Every thursday    Dispense:  2 mL    Refill:  0     1. Pre-diabetes Mary Odonnell will continue to work on weight loss, exercise, and decreasing simple carbohydrates to help decrease the risk of diabetes.  Continue Metformin 750 mg XR per PCP.  She is still eating too many carbohydrates and calories and not enough proteins.  Extensive counseling done today.  2. Essential hypertension Mary Odonnell is working on healthy weight loss and exercise to improve blood pressure control. We will watch for signs of hypotension as she continues her lifestyle modifications. Mary Odonnell's blood pressure is at goal and CMP  is within normal limits.   3. Vitamin D deficiency Low Vitamin D level contributes to fatigue and are associated with obesity, breast, and colon cancer. She agrees to continue to take prescription Vitamin D '@50'$ ,000 IU every week and will follow-up for routine testing of Vitamin D, at least 2-3 times per year to avoid over-replacement. Continue Vitamin D, it is at goal.  Refill - Vitamin D, Ergocalciferol, (DRISDOL) 1.25 MG (50000 UNIT) CAPS capsule; Take 1 capsule (50,000  Units total) by mouth every 7 (seven) days.  Dispense: 4 capsule; Refill: 0  4. Stress reaction with emotional eating Behavior modification techniques were discussed today to help Mary Odonnell deal with her emotional/non-hunger eating behaviors.  Orders and follow up as documented in patient record. Continue Wellbutrin at same dose, she has been stable. Continue to walk on breaks for stress management.     5. Obesity, Current BMI 43.3 Pamlea's goal:  Journal what she eats and consumes every day.  Start Wegovy weekly, every Thursday.    Start - Semaglutide-Weight Management 0.25 MG/0.5ML SOAJ; Inject 0.25 mg into the skin once a week. Every thursday  Dispense: 2 mL; Refill: 0  Mary Odonnell is currently in the action stage of change. As such, her goal is to continue with weight loss efforts. She has agreed to keeping a food journal and adhering to recommended goals of 1000 calories and 100+ protein daily.   Exercise goals:  As is.  Behavioral modification strategies: increasing lean protein intake, decreasing simple carbohydrates, and planning for success.  Mary Odonnell has agreed to follow-up with our clinic in 3 weeks. She was informed of the importance of frequent follow-up visits to maximize her success with intensive lifestyle modifications for her multiple health conditions.   Objective:   Blood pressure 127/86, pulse 82, temperature 97.9 F (36.6 C), height '4\' 10"'$  (1.473 m), weight 207 lb (93.9 kg), last menstrual period 01/24/2016, SpO2 99 %. Body mass index is 43.26 kg/m.  General: Cooperative, alert, well developed, in no acute distress. HEENT: Conjunctivae and lids unremarkable. Cardiovascular: Regular rhythm.  Lungs: Normal work of breathing. Neurologic: No focal deficits.   Lab Results  Component Value Date   CREATININE 0.90 06/18/2021   BUN 10 06/18/2021   NA 142 06/18/2021   K 4.2 06/18/2021   CL 104 06/18/2021   CO2 24 06/18/2021   Lab Results  Component Value Date   ALT 15  06/18/2021   AST 18 06/18/2021   ALKPHOS 71 06/18/2021   BILITOT 0.6 06/18/2021   Lab Results  Component Value Date   HGBA1C 6.4 (H) 06/18/2021   HGBA1C 6.0 (H) 03/05/2021   HGBA1C 6.0 09/29/2020   HGBA1C 6.3 (H) 06/12/2020   HGBA1C 6.1 (H) 08/21/2018   Lab Results  Component Value Date   INSULIN 16.5 06/18/2021   INSULIN 13.9 03/05/2021   INSULIN 13.2 06/12/2020   Lab Results  Component Value Date   TSH 1.300 06/18/2021   Lab Results  Component Value Date   CHOL 154 06/18/2021   HDL 63 06/18/2021   LDLCALC 77 06/18/2021   TRIG 75 06/18/2021   CHOLHDL 2.4 06/18/2021   Lab Results  Component Value Date   VD25OH 54.7 06/18/2021   VD25OH 47.9 03/05/2021   VD25OH 47.8 10/16/2020   Lab Results  Component Value Date   WBC 4.5 09/29/2020   HGB 13.5 09/29/2020   HCT 42 09/29/2020   MCV 84 06/12/2020   PLT 279 09/29/2020   No results found for: "IRON", "TIBC", "FERRITIN"  Attestation Statements:   Reviewed by clinician on day of visit: allergies, medications, problem list, medical history, surgical history, family history, social history, and previous encounter notes.  Time spent on visit including pre-visit chart review and post-visit care and charting was 45 minutes.   I, Davy Pique, RMA, am acting as Location manager for [employer], DO.   I have reviewed the above documentation for accuracy and completeness, and I agree with the above. Marjory Sneddon, D.O.  The [employer] was signed into law in 2016 which includes the topic of electronic health records.  This provides immediate access to information in MyChart.  This includes consultation notes, operative notes, office notes, lab results and pathology reports.  If you have any questions about what you read please let us know at your next visit so we can discuss your concerns and take corrective action if need be.  We are right here with you.

## 2021-07-15 ENCOUNTER — Encounter (INDEPENDENT_AMBULATORY_CARE_PROVIDER_SITE_OTHER): Payer: Self-pay

## 2021-07-15 ENCOUNTER — Telehealth (INDEPENDENT_AMBULATORY_CARE_PROVIDER_SITE_OTHER): Payer: Self-pay | Admitting: Family Medicine

## 2021-07-15 NOTE — Telephone Encounter (Signed)
Dr. Raliegh Scarlet - Prior authorization approved for [id number]. Effective: 07/13/2021 - 02/13/2022. Patient sent approval message via mychart.

## 2021-07-30 ENCOUNTER — Encounter (INDEPENDENT_AMBULATORY_CARE_PROVIDER_SITE_OTHER): Payer: Self-pay | Admitting: Family Medicine

## 2021-07-30 ENCOUNTER — Ambulatory Visit (INDEPENDENT_AMBULATORY_CARE_PROVIDER_SITE_OTHER): Payer: Managed Care, Other (non HMO) | Admitting: Family Medicine

## 2021-07-30 VITALS — BP 127/88 | HR 87 | Temp 98.2°F | Ht <= 58 in | Wt 206.0 lb

## 2021-07-30 DIAGNOSIS — R632 Polyphagia: Secondary | ICD-10-CM | POA: Diagnosis not present

## 2021-07-30 DIAGNOSIS — E559 Vitamin D deficiency, unspecified: Secondary | ICD-10-CM

## 2021-07-30 DIAGNOSIS — Z6841 Body Mass Index (BMI) 40.0 and over, adult: Secondary | ICD-10-CM

## 2021-07-30 DIAGNOSIS — Z9189 Other specified personal risk factors, not elsewhere classified: Secondary | ICD-10-CM

## 2021-07-30 DIAGNOSIS — F430 Acute stress reaction: Secondary | ICD-10-CM

## 2021-07-30 DIAGNOSIS — J3089 Other allergic rhinitis: Secondary | ICD-10-CM | POA: Diagnosis not present

## 2021-07-30 DIAGNOSIS — E669 Obesity, unspecified: Secondary | ICD-10-CM

## 2021-07-30 DIAGNOSIS — E66813 Obesity, class 3: Secondary | ICD-10-CM

## 2021-07-30 MED ORDER — VITAMIN D (ERGOCALCIFEROL) 1.25 MG (50000 UNIT) PO CAPS: 50000.0000 [IU] | ORAL_CAPSULE | ORAL | 0 refills | Status: DC

## 2021-07-30 MED ORDER — FLUTICASONE PROPIONATE 50 MCG/ACT NA SUSP: NASAL | 0 refills | Status: AC

## 2021-08-06 NOTE — Progress Notes (Signed)
Chief Complaint:   OBESITY Mary Odonnell is here to discuss her progress with her obesity treatment plan along with follow-up of her obesity related diagnoses. Mary Odonnell is on keeping a food journal and adhering to recommended goals of 1000 calories and 100+ grams protein and states she is following her eating plan approximately 35% of the time. Mary Odonnell states she is walking 15 minutes 3 times per week.  Today's visit was #: 23 Starting weight: 225 lbs Starting date: 07/09/2021 Today's weight: 206 lbs Today's date: 07/30/2021 Total lbs lost to date: 19 Total lbs lost since last in-office visit: 1  Interim History: Mary Odonnell has been trying to journal and did it half the time. She missed most days on Friday, Saturday, and Sunday, but learned a lot about her habits. She is still trying to figure out how to accurately journal the meals.  Subjective:   1. Polyphagia Prescription for Mancel Parsons was provided at pt's last OV, but she has been unable to find it anywhere.  2. Vitamin D deficiency She is currently taking prescription vitamin D 50,000 IU each week. She denies nausea, vomiting or muscle weakness.  3. Environmental and seasonal allergies Symptoms are well controlled on current regimen.  4. Stress reaction with emotional eating Mary Odonnell is tolerating medication(s) well without side effects.  Medication compliance is good as patient endorses taking it as prescribed.  The patient denies additional concerns regarding this condition.      5. At risk for impaired metabolic function Mary Odonnell is at risk for impaired metabolic function due to prediabetes, obesity, and past medical history.  Assessment/Plan:  No orders of the defined types were placed in this encounter.   Medications Discontinued During This Encounter  Medication Reason   fluticasone (FLONASE) 50 MCG/ACT nasal spray Reorder   Vitamin D, Ergocalciferol, (DRISDOL) 1.25 MG (50000 UNIT) CAPS capsule Reorder     Meds ordered  this encounter  Medications   fluticasone (FLONASE) 50 MCG/ACT nasal spray    Sig: 1 spray each nostril following sinus rinses twice daily    Dispense:  16 g    Refill:  0   Vitamin D, Ergocalciferol, (DRISDOL) 1.25 MG (50000 UNIT) CAPS capsule    Sig: Take 1 capsule (50,000 Units total) by mouth every 7 (seven) days.    Dispense:  4 capsule    Refill:  0    30 d supply; OV for RF     1. Polyphagia Intensive lifestyle modifications are the first line treatment for this issue. We discussed several lifestyle modifications today and she will continue to work on diet, exercise and weight loss efforts. Orders and follow up as documented in patient record. Pt advised on how to locate [facility] at various pharmacies.  Counseling Polyphagia is excessive hunger. Causes can include: low blood sugars, hypERthyroidism, PMS, lack of sleep, stress, insulin resistance, diabetes, certain medications, and diets that are deficient in protein and fiber.   2. Vitamin D deficiency Low Vitamin D level contributes to fatigue and are associated with obesity, breast, and colon cancer. She agrees to continue to take prescription Vitamin D '@50'$ ,000 IU every week and will follow-up for routine testing of Vitamin D, at least 2-3 times per year to avoid over-replacement.  Refill- Vitamin D, Ergocalciferol, (DRISDOL) 1.25 MG (50000 UNIT) CAPS capsule; Take 1 capsule (50,000 Units total) by mouth every 7 (seven) days.  Dispense: 4 capsule; Refill: 0  3. Environmental and seasonal allergies Continue preventative strategies and current treatment plan.  Refill- fluticasone (FLONASE) 50 MCG/ACT nasal spray; 1 spray each nostril following sinus rinses twice daily  Dispense: 16 g; Refill: 0  4. Stress reaction with emotional eating Symptoms stable. Behavior modification techniques were discussed today to help [place] deal with her emotional/non-hunger eating behaviors.  Orders and follow up as documented in patient record. Pt  denies need for medication and counseling. Continue Wellbutrin.  5. At risk for impaired metabolic function Due to Mary Odonnell's current state of health and medical condition(s), she is at a significantly higher risk for impaired metabolic function.   At least 9 minutes was spent on counseling Mary Odonnell about these concerns today.  This places the patient at a much greater risk to subsequently develop cardio-pulmonary conditions that can negatively affect the patient's quality of life.  I stressed the importance of reversing these risks factors.  The initial goal is to lose at least 5-10% of starting weight to help reduce risk factors.  Counseling:  Intensive lifestyle modifications discussed with Mary Odonnell as the most appropriate first line treatment.  she will continue to work on diet, exercise, and weight loss efforts.  We will continue to reassess these conditions on a fairly regular basis in an attempt to decrease the patient's overall morbidity and mortality.  6. Obesity, Current BMI 43.2 Kandis is currently in the action stage of change. As such, her goal is to continue with weight loss efforts. She has agreed to keeping a food journal and adhering to recommended goals of 1000 calories and 100+ grams protein.   I explained to pt how to find nutritional information for restaurants and how to calculate proteins, calories, etc.  Exercise goals: For substantial health benefits, adults should do at least 150 minutes (2 hours and 30 minutes) a week of moderate-intensity, or 75 minutes (1 hour and 15 minutes) a week of vigorous-intensity aerobic physical activity, or an equivalent combination of moderate- and vigorous-intensity aerobic activity. Aerobic activity should be performed in episodes of at least 10 minutes, and preferably, it should be spread throughout the week.  Behavioral modification strategies: keeping a strict food journal.  Mary Odonnell has agreed to follow-up with our clinic in 3 weeks. She was  informed of the importance of frequent follow-up visits to maximize her success with intensive lifestyle modifications for her multiple health conditions.   Objective:   Blood pressure 127/88, pulse 87, temperature 98.2 F (36.8 C), height '4\' 10"'$  (1.473 m), weight 206 lb (93.4 kg), last menstrual period 01/24/2016, SpO2 96 %. Body mass index is 43.05 kg/m.  General: Cooperative, alert, well developed, in no acute distress. HEENT: Conjunctivae and lids unremarkable. Cardiovascular: Regular rhythm.  Lungs: Normal work of breathing. Neurologic: No focal deficits.   Lab Results  Component Value Date   CREATININE 0.90 06/18/2021   BUN 10 06/18/2021   NA 142 06/18/2021   K 4.2 06/18/2021   CL 104 06/18/2021   CO2 24 06/18/2021   Lab Results  Component Value Date   ALT 15 06/18/2021   AST 18 06/18/2021   ALKPHOS 71 06/18/2021   BILITOT 0.6 06/18/2021   Lab Results  Component Value Date   HGBA1C 6.4 (H) 06/18/2021   HGBA1C 6.0 (H) 03/05/2021   HGBA1C 6.0 09/29/2020   HGBA1C 6.3 (H) 06/12/2020   HGBA1C 6.1 (H) 08/21/2018   Lab Results  Component Value Date   INSULIN 16.5 06/18/2021   INSULIN 13.9 03/05/2021   INSULIN 13.2 06/12/2020   Lab Results  Component Value Date   TSH 1.300 06/18/2021  Lab Results  Component Value Date   CHOL 154 06/18/2021   HDL 63 06/18/2021   LDLCALC 77 06/18/2021   TRIG 75 06/18/2021   CHOLHDL 2.4 06/18/2021   Lab Results  Component Value Date   VD25OH 54.7 06/18/2021   VD25OH 47.9 03/05/2021   VD25OH 47.8 10/16/2020   Lab Results  Component Value Date   WBC 4.5 09/29/2020   HGB 13.5 09/29/2020   HCT 42 09/29/2020   MCV 84 06/12/2020   PLT 279 09/29/2020    Attestation Statements:   Reviewed by clinician on day of visit: allergies, medications, problem list, medical history, surgical history, family history, social history, and previous encounter notes.  I, Kathlene November, BS, CMA, am acting as transcriptionist for  [employer], DO.   I have reviewed the above documentation for accuracy and completeness, and I agree with the above. Marjory Sneddon, D.O.  [employer] was signed into law in 2016 which includes the topic of electronic health records.  This provides immediate access to information in MyChart.  This includes consultation notes, operative notes, office notes, lab results and pathology reports.  If you have any questions about what you read please let us know at your next visit so we can discuss your concerns and take corrective action if need be.  We are right here with you.

## 2021-08-08 ENCOUNTER — Other Ambulatory Visit (INDEPENDENT_AMBULATORY_CARE_PROVIDER_SITE_OTHER): Payer: Self-pay | Admitting: Family Medicine

## 2021-08-08 DIAGNOSIS — E559 Vitamin D deficiency, unspecified: Secondary | ICD-10-CM

## 2021-08-19 ENCOUNTER — Other Ambulatory Visit (INDEPENDENT_AMBULATORY_CARE_PROVIDER_SITE_OTHER): Payer: Self-pay | Admitting: Family Medicine

## 2021-08-19 ENCOUNTER — Encounter (INDEPENDENT_AMBULATORY_CARE_PROVIDER_SITE_OTHER): Payer: Self-pay

## 2021-08-19 DIAGNOSIS — F430 Acute stress reaction: Secondary | ICD-10-CM

## 2021-08-26 ENCOUNTER — Encounter (INDEPENDENT_AMBULATORY_CARE_PROVIDER_SITE_OTHER): Payer: Self-pay | Admitting: Family Medicine

## 2021-08-26 ENCOUNTER — Ambulatory Visit (INDEPENDENT_AMBULATORY_CARE_PROVIDER_SITE_OTHER): Payer: Managed Care, Other (non HMO) | Admitting: Family Medicine

## 2021-08-26 VITALS — BP 120/83 | HR 82 | Temp 98.7°F | Ht <= 58 in | Wt 206.0 lb

## 2021-08-26 DIAGNOSIS — E669 Obesity, unspecified: Secondary | ICD-10-CM

## 2021-08-26 DIAGNOSIS — Z6841 Body Mass Index (BMI) 40.0 and over, adult: Secondary | ICD-10-CM | POA: Diagnosis not present

## 2021-08-26 DIAGNOSIS — E559 Vitamin D deficiency, unspecified: Secondary | ICD-10-CM

## 2021-08-26 DIAGNOSIS — Z9189 Other specified personal risk factors, not elsewhere classified: Secondary | ICD-10-CM

## 2021-08-26 DIAGNOSIS — F430 Acute stress reaction: Secondary | ICD-10-CM | POA: Diagnosis not present

## 2021-08-26 MED ORDER — BUPROPION HCL ER (SR) 200 MG PO TB12: 200.0000 mg | ORAL_TABLET | Freq: Every morning | ORAL | 0 refills | Status: DC

## 2021-08-26 MED ORDER — VITAMIN D (ERGOCALCIFEROL) 1.25 MG (50000 UNIT) PO CAPS: 50000.0000 [IU] | ORAL_CAPSULE | ORAL | 0 refills | Status: DC

## 2021-08-27 ENCOUNTER — Other Ambulatory Visit (INDEPENDENT_AMBULATORY_CARE_PROVIDER_SITE_OTHER): Payer: Self-pay | Admitting: Family Medicine

## 2021-08-27 DIAGNOSIS — J3089 Other allergic rhinitis: Secondary | ICD-10-CM

## 2021-08-31 NOTE — Progress Notes (Signed)
Chief Complaint:   OBESITY Mary Odonnell is here to discuss her progress with her obesity treatment plan along with follow-up of her obesity related diagnoses. Mary Odonnell is on keeping a food journal and adhering to recommended goals of 1000 calories and 100+ grams protein and states she is following her eating plan approximately 70% of the time. Mary Odonnell states she is walking 1/2 mile 3 times per week.  Today's visit was #: 24 Starting weight: 225 lbs Starting date: 07/09/2021 Today's weight: 206 lbs Today's date: 08/26/2021 Total lbs lost to date: 19 Total lbs lost since last in-office visit: 0  Interim History: Mary Odonnell notes that she journaled and found out that she needs to eat more at home. She journaled over 55% of the time and was over in calories and way under in proteins. Pt reports a lot of stress at home and at work.  Subjective:   1. Stress reaction with emotional eating Mary Odonnell reports increased stress with husband's Mom and husband's stress and work stressors with being short staffed.  2. Vitamin D deficiency She is currently taking prescription vitamin D 50,000 IU each week. She denies nausea, vomiting or muscle weakness.  3. At risk for deficient intake of food Mary Odonnell is at risk for deficient intake of food due to inadequate protein intake.  Assessment/Plan:  No orders of the defined types were placed in this encounter.   Medications Discontinued During This Encounter  Medication Reason   buPROPion ER (WELLBUTRIN SR) 100 MG 12 hr tablet Reorder   Vitamin D, Ergocalciferol, (DRISDOL) 1.25 MG (50000 UNIT) CAPS capsule Reorder     Meds ordered this encounter  Medications   buPROPion (WELLBUTRIN SR) 200 MG 12 hr tablet    Sig: Take 1 tablet (200 mg total) by mouth in the morning.    Dispense:  90 tablet    Refill:  0    3 mo supply per insurance;  ** OV for RF **   Do not send RF request   Vitamin D, Ergocalciferol, (DRISDOL) 1.25 MG (50000 UNIT) CAPS capsule    Sig:  Take 1 capsule (50,000 Units total) by mouth every 7 (seven) days.    Dispense:  4 capsule    Refill:  0    30 d supply; OV for RF     1. Stress reaction with emotional eating Behavior modification techniques were discussed today to help Mary Odonnell deal with her emotional/non-hunger eating behaviors.  Orders and follow up as documented in patient record.   Increase & Refill- buPROPion (WELLBUTRIN SR) 200 MG 12 hr tablet; Take 1 tablet (200 mg total) by mouth in the morning.  Dispense: 90 tablet; Refill: 0  2. Vitamin D deficiency Low Vitamin D level contributes to fatigue and are associated with obesity, breast, and colon cancer. She agrees to continue to take prescription Vitamin D '@50'$ ,000 IU every week and will follow-up for routine testing of Vitamin D, at least 2-3 times per year to avoid over-replacement.  Refill- Vitamin D, Ergocalciferol, (DRISDOL) 1.25 MG (50000 UNIT) CAPS capsule; Take 1 capsule (50,000 Units total) by mouth every 7 (seven) days.  Dispense: 4 capsule; Refill: 0  3. At risk for deficient intake of food Mary Odonnell was given extensive education and counseling today of more than 15 minutes on risks associated with deficient food intake.  Counseled her on the importance of following our prescribed meal plan and eating adequate amounts of protein.  Discussed with Mary Odonnell that inadequate food intake over longer  periods of time can slow their metabolism down significantly.   4. Obesity, Current BMI 43.1 Mary Odonnell is currently in the action stage of change. As such, her goal is to continue with weight loss efforts. She has agreed to keeping a food journal and adhering to recommended goals of 1000 calories and 100+ grams protein.   Exercise goals:  As is  Behavioral modification strategies: increasing lean protein intake and keeping a strict food journal.  Mary Odonnell has agreed to follow-up with our clinic in 3 weeks. She was informed of the importance of frequent follow-up visits to  maximize her success with intensive lifestyle modifications for her multiple health conditions.   Objective:   Blood pressure 120/83, pulse 82, temperature 98.7 F (37.1 C), height '4\' 10"'$  (1.473 m), weight 206 lb (93.4 kg), last menstrual period 01/24/2016, SpO2 98 %. Body mass index is 43.05 kg/m.  General: Cooperative, alert, well developed, in no acute distress. HEENT: Conjunctivae and lids unremarkable. Cardiovascular: Regular rhythm.  Lungs: Normal work of breathing. Neurologic: No focal deficits.   Lab Results  Component Value Date   CREATININE 0.90 06/18/2021   BUN 10 06/18/2021   NA 142 06/18/2021   K 4.2 06/18/2021   CL 104 06/18/2021   CO2 24 06/18/2021   Lab Results  Component Value Date   ALT 15 06/18/2021   AST 18 06/18/2021   ALKPHOS 71 06/18/2021   BILITOT 0.6 06/18/2021   Lab Results  Component Value Date   HGBA1C 6.4 (H) 06/18/2021   HGBA1C 6.0 (H) 03/05/2021   HGBA1C 6.0 09/29/2020   HGBA1C 6.3 (H) 06/12/2020   HGBA1C 6.1 (H) 08/21/2018   Lab Results  Component Value Date   INSULIN 16.5 06/18/2021   INSULIN 13.9 03/05/2021   INSULIN 13.2 06/12/2020   Lab Results  Component Value Date   TSH 1.300 06/18/2021   Lab Results  Component Value Date   CHOL 154 06/18/2021   HDL 63 06/18/2021   LDLCALC 77 06/18/2021   TRIG 75 06/18/2021   CHOLHDL 2.4 06/18/2021   Lab Results  Component Value Date   VD25OH 54.7 06/18/2021   VD25OH 47.9 03/05/2021   VD25OH 47.8 10/16/2020   Lab Results  Component Value Date   WBC 4.5 09/29/2020   HGB 13.5 09/29/2020   HCT 42 09/29/2020   MCV 84 06/12/2020   PLT 279 09/29/2020    Attestation Statements:   Reviewed by clinician on day of visit: allergies, medications, problem list, medical history, surgical history, family history, social history, and previous encounter notes.  I, Kathlene November, BS, CMA, am acting as transcriptionist for [employer], DO.   I have reviewed the above documentation  for accuracy and completeness, and I agree with the above. Marjory Sneddon, D.O.  The [place] was signed into law in 2016 which includes the topic of electronic health records.  This provides immediate access to information in MyChart.  This includes consultation notes, operative notes, office notes, lab results and pathology reports.  If you have any questions about what you read please let us know at your next visit so we can discuss your concerns and take corrective action if need be.  We are right here with you.

## 2021-09-17 ENCOUNTER — Encounter (INDEPENDENT_AMBULATORY_CARE_PROVIDER_SITE_OTHER): Payer: Self-pay | Admitting: Family Medicine

## 2021-09-17 ENCOUNTER — Ambulatory Visit (INDEPENDENT_AMBULATORY_CARE_PROVIDER_SITE_OTHER): Payer: Managed Care, Other (non HMO) | Admitting: Family Medicine

## 2021-09-17 VITALS — BP 134/84 | HR 78 | Temp 97.6°F | Ht <= 58 in | Wt 206.0 lb

## 2021-09-17 DIAGNOSIS — Z6841 Body Mass Index (BMI) 40.0 and over, adult: Secondary | ICD-10-CM | POA: Diagnosis not present

## 2021-09-17 DIAGNOSIS — Z9189 Other specified personal risk factors, not elsewhere classified: Secondary | ICD-10-CM

## 2021-09-17 DIAGNOSIS — E559 Vitamin D deficiency, unspecified: Secondary | ICD-10-CM

## 2021-09-17 DIAGNOSIS — E669 Obesity, unspecified: Secondary | ICD-10-CM

## 2021-09-17 MED ORDER — VITAMIN D (ERGOCALCIFEROL) 1.25 MG (50000 UNIT) PO CAPS: 50000.0000 [IU] | ORAL_CAPSULE | ORAL | 0 refills | Status: DC

## 2021-09-17 MED ORDER — SEMAGLUTIDE-WEIGHT MANAGEMENT 0.25 MG/0.5ML ~~LOC~~ SOAJ: 0.2500 mg | SUBCUTANEOUS | 0 refills | Status: DC

## 2021-09-21 ENCOUNTER — Encounter (INDEPENDENT_AMBULATORY_CARE_PROVIDER_SITE_OTHER): Payer: Self-pay | Admitting: Family Medicine

## 2021-09-21 ENCOUNTER — Other Ambulatory Visit (INDEPENDENT_AMBULATORY_CARE_PROVIDER_SITE_OTHER): Payer: Self-pay

## 2021-09-21 MED ORDER — SEMAGLUTIDE-WEIGHT MANAGEMENT 0.25 MG/0.5ML ~~LOC~~ SOAJ: 0.2500 mg | SUBCUTANEOUS | 0 refills | Status: DC

## 2021-09-24 NOTE — Progress Notes (Unsigned)
Chief Complaint:   OBESITY Mary Odonnell is here to discuss her progress with her obesity treatment plan along with follow-up of her obesity related diagnoses. [name] is on keeping a food journal and adhering to recommended goals of 1000 calories and 100+ grams protein and states she is following her eating plan approximately 25% of the time. [name] states she is walking 15 minutes 4 times per week.  Today's visit was #: 25 Starting weight: 225 lbs Starting date: 07/09/2021 Today's weight: 206 lbs Today's date: 09/17/2021 Total lbs lost to date: 19 Total lbs lost since last in-office visit: 0  Interim History: [name] did journal and brought in her log. She is over in calories almost everyday and has been under in proteins everyday. She eats out 2-4 days a week.  Subjective:   1. Vitamin D deficiency She is currently taking prescription vitamin D 50,000 IU each week. She denies nausea, vomiting or muscle weakness.  2. At risk for malnutrition [name] is at risk for malnutrition due to inadequate nutritional intake.  Assessment/Plan:  No orders of the defined types were placed in this encounter.   Medications Discontinued During This Encounter  Medication Reason   Semaglutide-Weight Management 0.25 MG/0.5ML SOAJ Reorder   Vitamin D, Ergocalciferol, (DRISDOL) 1.25 MG (50000 UNIT) CAPS capsule Reorder     Meds ordered this encounter  Medications   Vitamin D, Ergocalciferol, (DRISDOL) 1.25 MG (50000 UNIT) CAPS capsule    Sig: Take 1 capsule (50,000 Units total) by mouth every 7 (seven) days.    Dispense:  4 capsule    Refill:  0    30 d supply; OV for RF   DISCONTD: Semaglutide-Weight Management 0.25 MG/0.5ML SOAJ    Sig: Inject 0.25 mg into the skin once a week. Every thursday    Dispense:  2 mL    Refill:  0     1. Vitamin D deficiency Low Vitamin D level contributes to fatigue and are associated with obesity, breast, and colon cancer. She agrees to continue to take  prescription Vitamin D '@50'$ ,000 IU every week and will follow-up for routine testing of Vitamin D, at least 2-3 times per year to avoid over-replacement.  Refill- Vitamin D, Ergocalciferol, (DRISDOL) 1.25 MG (50000 UNIT) CAPS capsule; Take 1 capsule (50,000 Units total) by mouth every 7 (seven) days.  Dispense: 4 capsule; Refill: 0  2. At risk for malnutrition [name] was given extensive malnutrition prevention education and counseling today of more than 9 minutes.  Counseled her that malnutrition refers to inappropriate nutrients or not the right balance of nutrients for optimal health.  Discussed with Ezzie Senat Salomon that it is absolutely possible to be malnourished but yet obese.  Risk factors, including but not limited to, inappropriate dietary choices, difficulty with obtaining food due to physical or financial limitations, and various physical and mental health conditions were reviewed with [name].   3. Obesity, Current BMI 43.1 [name] is currently in the action stage of change. As such, her goal is to continue with weight loss efforts. She has agreed to keeping a food journal and adhering to recommended goals of 1000 calories and 100+ grams protein but use category 1 as a guide.   We wrote a script for [facility] in June 2023, and it was approved in early July, but pt hasn't started it yet because she can't find it. Prescription resent today and counseling done on how to find medication.  Exercise goals:  As is  Scientist, water quality  modification strategies: increasing lean protein intake, decreasing simple carbohydrates, planning for success, and keeping a strict food journal.  [name] has agreed to follow-up with our clinic in 2-3 weeks. She was informed of the importance of frequent follow-up visits to maximize her success with intensive lifestyle modifications for her multiple health conditions.   Objective:   Blood pressure 134/84, pulse 78, temperature 97.6 F (36.4 C), height '4\' 10"'$  (1.473 m),  weight 206 lb (93.4 kg), last menstrual period 01/24/2016, SpO2 99 %. Body mass index is 43.05 kg/m.  General: Cooperative, alert, well developed, in no acute distress. HEENT: Conjunctivae and lids unremarkable. Cardiovascular: Regular rhythm.  Lungs: Normal work of breathing. Neurologic: No focal deficits.   Lab Results  Component Value Date   CREATININE 0.90 06/18/2021   BUN 10 06/18/2021   NA 142 06/18/2021   K 4.2 06/18/2021   CL 104 06/18/2021   CO2 24 06/18/2021   Lab Results  Component Value Date   ALT 15 06/18/2021   AST 18 06/18/2021   ALKPHOS 71 06/18/2021   BILITOT 0.6 06/18/2021   Lab Results  Component Value Date   HGBA1C 6.4 (H) 06/18/2021   HGBA1C 6.0 (H) 03/05/2021   HGBA1C 6.0 09/29/2020   HGBA1C 6.3 (H) 06/12/2020   HGBA1C 6.1 (H) 08/21/2018   Lab Results  Component Value Date   INSULIN 16.5 06/18/2021   INSULIN 13.9 03/05/2021   INSULIN 13.2 06/12/2020   Lab Results  Component Value Date   TSH 1.300 06/18/2021   Lab Results  Component Value Date   CHOL 154 06/18/2021   HDL 63 06/18/2021   LDLCALC 77 06/18/2021   TRIG 75 06/18/2021   CHOLHDL 2.4 06/18/2021   Lab Results  Component Value Date   VD25OH 54.7 06/18/2021   VD25OH 47.9 03/05/2021   VD25OH 47.8 10/16/2020   Lab Results  Component Value Date   WBC 4.5 09/29/2020   HGB 13.5 09/29/2020   HCT 42 09/29/2020   MCV 84 06/12/2020   PLT 279 09/29/2020    Attestation Statements:   Reviewed by clinician on day of visit: allergies, medications, problem list, medical history, surgical history, family history, social history, and previous encounter notes.  I, Kathlene November, BS, CMA, am acting as transcriptionist for [employer], DO.   I have reviewed the above documentation for accuracy and completeness, and I agree with the above. Marjory Sneddon, D.O.  The [place] was signed into law in 2016 which includes the topic of electronic health records.  This  provides immediate access to information in MyChart.  This includes consultation notes, operative notes, office notes, lab results and pathology reports.  If you have any questions about what you read please let us know at your next visit so we can discuss your concerns and take corrective action if need be.  We are right here with you.

## 2021-10-08 ENCOUNTER — Ambulatory Visit (INDEPENDENT_AMBULATORY_CARE_PROVIDER_SITE_OTHER): Payer: Managed Care, Other (non HMO) | Admitting: Family Medicine

## 2021-10-08 ENCOUNTER — Encounter (INDEPENDENT_AMBULATORY_CARE_PROVIDER_SITE_OTHER): Payer: Self-pay | Admitting: Family Medicine

## 2021-10-08 VITALS — BP 128/82 | HR 89 | Temp 98.2°F | Ht <= 58 in | Wt 206.0 lb

## 2021-10-08 DIAGNOSIS — R7303 Prediabetes: Secondary | ICD-10-CM | POA: Diagnosis not present

## 2021-10-08 DIAGNOSIS — E669 Obesity, unspecified: Secondary | ICD-10-CM | POA: Diagnosis not present

## 2021-10-08 DIAGNOSIS — E559 Vitamin D deficiency, unspecified: Secondary | ICD-10-CM | POA: Diagnosis not present

## 2021-10-08 DIAGNOSIS — Z9189 Other specified personal risk factors, not elsewhere classified: Secondary | ICD-10-CM

## 2021-10-08 DIAGNOSIS — Z6841 Body Mass Index (BMI) 40.0 and over, adult: Secondary | ICD-10-CM

## 2021-10-08 DIAGNOSIS — E66813 Obesity, class 3: Secondary | ICD-10-CM

## 2021-10-08 MED ORDER — VITAMIN D (ERGOCALCIFEROL) 1.25 MG (50000 UNIT) PO CAPS: 50000.0000 [IU] | ORAL_CAPSULE | ORAL | 0 refills | Status: DC

## 2021-10-14 NOTE — Progress Notes (Signed)
Chief Complaint:   OBESITY Mary Odonnell is here to discuss her progress with her obesity treatment plan along with follow-up of her obesity related diagnoses. Aminah is on keeping a food journal and adhering to recommended goals of 1000 calories and 100+ grams protein and states she is following her eating plan approximately 25% of the time. Taralynn states she is walking 15 minutes 3-4 times per week.  Today's visit was #: 26 Starting weight: 225 lbs Starting date: 07/09/2021 Today's weight: 206 lbs Today's date: 10/08/2021 Total lbs lost to date: 19 Total lbs lost since last in-office visit: 0  Interim History: Jaquita forgot her journaling log. She maybe journaled a couple of days, then wasn't able to. She found it helpful in that she saw she eats to many calories and only hit 70-80 grams protein. Pt eats too many desserts/sweets (Ice cream) and too many starches (rice, potatoes, french fries).  Subjective:   1. Pre-diabetes Taliah is only on Metformin and not Wegovy. Her A1c was 5.9 on 08/20/2021 and prior 6.4 on 06/18/2021.  2. Vitamin D deficiency She is currently taking prescription vitamin D 50,000 IU each week. She denies nausea, vomiting or muscle weakness.  3. At risk for impaired metabolic function Truly is at increased risk for impaired metabolic function due to current nutrition and muscle mass.  4. Obesity, Current BMI 43.1 We wrote for [facility] in June 2023, and pt is still unable to get it, therefore, she hasn't started it yet.  Assessment/Plan:  No orders of the defined types were placed in this encounter.   Medications Discontinued During This Encounter  Medication Reason   Vitamin D, Ergocalciferol, (DRISDOL) 1.25 MG (50000 UNIT) CAPS capsule Reorder     Meds ordered this encounter  Medications   Vitamin D, Ergocalciferol, (DRISDOL) 1.25 MG (50000 UNIT) CAPS capsule    Sig: Take 1 capsule (50,000 Units total) by mouth every 7 (seven) days.    Dispense:  4  capsule    Refill:  0    30 d supply; OV for RF     1. Pre-diabetes Ermel will continue to work on weight loss, exercise, prudent nutritional plan, and decreasing simple carbohydrates to help decrease the risk of diabetes. Continue Metformin.  2. Vitamin D deficiency Low Vitamin D level contributes to fatigue and are associated with obesity, breast, and colon cancer. She agrees to continue to take prescription Vitamin D '@50'$ ,000 IU every week and will follow-up for routine testing of Vitamin D, at least 2-3 times per year to avoid over-replacement.  Refill- Vitamin D, Ergocalciferol, (DRISDOL) 1.25 MG (50000 UNIT) CAPS capsule; Take 1 capsule (50,000 Units total) by mouth every 7 (seven) days.  Dispense: 4 capsule; Refill: 0  3. At risk for impaired metabolic function Due to Shoshanah's current state of health and medical condition(s), she is at a significantly higher risk for impaired metabolic function.   At least 9 minutes was spent on counseling Evella about these concerns today.  This places the patient at a much greater risk to subsequently develop cardio-pulmonary conditions that can negatively affect the patient's quality of life.  I stressed the importance of reversing these risks factors.  The initial goal is to lose at least 5-10% of starting weight to help reduce risk factors.  Counseling:  Intensive lifestyle modifications discussed with Emiyah as the most appropriate first line treatment.  she will continue to work on diet, exercise, and weight loss efforts.  We will continue to reassess these  conditions on a fairly regular basis in an attempt to decrease the patient's overall morbidity and mortality.  4. Obesity, Current BMI 43.1 Mathew is currently in the action stage of change. As such, her goal is to continue with weight loss efforts. She has agreed to keeping a food journal and adhering to recommended goals of 1000 calories and 100+ grams protein.   Kamyia will journal for next  time and use category 1 as a guide. Decrease sweets/desserts to only 2 days per week.  Exercise goals:  As is  Behavioral modification strategies: increasing lean protein intake, decreasing simple carbohydrates, and planning for success.  Misako has agreed to follow-up with our clinic in 3 weeks. She was informed of the importance of frequent follow-up visits to maximize her success with intensive lifestyle modifications for her multiple health conditions.   Objective:   Blood pressure 128/82, pulse 89, temperature 98.2 F (36.8 C), temperature source Oral, height '4\' 10"'$  (1.473 m), weight 206 lb (93.4 kg), last menstrual period 01/24/2016, SpO2 99 %. Body mass index is 43.05 kg/m.  General: Cooperative, alert, well developed, in no acute distress. HEENT: Conjunctivae and lids unremarkable. Cardiovascular: Regular rhythm.  Lungs: Normal work of breathing. Neurologic: No focal deficits.   Lab Results  Component Value Date   CREATININE 0.90 06/18/2021   BUN 10 06/18/2021   NA 142 06/18/2021   K 4.2 06/18/2021   CL 104 06/18/2021   CO2 24 06/18/2021   Lab Results  Component Value Date   ALT 15 06/18/2021   AST 18 06/18/2021   ALKPHOS 71 06/18/2021   BILITOT 0.6 06/18/2021   Lab Results  Component Value Date   HGBA1C 6.4 (H) 06/18/2021   HGBA1C 6.0 (H) 03/05/2021   HGBA1C 6.0 09/29/2020   HGBA1C 6.3 (H) 06/12/2020   HGBA1C 6.1 (H) 08/21/2018   Lab Results  Component Value Date   INSULIN 16.5 06/18/2021   INSULIN 13.9 03/05/2021   INSULIN 13.2 06/12/2020   Lab Results  Component Value Date   TSH 1.300 06/18/2021   Lab Results  Component Value Date   CHOL 154 06/18/2021   HDL 63 06/18/2021   LDLCALC 77 06/18/2021   TRIG 75 06/18/2021   CHOLHDL 2.4 06/18/2021   Lab Results  Component Value Date   VD25OH 54.7 06/18/2021   VD25OH 47.9 03/05/2021   VD25OH 47.8 10/16/2020   Lab Results  Component Value Date   WBC 4.5 09/29/2020   HGB 13.5 09/29/2020   HCT  42 09/29/2020   MCV 84 06/12/2020   PLT 279 09/29/2020   Attestation Statements:   Reviewed by clinician on day of visit: allergies, medications, problem list, medical history, surgical history, family history, social history, and previous encounter notes.  I, Kathlene November, BS, CMA, am acting as transcriptionist for [employer], DO.   I have reviewed the above documentation for accuracy and completeness, and I agree with the above. Marjory Sneddon, D.O.  The [place] was signed into law in 2016 which includes the topic of electronic health records.  This provides immediate access to information in MyChart.  This includes consultation notes, operative notes, office notes, lab results and pathology reports.  If you have any questions about what you read please let us know at your next visit so we can discuss your concerns and take corrective action if need be.  We are right here with you.

## 2021-11-05 ENCOUNTER — Ambulatory Visit (INDEPENDENT_AMBULATORY_CARE_PROVIDER_SITE_OTHER): Payer: Managed Care, Other (non HMO) | Admitting: Family Medicine

## 2021-11-10 ENCOUNTER — Other Ambulatory Visit (INDEPENDENT_AMBULATORY_CARE_PROVIDER_SITE_OTHER): Payer: Self-pay | Admitting: Family Medicine

## 2021-11-10 DIAGNOSIS — E559 Vitamin D deficiency, unspecified: Secondary | ICD-10-CM

## 2021-11-26 ENCOUNTER — Other Ambulatory Visit (HOSPITAL_COMMUNITY): Payer: Self-pay

## 2021-11-26 ENCOUNTER — Encounter (INDEPENDENT_AMBULATORY_CARE_PROVIDER_SITE_OTHER): Payer: Self-pay | Admitting: Family Medicine

## 2021-11-26 ENCOUNTER — Ambulatory Visit (INDEPENDENT_AMBULATORY_CARE_PROVIDER_SITE_OTHER): Payer: Managed Care, Other (non HMO) | Admitting: Family Medicine

## 2021-11-26 VITALS — BP 122/85 | HR 83 | Temp 98.1°F | Ht <= 58 in | Wt 207.6 lb

## 2021-11-26 DIAGNOSIS — R7303 Prediabetes: Secondary | ICD-10-CM

## 2021-11-26 DIAGNOSIS — F430 Acute stress reaction: Secondary | ICD-10-CM

## 2021-11-26 DIAGNOSIS — E669 Obesity, unspecified: Secondary | ICD-10-CM | POA: Diagnosis not present

## 2021-11-26 DIAGNOSIS — E559 Vitamin D deficiency, unspecified: Secondary | ICD-10-CM | POA: Diagnosis not present

## 2021-11-26 DIAGNOSIS — Z6841 Body Mass Index (BMI) 40.0 and over, adult: Secondary | ICD-10-CM

## 2021-11-26 MED ORDER — VITAMIN D (ERGOCALCIFEROL) 1.25 MG (50000 UNIT) PO CAPS: 50000.0000 [IU] | ORAL_CAPSULE | ORAL | 0 refills | Status: DC

## 2021-11-26 MED ORDER — SEMAGLUTIDE-WEIGHT MANAGEMENT 0.25 MG/0.5ML ~~LOC~~ SOAJ
0.2500 mg | SUBCUTANEOUS | 0 refills | Status: DC
  Filled 2021-11-26: qty 2, 28d supply, fill #0

## 2021-11-26 NOTE — Patient Instructions (Signed)
The 10-year ASCVD risk score (Arnett DK, et al., 2019) is: 2.9%   Values used to calculate the score:     Age: 56 years     Sex: Female     Is Non-Hispanic African American: Yes     Diabetic: No     Tobacco smoker: No     Systolic Blood Pressure: 562 mmHg     Is BP treated: Yes     HDL Cholesterol: 63 mg/dL     Total Cholesterol: 154 mg/dL

## 2021-12-07 ENCOUNTER — Other Ambulatory Visit (HOSPITAL_COMMUNITY): Payer: Self-pay

## 2021-12-09 NOTE — Progress Notes (Signed)
Chief Complaint:   OBESITY Mary Odonnell is here to discuss her progress with her obesity treatment plan along with follow-up of her obesity related diagnoses. Mary Odonnell is on keeping a food journal and adhering to recommended goals of 1000 calories and 100 protein and states she is following her eating plan approximately 25% of the time. Mary Odonnell states she is walking 20-30 minutes 3+ times per week.  Today's visit was #: 94 Starting weight: 225 lbs Starting date: 07/09/2021 Today's weight: 207 lbs Today's date: 11/26/2021 Total lbs lost to date: 18 lbs Total lbs lost since last in-office visit: +1 lb  Interim History: More stress at work and struggling with carb cravings. We wrote for [facility], 07/09/2021 and patient still cannot find it.   Subjective:   1. Prediabetes A1c 6.4 and recently improved to under 6.0 per patient through work.  (5.9 on 08/20/2021).  Positive for cravings especially when she eats sweets.    2. Vitamin D deficiency Vitamin D last checked 4-5 months at 54.7 at goal.   3. Stress reaction with emotional eating More stress, but doing well with handling it per patient.   Assessment/Plan:  No orders of the defined types were placed in this encounter.   Medications Discontinued During This Encounter  Medication Reason   Semaglutide-Weight Management 0.25 MG/0.5ML SOAJ Reorder   Vitamin D, Ergocalciferol, (DRISDOL) 1.25 MG (50000 UNIT) CAPS capsule Reorder     Meds ordered this encounter  Medications   DISCONTD: Vitamin D, Ergocalciferol, (DRISDOL) 1.25 MG (50000 UNIT) CAPS capsule    Sig: Take 1 capsule (50,000 Units total) by mouth every 7 (seven) days.    Dispense:  4 capsule    Refill:  0    30 d supply; OV for RF   Semaglutide-Weight Management 0.25 MG/0.5ML SOAJ    Sig: Inject 0.25 mg into the skin once a week. Every Thursday    Dispense:  2 mL    Refill:  0     1. Prediabetes Continue metformin 750 mg XR daily.  Patient declines inclines increase  in dose.   2. Vitamin D deficiency Refill - Vitamin D, Ergocalciferol, (DRISDOL) 1.25 MG (50000 UNIT) CAPS capsule; Take 1 capsule (50,000 Units total) by mouth every 7 (seven) days.  Dispense: 4 capsule; Refill: 0  3. Stress reaction with emotional eating Patient declined need for increase in Wellbutrin dose.   4. Obesity, Current BMI 43.4 Resend Wegovy to different pharmacy in hopes they get it in.   Refill - Semaglutide-Weight Management 0.25 MG/0.5ML SOAJ; Inject 0.25 mg into the skin once a week. Every Thursday  Dispense: 2 mL; Refill: 0  Mary Odonnell is currently in the action stage of change. As such, her goal is to continue with weight loss efforts. She has agreed to the Category 1 Plan or keeping a food journal and adhering to recommended goals of 1000 calories and 100+ protein daily.   Exercise goals:  At least 30 minutes 3 days per week.   Behavioral modification strategies: increasing lean protein intake, decreasing simple carbohydrates, and planning for success.  Mary Odonnell has agreed to follow-up with our clinic in 3 weeks. She was informed of the importance of frequent follow-up visits to maximize her success with intensive lifestyle modifications for her multiple health conditions.   Objective:   Blood pressure 122/85, pulse 83, temperature 98.1 F (36.7 C), height '4\' 10"'$  (1.473 m), weight 207 lb 9.6 oz (94.2 kg), last menstrual period 01/24/2016, SpO2 95 %. Body mass index  is 43.39 kg/m.  General: Cooperative, alert, well developed, in no acute distress. HEENT: Conjunctivae and lids unremarkable. Cardiovascular: Regular rhythm.  Lungs: Normal work of breathing. Neurologic: No focal deficits.   Lab Results  Component Value Date   CREATININE 0.90 06/18/2021   BUN 10 06/18/2021   NA 142 06/18/2021   K 4.2 06/18/2021   CL 104 06/18/2021   CO2 24 06/18/2021   Lab Results  Component Value Date   ALT 15 06/18/2021   AST 18 06/18/2021   ALKPHOS 71 06/18/2021   BILITOT  0.6 06/18/2021   Lab Results  Component Value Date   HGBA1C 6.4 (H) 06/18/2021   HGBA1C 6.0 (H) 03/05/2021   HGBA1C 6.0 09/29/2020   HGBA1C 6.3 (H) 06/12/2020   HGBA1C 6.1 (H) 08/21/2018   Lab Results  Component Value Date   INSULIN 16.5 06/18/2021   INSULIN 13.9 03/05/2021   INSULIN 13.2 06/12/2020   Lab Results  Component Value Date   TSH 1.300 06/18/2021   Lab Results  Component Value Date   CHOL 154 06/18/2021   HDL 63 06/18/2021   LDLCALC 77 06/18/2021   TRIG 75 06/18/2021   CHOLHDL 2.4 06/18/2021   Lab Results  Component Value Date   VD25OH 54.7 06/18/2021   VD25OH 47.9 03/05/2021   VD25OH 47.8 10/16/2020   Lab Results  Component Value Date   WBC 4.5 09/29/2020   HGB 13.5 09/29/2020   HCT 42 09/29/2020   MCV 84 06/12/2020   PLT 279 09/29/2020   No results found for: "IRON", "TIBC", "FERRITIN"  Attestation Statements:   Reviewed by clinician on day of visit: allergies, medications, problem list, medical history, surgical history, family history, social history, and previous encounter notes.  I, Davy Pique, RMA, am acting as Location manager for [employer], DO.   I have reviewed the above documentation for accuracy and completeness, and I agree with the above. Marjory Sneddon, D.O.  [place] was [place] into law in 2016 which includes the topic of electronic health records.  This provides immediate access to information in MyChart.  This includes consultation notes, operative notes, office notes, lab results and pathology reports.  If you have any questions about what you read please let us know at your next visit so we can discuss your concerns and take corrective action if need be.  We are right here with you.

## 2021-12-14 ENCOUNTER — Encounter (INDEPENDENT_AMBULATORY_CARE_PROVIDER_SITE_OTHER): Payer: Self-pay | Admitting: Physician Assistant

## 2021-12-14 ENCOUNTER — Ambulatory Visit (INDEPENDENT_AMBULATORY_CARE_PROVIDER_SITE_OTHER): Payer: Managed Care, Other (non HMO) | Admitting: Physician Assistant

## 2021-12-14 VITALS — BP 133/92 | HR 86 | Temp 98.1°F | Ht <= 58 in | Wt 205.0 lb

## 2021-12-14 DIAGNOSIS — E669 Obesity, unspecified: Secondary | ICD-10-CM | POA: Diagnosis not present

## 2021-12-14 DIAGNOSIS — F430 Acute stress reaction: Secondary | ICD-10-CM | POA: Diagnosis not present

## 2021-12-14 DIAGNOSIS — E559 Vitamin D deficiency, unspecified: Secondary | ICD-10-CM | POA: Diagnosis not present

## 2021-12-14 DIAGNOSIS — Z6841 Body Mass Index (BMI) 40.0 and over, adult: Secondary | ICD-10-CM

## 2021-12-14 DIAGNOSIS — R7303 Prediabetes: Secondary | ICD-10-CM

## 2021-12-14 LAB — HEPATIC FUNCTION PANEL
ALT: 13 U/L (ref 7–35)
AST: 14 (ref 13–35)
Alkaline Phosphatase: 76 (ref 25–125)
Bilirubin, Total: 0.4

## 2021-12-14 LAB — COMPREHENSIVE METABOLIC PANEL
Albumin: 4.3 (ref 3.5–5.0)
Calcium: 9.2 (ref 8.7–10.7)
Globulin: 2.6
eGFR: 74

## 2021-12-14 LAB — CBC AND DIFFERENTIAL
HCT: 41 (ref 36–46)
Platelets: 302 10*3/uL (ref 150–400)
WBC: 4

## 2021-12-14 LAB — LIPID PANEL
Cholesterol: 172 (ref 0–200)
HDL: 64 (ref 35–70)
Triglycerides: 75 (ref 40–160)

## 2021-12-14 LAB — BASIC METABOLIC PANEL
BUN: 11 (ref 4–21)
Chloride: 104 (ref 99–108)
Creatinine: 0.9 (ref 0.5–1.1)
Glucose: 88
Potassium: 4.3 mEq/L (ref 3.5–5.1)
Sodium: 142 (ref 137–147)

## 2021-12-14 LAB — VITAMIN D 25 HYDROXY (VIT D DEFICIENCY, FRACTURES): Vit D, 25-Hydroxy: 51.5

## 2021-12-14 LAB — HEMOGLOBIN A1C: Hemoglobin A1C: 5.8

## 2021-12-14 LAB — CBC: RBC: 4.95 (ref 3.87–5.11)

## 2021-12-14 LAB — TSH: TSH: 0.8 (ref 0.41–5.90)

## 2021-12-14 MED ORDER — BUPROPION HCL ER (SR) 200 MG PO TB12: 200.0000 mg | ORAL_TABLET | Freq: Every morning | ORAL | 0 refills | Status: DC

## 2021-12-14 MED ORDER — VITAMIN D (ERGOCALCIFEROL) 1.25 MG (50000 UNIT) PO CAPS: 50000.0000 [IU] | ORAL_CAPSULE | ORAL | 0 refills | Status: DC

## 2021-12-15 LAB — LIPID PANEL WITH LDL/HDL RATIO
Cholesterol, Total: 172 mg/dL (ref 100–199)
HDL: 65 mg/dL (ref >39)
LDL Chol Calc (NIH): 94 mg/dL (ref 0–99)
LDL/HDL Ratio: 1.4 ratio (ref 0.0–3.2)
Triglycerides: 65 mg/dL (ref 0–149)
VLDL Cholesterol Cal: 13 mg/dL (ref 5–40)

## 2021-12-15 LAB — CBC WITH DIFFERENTIAL/PLATELET
Basophils Absolute: 0 10*3/uL (ref 0.0–0.2)
Basos: 1 %
EOS (ABSOLUTE): 0 10*3/uL (ref 0.0–0.4)
Eos: 1 %
Hematocrit: 43.3 % (ref 34.0–46.6)
Hemoglobin: 14.1 g/dL (ref 11.1–15.9)
Immature Grans (Abs): 0 10*3/uL (ref 0.0–0.1)
Immature Granulocytes: 0 %
Lymphocytes Absolute: 2.1 10*3/uL (ref 0.7–3.1)
Lymphs: 50 %
MCH: 27.9 pg (ref 26.6–33.0)
MCHC: 32.6 g/dL (ref 31.5–35.7)
MCV: 86 fL (ref 79–97)
Monocytes Absolute: 0.3 10*3/uL (ref 0.1–0.9)
Monocytes: 7 %
Neutrophils Absolute: 1.6 10*3/uL (ref 1.4–7.0)
Neutrophils: 41 %
Platelets: 313 10*3/uL (ref 150–450)
RBC: 5.06 x10E6/uL (ref 3.77–5.28)
RDW: 13.8 % (ref 11.7–15.4)
WBC: 4 10*3/uL (ref 3.4–10.8)

## 2021-12-15 LAB — HEMOGLOBIN A1C
Est. average glucose Bld gHb Est-mCnc: 123 mg/dL
Hgb A1c MFr Bld: 5.9 % — ABNORMAL HIGH (ref 4.8–5.6)

## 2021-12-15 LAB — CMP14+EGFR
ALT: 13 IU/L (ref 0–32)
AST: 13 IU/L (ref 0–40)
Albumin/Globulin Ratio: 1.6 (ref 1.2–2.2)
Albumin: 4.4 g/dL (ref 3.8–4.9)
Alkaline Phosphatase: 83 IU/L (ref 44–121)
BUN/Creatinine Ratio: 12 (ref 9–23)
BUN: 12 mg/dL (ref 6–24)
Bilirubin Total: 0.7 mg/dL (ref 0.0–1.2)
CO2: 23 mmol/L (ref 20–29)
Calcium: 9 mg/dL (ref 8.7–10.2)
Chloride: 102 mmol/L (ref 96–106)
Creatinine, Ser: 0.98 mg/dL (ref 0.57–1.00)
Globulin, Total: 2.7 g/dL (ref 1.5–4.5)
Glucose: 100 mg/dL — ABNORMAL HIGH (ref 70–99)
Potassium: 4.3 mmol/L (ref 3.5–5.2)
Sodium: 140 mmol/L (ref 134–144)
Total Protein: 7.1 g/dL (ref 6.0–8.5)
eGFR: 68 mL/min/{1.73_m2} (ref >59)

## 2021-12-15 LAB — VITAMIN B12: Vitamin B-12: 698 pg/mL (ref 232–1245)

## 2021-12-15 LAB — VITAMIN D 25 HYDROXY (VIT D DEFICIENCY, FRACTURES): Vit D, 25-Hydroxy: 47.3 ng/mL (ref 30.0–100.0)

## 2021-12-15 LAB — INSULIN, RANDOM: INSULIN: 11.4 u[IU]/mL (ref 2.6–24.9)

## 2021-12-28 NOTE — Progress Notes (Signed)
Chief Complaint:   OBESITY Mary Odonnell is here to discuss her progress with her obesity treatment plan along with follow-up of her obesity related diagnoses. Mary Odonnell is on the Category 1 Plan and keeping a food journal and adhering to recommended goals of 1000 calories and 100 grams of protein and states she is following her eating plan approximately 40% of the time. Mary Odonnell states she is walking 20 minutes 3 times per week.  Today's visit was #: 28 Starting weight: 225 lbs Starting date: 07/09/2021 Today's weight: 205 lbs Today's date: 12/14/2021 Total lbs lost to date: 20 lbs Total lbs lost since last in-office visit: 2  Interim History: Mary Odonnell has been doing well with weight loss.  Doing better not skipping meals, working on getting in adequate protein.  Did well over Thanksgiving with portion control/smart choices.  Has not been able to obtain [facility] yet due to supply issues.  She is fasting for labs, but did not come in time to complete IC today. She would like to do IC at next visit.  Subjective:   1. Vitamin D deficiency Mary Odonnell is currently taking prescription Vit D 50,000 IU once a week. Level of 54.7 on 06/18/21- At goal.  2. Prediabetes Mary Odonnell is taking Metformin 750 mg daily--Denies any side effects. Unable to obtain GLP-1 [facility]) due to supply issues.Last A1C 6.4/Insulin 16.5 on 06/18/21- Not at goal.   3. Stress reaction with emotional eating Mary Odonnell is taking Wellbutrin SR 200 mg daily--Feels helps with cravings. Denies any side effects.  Assessment/Plan:   1. Vitamin D deficiency We will obtain labs today. We will refill Vit D 50K IU once a week for 1 month with 0 refills.  -Refill Vitamin D, Ergocalciferol, (DRISDOL) 1.25 MG (50000 UNIT) CAPS capsule; Take 1 capsule (50,000 Units total) by mouth every 7 (seven) days.  Dispense: 4 capsule; Refill: 0  - VITAMIN D 25 Hydroxy (Vit-D Deficiency, Fractures)  2. Prediabetes We will obtain labs today. Continue healthy eating  plan and exercise.  - Vitamin B12 - CBC with Differential/Platelet - CMP14+EGFR - Hemoglobin A1c - Insulin, random - Lipid Panel With LDL/HDL Ratio  3. Stress reaction with emotional eating We will refill Wellbutrin SR 200 mg daily for 3 months with 0 refills. Begin GLP-1 if able to obtain.  -Refill buPROPion (WELLBUTRIN SR) 200 MG 12 hr tablet; Take 1 tablet (200 mg total) by mouth in the morning.  Dispense: 90 tablet; Refill: 0  4. Obesity, Current BMI 43.0 Mary Odonnell is currently in the action stage of change. As such, her goal is to continue with weight loss efforts. She has agreed to the Category 1 Plan and keeping a food journal and adhering to recommended goals of 1000 calories and 100 grams of  protein daily.   Exercise goals: As is.+ Adding ankle weights to walking.  Behavioral modification strategies: increasing lean protein intake, no skipping meals, meal planning and cooking strategies, and holiday eating strategies .  Mary Odonnell has agreed to follow-up with our clinic in 3 weeks. She was informed of the importance of frequent follow-up visits to maximize her success with intensive lifestyle modifications for her multiple health conditions.   Mary Odonnell was informed we would discuss her lab results at her next visit unless there is a critical issue that needs to be addressed sooner. Mary Odonnell agreed to keep her next visit at the agreed upon time to discuss these results.  Objective:   Blood pressure (!) 133/92, pulse 86, temperature 98.1 F (36.7 C),  height _0  (1.473 m), weight 205 lb (93 kg), last menstrual period 01/24/2016, SpO2 97 %. Body mass index is 42.85 kg/m.  General: Cooperative, alert, well developed, in no acute distress. HEENT: Conjunctivae and lids unremarkable. Cardiovascular: Regular rhythm.  Lungs: Normal work of breathing. Neurologic: No focal deficits.   Lab Results  Component Value Date   CREATININE 0.98 12/14/2021   BUN 12 12/14/2021   NA 140  12/14/2021   K 4.3 12/14/2021   CL 102 12/14/2021   CO2 23 12/14/2021   Lab Results  Component Value Date   ALT 13 12/14/2021   AST 13 12/14/2021   ALKPHOS 83 12/14/2021   BILITOT 0.7 12/14/2021   Lab Results  Component Value Date   HGBA1C 5.9 (H) 12/14/2021   HGBA1C 6.4 (H) 06/18/2021   HGBA1C 6.0 (H) 03/05/2021   HGBA1C 6.0 09/29/2020   HGBA1C 6.3 (H) 06/12/2020   Lab Results  Component Value Date   INSULIN 11.4 12/14/2021   INSULIN 16.5 06/18/2021   INSULIN 13.9 03/05/2021   INSULIN 13.2 06/12/2020   Lab Results  Component Value Date   TSH 1.300 06/18/2021   Lab Results  Component Value Date   CHOL 172 12/14/2021   HDL 65 12/14/2021   LDLCALC 94 12/14/2021   TRIG 65 12/14/2021   CHOLHDL 2.4 06/18/2021   Lab Results  Component Value Date   VD25OH 47.3 12/14/2021   VD25OH 54.7 06/18/2021   VD25OH 47.9 03/05/2021   Lab Results  Component Value Date   WBC 4.0 12/14/2021   HGB 14.1 12/14/2021   HCT 43.3 12/14/2021   MCV 86 12/14/2021   PLT 313 12/14/2021   No results found for: "IRON", "TIBC", "FERRITIN"  Attestation Statements:   Reviewed by clinician on day of visit: allergies, medications, problem list, medical history, surgical history, family history, social history, and previous encounter notes.  I, Brendell Tyus, am acting as transcriptionist for [employer], PA.  I have reviewed the above documentation for accuracy and completeness, and I agree with the above. -  Dimas Scheck,PA-C

## 2021-12-29 ENCOUNTER — Other Ambulatory Visit (HOSPITAL_COMMUNITY): Payer: Self-pay

## 2021-12-30 ENCOUNTER — Other Ambulatory Visit (INDEPENDENT_AMBULATORY_CARE_PROVIDER_SITE_OTHER): Payer: Self-pay | Admitting: Physician Assistant

## 2021-12-30 DIAGNOSIS — E559 Vitamin D deficiency, unspecified: Secondary | ICD-10-CM

## 2022-01-14 ENCOUNTER — Encounter (INDEPENDENT_AMBULATORY_CARE_PROVIDER_SITE_OTHER): Payer: Self-pay | Admitting: Family Medicine

## 2022-01-14 ENCOUNTER — Other Ambulatory Visit (HOSPITAL_COMMUNITY): Payer: Self-pay

## 2022-01-14 ENCOUNTER — Ambulatory Visit (INDEPENDENT_AMBULATORY_CARE_PROVIDER_SITE_OTHER): Payer: Managed Care, Other (non HMO) | Admitting: Family Medicine

## 2022-01-14 VITALS — BP 135/84 | HR 84 | Temp 98.6°F | Ht <= 58 in | Wt 208.8 lb

## 2022-01-14 DIAGNOSIS — Z6841 Body Mass Index (BMI) 40.0 and over, adult: Secondary | ICD-10-CM

## 2022-01-14 DIAGNOSIS — E559 Vitamin D deficiency, unspecified: Secondary | ICD-10-CM

## 2022-01-14 DIAGNOSIS — I10 Essential (primary) hypertension: Secondary | ICD-10-CM | POA: Diagnosis not present

## 2022-01-14 DIAGNOSIS — R7303 Prediabetes: Secondary | ICD-10-CM | POA: Diagnosis not present

## 2022-01-14 DIAGNOSIS — E669 Obesity, unspecified: Secondary | ICD-10-CM

## 2022-01-14 MED ORDER — SEMAGLUTIDE-WEIGHT MANAGEMENT 0.25 MG/0.5ML ~~LOC~~ SOAJ
0.2500 mg | SUBCUTANEOUS | 0 refills | Status: DC
  Filled 2022-01-14: qty 2, 28d supply, fill #0

## 2022-01-14 MED ORDER — VITAMIN D (ERGOCALCIFEROL) 1.25 MG (50000 UNIT) PO CAPS
50000.0000 [IU] | ORAL_CAPSULE | ORAL | 0 refills | Status: DC
  Filled 2022-01-14: qty 4, 28d supply, fill #0

## 2022-01-29 NOTE — Progress Notes (Signed)
Chief Complaint:   OBESITY Mary Odonnell is here to discuss her progress with her obesity treatment plan along with follow-up of her obesity related diagnoses. Mary Odonnell is on the Category 1 Plan and keeping a food journal and adhering to recommended goals of 1000 calories and 100 protein and states she is following her eating plan approximately 50% of the time. Mary Odonnell states she is walking 15-20 minutes 4 times per week.  Today's visit was #: 87 Starting weight: 225 LBS Starting date: 06/12/2020 Today's weight: 208 LBS Today's date: 01/14/2022 Total lbs lost to date: 17 LBS Total lbs lost since last in-office visit: +3 LBS  Interim History: Considering she went to the beach on vacation and also had the holidays, patient is happy with only 3 pounds of weight gain.  She is here today to review labs.  Extensive education provided regarding lab results, dietary, lifestyle effects.  Subjective:   1. Prediabetes Discussed labs with patient today. Fasting insulin improving and A1c went from 6.4 to to 5.9, excellent.  B12 also within normal limits.  2. Essential hypertension Discussed labs with patient today. CMP within normal limits.  Patient is asymptomatic's no concerns.  Patient is taking Norvasc, Cozaar and Lasix.  Fasting lipid panel within normal limits.  3. Vitamin D deficiency Discussed labs with patient today. Mary Odonnell is tolerating medication(s) well without side effects.  Medication compliance is good as patient endorses taking it as prescribed.  Symptoms are stable and the patient denies additional concerns regarding this condition.  Vitamin D at goal.  Assessment/Plan:  No orders of the defined types were placed in this encounter.   Medications Discontinued During This Encounter  Medication Reason   Semaglutide-Weight Management 0.25 MG/0.5ML SOAJ Reorder   Vitamin D, Ergocalciferol, (DRISDOL) 1.25 MG (50000 UNIT) CAPS capsule Reorder     Meds ordered this encounter   Medications   DISCONTD: Semaglutide-Weight Management 0.25 MG/0.5ML SOAJ    Sig: Inject 0.25 mg into the skin once a week. Every Thursday    Dispense:  2 mL    Refill:  0   DISCONTD: Vitamin D, Ergocalciferol, (DRISDOL) 1.25 MG (50000 UNIT) CAPS capsule    Sig: Take 1 capsule (50,000 Units total) by mouth every 7 (seven) days.    Dispense:  4 capsule    Refill:  0    30 d supply; OV for RF     1. Prediabetes Start Wegovy 0.25 mg weekly.  Extensive counseling done on labs, dietary changes and disease process.  Continue PNP and weight loss.  Continue metformin.  Will monitor.  2. Essential hypertension Blood pressure has improved from prior, CMP within normal limits.  Continue decrease salt intake and continue medications as directed, along with weight loss.  3. Vitamin D deficiency Refill- Vitamin D, Ergocalciferol, (DRISDOL) 1.25 MG (50000 UNIT) CAPS capsule; Take 1 capsule (50,000 Units total) by mouth every 7 (seven) days.  Dispense: 4 capsule; Refill: 0  4. Obesity, Current BMI 43.6  To get the [facility] and intake due to but has not started it yet.  Risk and benefits of medication discussed with patient again.  Refill given at 0.25 mg dose of Wegovy.  Continue PNP.  Refill- Semaglutide-Weight Management 0.25 MG/0.5ML SOAJ; Inject 0.25 mg into the skin once a week. Every Thursday  Dispense: 2 mL; Refill: 0  Mary Odonnell is currently in the action stage of change. As such, her goal is to continue with weight loss efforts. She has agreed to the  Category 1 Plan and keeping a food journal and adhering to recommended goals of 1000 calories and 90 protein with breakfast and lunch options daily.   Exercise goals:  As is.  Behavioral modification strategies: planning for success.  Mary Odonnell has agreed to follow-up with our clinic in 3 weeks. She was informed of the importance of frequent follow-up visits to maximize her success with intensive lifestyle modifications for her multiple health  conditions.   Objective:   Blood pressure 135/84, pulse 84, temperature 98.6 F (37 C), height '4\' 10"'$  (1.473 m), weight 208 lb 12.8 oz (94.7 kg), last menstrual period 01/24/2016, SpO2 97 %. Body mass index is 43.64 kg/m.  General: Cooperative, alert, well developed, in no acute distress. HEENT: Conjunctivae and lids unremarkable. Cardiovascular: Regular rhythm.  Lungs: Normal work of breathing. Neurologic: No focal deficits.   Lab Results  Component Value Date   CREATININE 0.98 12/14/2021   BUN 12 12/14/2021   NA 140 12/14/2021   K 4.3 12/14/2021   CL 102 12/14/2021   CO2 23 12/14/2021   Lab Results  Component Value Date   ALT 13 12/14/2021   AST 13 12/14/2021   ALKPHOS 83 12/14/2021   BILITOT 0.7 12/14/2021   Lab Results  Component Value Date   HGBA1C 5.9 (H) 12/14/2021   HGBA1C 6.4 (H) 06/18/2021   HGBA1C 6.0 (H) 03/05/2021   HGBA1C 6.0 09/29/2020   HGBA1C 6.3 (H) 06/12/2020   Lab Results  Component Value Date   INSULIN 11.4 12/14/2021   INSULIN 16.5 06/18/2021   INSULIN 13.9 03/05/2021   INSULIN 13.2 06/12/2020   Lab Results  Component Value Date   TSH 1.300 06/18/2021   Lab Results  Component Value Date   CHOL 172 12/14/2021   HDL 65 12/14/2021   LDLCALC 94 12/14/2021   TRIG 65 12/14/2021   CHOLHDL 2.4 06/18/2021   Lab Results  Component Value Date   VD25OH 47.3 12/14/2021   VD25OH 54.7 06/18/2021   VD25OH 47.9 03/05/2021   Lab Results  Component Value Date   WBC 4.0 12/14/2021   HGB 14.1 12/14/2021   HCT 43.3 12/14/2021   MCV 86 12/14/2021   PLT 313 12/14/2021   No results found for: "IRON", "TIBC", "FERRITIN"  Attestation Statements:   Reviewed by clinician on day of visit: allergies, medications, problem list, medical history, surgical history, family history, social history, and previous encounter notes.  Time spent on visit including pre-visit chart review and post-visit care and charting was 40 minutes.   I, Davy Pique, RMA,  am acting as Location manager for [employer], DO.   I have reviewed the above documentation for accuracy and completeness, and I agree with the above. Marjory Sneddon, D.O.  The [place] was signed into law in 2016 which includes the topic of electronic health records.  This provides immediate access to information in MyChart.  This includes consultation notes, operative notes, office notes, lab results and pathology reports.  If you have any questions about what you read please let us know at your next visit so we can discuss your concerns and take corrective action if need be.  We are right here with you.

## 2022-02-03 ENCOUNTER — Encounter (INDEPENDENT_AMBULATORY_CARE_PROVIDER_SITE_OTHER): Payer: Self-pay | Admitting: Family Medicine

## 2022-02-03 ENCOUNTER — Other Ambulatory Visit (HOSPITAL_COMMUNITY): Payer: Self-pay

## 2022-02-03 ENCOUNTER — Ambulatory Visit (INDEPENDENT_AMBULATORY_CARE_PROVIDER_SITE_OTHER): Payer: Managed Care, Other (non HMO) | Admitting: Family Medicine

## 2022-02-03 VITALS — BP 124/81 | HR 83 | Temp 98.0°F | Ht <= 58 in | Wt 206.0 lb

## 2022-02-03 DIAGNOSIS — E669 Obesity, unspecified: Secondary | ICD-10-CM | POA: Diagnosis not present

## 2022-02-03 DIAGNOSIS — Z6841 Body Mass Index (BMI) 40.0 and over, adult: Secondary | ICD-10-CM | POA: Diagnosis not present

## 2022-02-03 DIAGNOSIS — F439 Reaction to severe stress, unspecified: Secondary | ICD-10-CM | POA: Insufficient documentation

## 2022-02-03 DIAGNOSIS — E559 Vitamin D deficiency, unspecified: Secondary | ICD-10-CM | POA: Diagnosis not present

## 2022-02-03 MED ORDER — SEMAGLUTIDE-WEIGHT MANAGEMENT 0.25 MG/0.5ML ~~LOC~~ SOAJ
0.2500 mg | SUBCUTANEOUS | 0 refills | Status: DC
  Filled 2022-02-03: qty 2, 28d supply, fill #0

## 2022-02-03 MED ORDER — VITAMIN D (ERGOCALCIFEROL) 1.25 MG (50000 UNIT) PO CAPS
50000.0000 [IU] | ORAL_CAPSULE | ORAL | 0 refills | Status: DC
  Filled 2022-02-03: qty 4, 28d supply, fill #0

## 2022-02-04 ENCOUNTER — Ambulatory Visit (INDEPENDENT_AMBULATORY_CARE_PROVIDER_SITE_OTHER): Payer: Managed Care, Other (non HMO) | Admitting: Family Medicine

## 2022-02-14 ENCOUNTER — Other Ambulatory Visit (INDEPENDENT_AMBULATORY_CARE_PROVIDER_SITE_OTHER): Payer: Self-pay | Admitting: Physician Assistant

## 2022-02-14 DIAGNOSIS — F430 Acute stress reaction: Secondary | ICD-10-CM

## 2022-02-17 NOTE — Progress Notes (Signed)
Chief Complaint:   OBESITY Mary Odonnell is here to discuss her progress with her obesity treatment plan along with follow-up of her obesity related diagnoses. Mary Odonnell is on the Category 1 Plan and keeping a food journal and adhering to recommended goals of 1000 calories and 90 grams of protein and states she is following her eating plan approximately 50% of the time. Mary Odonnell states she is walking for 30 minutes 2 times per week.  Today's visit was #: 30 Starting weight: 225 lbs Starting date: 06/12/2020 Today's weight: 206 lbs Today's date: 02/03/2022 Total lbs lost to date: 19 Total lbs lost since last in-office visit: 2  Interim History: Mary Odonnell is doing well with weight loss. She recently started [facility] and she is doing well, and she is working on increasing protein and trying to be mindful of snacking. She is decreasing sweets in her home.   Subjective:   1. Vitamin D deficiency Mary Odonnell's recent Vitamin D level is improving, but not yet at goal. She denies nausea, vomiting, or muscle weakness. I discussed labs with the patient today.   2. Stress Mary Odonnell notes increased stress especially with her work. She is mindful of the fact that this contributes to emotional eating behaviors, and she is working on more stress relief and self-care.   Assessment/Plan:   1. Vitamin D deficiency We will refill prescription Vitamin D for 1 month. Mary Odonnell will follow-up for routine testing of Vitamin D, at least 2-3 times per year to avoid over-replacement.  - Vitamin D, Ergocalciferol, (DRISDOL) 1.25 MG (50000 UNIT) CAPS capsule; Take 1 capsule (50,000 Units total) by mouth every 7 (seven) days.  Dispense: 4 capsule; Refill: 0  2. 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   3. BMI 40.0-44.9, adult (HCC)  4. Obesity, Beginning BMI 47  5. Obesity, Current BMI 43.1 Mary Odonnell agreed to increase Wegovy  to 0.5 mg once weekly with no refills.   - Semaglutide-Weight Management 0.25 MG/0.5ML SOAJ; Inject 0.25 mg into the skin once a week. (Every Thursday)  Dispense: 2 mL; Refill: 0  Mary Odonnell is currently in the action stage of change. As such, her goal is to continue with weight loss efforts. She has agreed to keeping a food journal and adhering to recommended goals of 1000 calories and 90+ grams of protein daily.   Exercise goals: As is.   Behavioral modification strategies: emotional eating strategies.  Mary Odonnell has agreed to follow-up with our clinic in 3 weeks. She was informed of the importance of frequent follow-up visits to maximize her success with intensive lifestyle modifications for her multiple health conditions.   Objective:   Blood pressure 124/81, pulse 83, temperature 98 F (36.7 C), height '4\' 10"'$  (1.473 m), weight 206 lb (93.4 kg), last menstrual period 01/24/2016, SpO2 96 %. Body mass index is 43.05 kg/m.  General: Cooperative, alert, well developed, in no acute distress. HEENT: Conjunctivae and lids unremarkable. Cardiovascular: Regular rhythm.  Lungs: Normal work of breathing. Neurologic: No focal deficits.   Lab Results  Component Value Date   CREATININE 0.98 12/14/2021   BUN 12 12/14/2021   NA 140 12/14/2021   K 4.3 12/14/2021   CL 102 12/14/2021   CO2 23 12/14/2021   Lab Results  Component Value Date   ALT 13 12/14/2021   AST 13 12/14/2021   ALKPHOS 83 12/14/2021   BILITOT 0.7 12/14/2021  Lab Results  Component Value Date   HGBA1C 5.9 (H) 12/14/2021   HGBA1C 6.4 (H) 06/18/2021   HGBA1C 6.0 (H) 03/05/2021   HGBA1C 6.0 09/29/2020   HGBA1C 6.3 (H) 06/12/2020   Lab Results  Component Value Date   INSULIN 11.4 12/14/2021   INSULIN 16.5 06/18/2021   INSULIN 13.9 03/05/2021   INSULIN 13.2 06/12/2020   Lab Results  Component Value Date   TSH 1.300 06/18/2021   Lab Results  Component Value Date   CHOL 172 12/14/2021   HDL 65 12/14/2021    LDLCALC 94 12/14/2021   TRIG 65 12/14/2021   CHOLHDL 2.4 06/18/2021   Lab Results  Component Value Date   VD25OH 47.3 12/14/2021   VD25OH 54.7 06/18/2021   VD25OH 47.9 03/05/2021   Lab Results  Component Value Date   WBC 4.0 12/14/2021   HGB 14.1 12/14/2021   HCT 43.3 12/14/2021   MCV 86 12/14/2021   PLT 313 12/14/2021   No results found for: "IRON", "TIBC", "FERRITIN"  Attestation Statements:   Reviewed by clinician on day of visit: allergies, medications, problem list, medical history, surgical history, family history, social history, and previous encounter notes.   I, Trixie Dredge, am acting as transcriptionist for Dennard Nip, MD.  I have reviewed the above documentation for accuracy and completeness, and I agree with the above. -  Dennard Nip, MD

## 2022-02-22 ENCOUNTER — Other Ambulatory Visit (HOSPITAL_COMMUNITY): Payer: Self-pay

## 2022-02-23 ENCOUNTER — Other Ambulatory Visit (HOSPITAL_COMMUNITY): Payer: Self-pay

## 2022-02-23 ENCOUNTER — Other Ambulatory Visit: Payer: Self-pay

## 2022-02-25 ENCOUNTER — Ambulatory Visit (INDEPENDENT_AMBULATORY_CARE_PROVIDER_SITE_OTHER): Payer: Managed Care, Other (non HMO) | Admitting: Family Medicine

## 2022-02-25 ENCOUNTER — Other Ambulatory Visit (HOSPITAL_COMMUNITY): Payer: Self-pay

## 2022-02-25 ENCOUNTER — Encounter (INDEPENDENT_AMBULATORY_CARE_PROVIDER_SITE_OTHER): Payer: Self-pay | Admitting: Family Medicine

## 2022-02-25 VITALS — BP 136/92 | HR 83 | Temp 98.6°F | Ht <= 58 in | Wt 206.4 lb

## 2022-02-25 DIAGNOSIS — Z6841 Body Mass Index (BMI) 40.0 and over, adult: Secondary | ICD-10-CM | POA: Diagnosis not present

## 2022-02-25 DIAGNOSIS — F430 Acute stress reaction: Secondary | ICD-10-CM

## 2022-02-25 MED ORDER — WEGOVY 0.5 MG/0.5ML ~~LOC~~ SOAJ
0.5000 mg | SUBCUTANEOUS | 0 refills | Status: DC
  Filled 2022-02-25: qty 2, 28d supply, fill #0

## 2022-03-03 ENCOUNTER — Telehealth (INDEPENDENT_AMBULATORY_CARE_PROVIDER_SITE_OTHER): Payer: Self-pay

## 2022-03-03 NOTE — Telephone Encounter (Signed)
Prior auth submitted thru covermymeds. (KeyHC:[id number] 0.25MG/0.5ML auto-injectors

## 2022-03-08 ENCOUNTER — Other Ambulatory Visit (HOSPITAL_COMMUNITY): Payer: Self-pay

## 2022-03-10 NOTE — Progress Notes (Signed)
Chief Complaint:   OBESITY Mary Odonnell is here to discuss her progress with her obesity treatment plan along with follow-up of her obesity related diagnoses. Mary Odonnell is on keeping a food journal and adhering to recommended goals of 1000 calories and 90 protein and states she is following her eating plan approximately 50% of the time. Mary Odonnell states she is walking and boxing 15 minutes 3 times per week.  Today's visit was #: 36 Starting weight: 225 LBS Starting date: 06/12/2020 Today's weight: 206 LBS Today's date: 02/25/2022 Total lbs lost to date: 19 LBS Total lbs lost since last in-office visit: 0  Interim History: Patient states that she has been doing well because she got her Wegovy today.  She has been out for 2 weeks.  She started to [facility] early January and just got the second prescription.  She is not sure when the more aware of what she eats.  She is also plan for 50% of the time.  Subjective:   1. Stress reaction with emotional eating Patient has been stable, mood is good sleeping well.  Assessment/Plan:  No orders of the defined types were placed in this encounter.   Medications Discontinued During This Encounter  Medication Reason   Semaglutide-Weight Management 0.25 MG/0.5ML SOAJ      Meds ordered this encounter  Medications   Semaglutide-Weight Management (WEGOVY) 0.5 MG/0.5ML SOAJ    Sig: Inject 0.5 mg into the skin once a week. Q thursday    Dispense:  2 mL    Refill:  0     1. Stress reaction with emotional eating Continue Wellbutrin and continue mindful practices.  2. BMI 40.0-44.9, adult (HCC)-CURRENT BMI 43.1 Refill/increase- Semaglutide-Weight Management (WEGOVY) 0.5 MG/0.5ML SOAJ; Inject 0.5 mg into the skin once a week. Q thursday  Dispense: 2 mL; Refill: 0  3. Morbid obesity (HCC)-START BMI 47.03 1.  Bring in journaling total with next office visit, fill out log. 2.  Increase exercise to 20 minutes 5 days/week, walking outside. 3.  Increase dose and  refill Wegovy to 0.5 mg weekly every Thursday.  Increase- Semaglutide-Weight Management (WEGOVY) 0.5 MG/0.5ML SOAJ; Inject 0.5 mg into the skin once a week. Q thursday  Dispense: 2 mL; Refill: 0  Mary Odonnell is currently in the action stage of change. As such, her goal is to continue with weight loss efforts. She has agreed to keeping a food journal and adhering to recommended goals of 1000-1050 calories and 90+ protein daily.   Exercise goals:  20 minutes of exercise 5 days/week.  Behavioral modification strategies: increasing lean protein intake, decreasing simple carbohydrates, and keeping a strict food journal.  Mary Odonnell has agreed to follow-up with our clinic in 3 weeks. She was informed of the importance of frequent follow-up visits to maximize her success with intensive lifestyle modifications for her multiple health conditions.  You said you are coughing when you bring it months Objective:   Blood pressure (!) 136/92, pulse 83, temperature 98.6 F (37 C), height '4\' 10"'$  (1.473 m), weight 206 lb 6.4 oz (93.6 kg), last menstrual period 01/24/2016, SpO2 99 %. Body mass index is 43.14 kg/m.  General: Cooperative, alert, well developed, in no acute distress. HEENT: Conjunctivae and lids unremarkable. Cardiovascular: Regular rhythm.  Lungs: Normal work of breathing. Neurologic: No focal deficits.   Lab Results  Component Value Date   CREATININE 0.98 12/14/2021   BUN 12 12/14/2021   NA 140 12/14/2021   K 4.3 12/14/2021   CL 102 12/14/2021  CO2 23 12/14/2021   Lab Results  Component Value Date   ALT 13 12/14/2021   AST 13 12/14/2021   ALKPHOS 83 12/14/2021   BILITOT 0.7 12/14/2021   Lab Results  Component Value Date   HGBA1C 5.9 (H) 12/14/2021   HGBA1C 6.4 (H) 06/18/2021   HGBA1C 6.0 (H) 03/05/2021   HGBA1C 6.0 09/29/2020   HGBA1C 6.3 (H) 06/12/2020   Lab Results  Component Value Date   INSULIN 11.4 12/14/2021   INSULIN 16.5 06/18/2021   INSULIN 13.9 03/05/2021    INSULIN 13.2 06/12/2020   Lab Results  Component Value Date   TSH 1.300 06/18/2021   Lab Results  Component Value Date   CHOL 172 12/14/2021   HDL 65 12/14/2021   LDLCALC 94 12/14/2021   TRIG 65 12/14/2021   CHOLHDL 2.4 06/18/2021   Lab Results  Component Value Date   VD25OH 47.3 12/14/2021   VD25OH 54.7 06/18/2021   VD25OH 47.9 03/05/2021   Lab Results  Component Value Date   WBC 4.0 12/14/2021   HGB 14.1 12/14/2021   HCT 43.3 12/14/2021   MCV 86 12/14/2021   PLT 313 12/14/2021   No results found for: "IRON", "TIBC", "FERRITIN"  is a: Hemoccult WhatAttestation Statements:   Reviewed by clinician on day of visit: allergies, medications, problem list, medical history, surgical history, family history, social history, and previous encounter notes.  I, Davy Pique, RMA, am acting as Location manager for [employer], DO.  I have reviewed the above documentation for accuracy and completeness, and I agree with the above. Marjory Sneddon, D.O.  [place] into law in 2016 which includes the topic of electronic health records.  This provides immediate access to information in MyChart.  This includes consultation notes, operative notes, office notes, lab results and pathology reports.  If you have any questions about what you read please let us know at your next visit so we can discuss your concerns and take corrective action if need be.  We are right here with you.

## 2022-03-11 ENCOUNTER — Other Ambulatory Visit: Payer: Self-pay

## 2022-03-15 ENCOUNTER — Telehealth (INDEPENDENT_AMBULATORY_CARE_PROVIDER_SITE_OTHER): Payer: Self-pay

## 2022-03-15 ENCOUNTER — Other Ambulatory Visit (HOSPITAL_COMMUNITY): Payer: Self-pay

## 2022-03-15 NOTE — Telephone Encounter (Signed)
Prior auth submitted thru covermymeds. (KeyScherrie [id number] 0.'5MG'$ /0.5ML auto-injectors

## 2022-03-18 ENCOUNTER — Ambulatory Visit (INDEPENDENT_AMBULATORY_CARE_PROVIDER_SITE_OTHER): Payer: Managed Care, Other (non HMO) | Admitting: Family Medicine

## 2022-03-18 ENCOUNTER — Encounter (INDEPENDENT_AMBULATORY_CARE_PROVIDER_SITE_OTHER): Payer: Self-pay | Admitting: Physician Assistant

## 2022-03-18 ENCOUNTER — Other Ambulatory Visit (HOSPITAL_COMMUNITY): Payer: Self-pay

## 2022-03-18 ENCOUNTER — Ambulatory Visit (INDEPENDENT_AMBULATORY_CARE_PROVIDER_SITE_OTHER): Payer: Managed Care, Other (non HMO) | Admitting: Physician Assistant

## 2022-03-18 VITALS — BP 115/80 | HR 77 | Temp 97.9°F | Ht <= 58 in | Wt 202.0 lb

## 2022-03-18 DIAGNOSIS — F430 Acute stress reaction: Secondary | ICD-10-CM | POA: Diagnosis not present

## 2022-03-18 DIAGNOSIS — R7303 Prediabetes: Secondary | ICD-10-CM

## 2022-03-18 DIAGNOSIS — E669 Obesity, unspecified: Secondary | ICD-10-CM | POA: Diagnosis not present

## 2022-03-18 DIAGNOSIS — Z6841 Body Mass Index (BMI) 40.0 and over, adult: Secondary | ICD-10-CM

## 2022-03-18 DIAGNOSIS — E559 Vitamin D deficiency, unspecified: Secondary | ICD-10-CM

## 2022-03-18 MED ORDER — VITAMIN D (ERGOCALCIFEROL) 1.25 MG (50000 UNIT) PO CAPS
50000.0000 [IU] | ORAL_CAPSULE | ORAL | 0 refills | Status: DC
  Filled 2022-03-18 – 2022-04-05 (×3): qty 4, 28d supply, fill #0

## 2022-03-18 MED ORDER — BUPROPION HCL ER (SR) 200 MG PO TB12
200.0000 mg | ORAL_TABLET | Freq: Every morning | ORAL | 0 refills | Status: DC
  Filled 2022-03-18: qty 30, 30d supply, fill #0
  Filled 2022-04-05: qty 90, 90d supply, fill #0
  Filled 2022-04-05: qty 30, 30d supply, fill #0
  Filled 2022-05-03 – 2022-05-04 (×2): qty 30, 30d supply, fill #1

## 2022-03-18 MED ORDER — WEGOVY 0.5 MG/0.5ML ~~LOC~~ SOAJ
0.5000 mg | SUBCUTANEOUS | 0 refills | Status: DC
  Filled 2022-03-18: qty 2, 28d supply, fill #0

## 2022-03-18 NOTE — Progress Notes (Signed)
Office: [phone]  /  Fax: [phone]  WEIGHT SUMMARY AND BIOMETRICS  Vitals Temp: 97.9 F (36.6 C) BP: 115/80 Pulse Rate: 77 SpO2: 99 %   Anthropometric Measurements Height: '4\' 10"'$  (1.473 m) Weight: 202 lb (91.6 kg) BMI (Calculated): 42.23 Weight at Last Visit: 206 lb Weight Lost Since Last Visit: 4 lb Starting Weight: 225 lb Total Weight Loss (lbs): 23 lb (10.4 kg)   Body Composition  Body Fat %: 46.2 % Fat Mass (lbs): 93.4 lbs Muscle Mass (lbs): 103 lbs Total Body Water (lbs): 73.8 lbs Visceral Fat Rating : 15   Other Clinical Data Fasting: no Labs: no Today's Visit #: 32 Starting Date: 06/12/20     HPI  Chief Complaint: OBESITY  Mary Odonnell is here to discuss her progress with her obesity treatment plan. She is on the the Category 1 Plan and states she is following her eating plan approximately 70 % of the time. She states she is exercising walking 20  minutes 5 times per week.   Interval History:  Since last office visit she has done very well with weight loss.  She has been much more mindful of choices and meeting protein goals.  She has also been moving more and started walking regularly. Appetite may be a little low at times/Hunger controlled. Not skipping meals.  Listened to a program on [place] about ways to control stress, learning to say "no" when needed and was very helpful.  She is moving daughter to [place] over the next few weeks and another daughter is graduating this year, so she is focusing on activities to keep busy, trying to read more, get out and be active.    Pharmacotherapy: Wegovy 0.5 mg weekly. No side effects.   Metformin XR 750 mg daily for prediabetes as primary indication.   PHYSICAL EXAM:  Blood pressure 115/80, pulse 77, temperature 97.9 F (36.6 C), height '4\' 10"'$  (1.473 m), weight 202 lb (91.6 kg), last menstrual period 01/24/2016, SpO2 99 %. Body mass index is 42.22 kg/m.  General: She is overweight, cooperative, alert,  well developed, and in no acute distress. PSYCH: Has normal mood, affect and thought process.   Lungs: Normal breathing effort, no conversational dyspnea.  DIAGNOSTIC DATA REVIEWED:  BMET    Component Value Date/Time   NA 140 12/14/2021 0838   K 4.3 12/14/2021 0838   CL 102 12/14/2021 0838   CO2 23 12/14/2021 0838   GLUCOSE 100 (H) 12/14/2021 0838   GLUCOSE 96 03/30/2016 1041   BUN 12 12/14/2021 0838   CREATININE 0.98 12/14/2021 0838   CALCIUM 9.0 12/14/2021 0838   GFRNONAA 96 08/21/2018 0931   GFRAA 110 08/21/2018 0931   Lab Results  Component Value Date   HGBA1C 5.9 (H) 12/14/2021   HGBA1C 6.1 (H) 10/11/2017   Lab Results  Component Value Date   INSULIN 11.4 12/14/2021   INSULIN 13.2 06/12/2020   Lab Results  Component Value Date   TSH 1.300 06/18/2021   CBC    Component Value Date/Time   WBC 4.0 12/14/2021 0838   RBC 5.06 12/14/2021 0838   RBC 4.95 09/29/2020 0000   HGB 14.1 12/14/2021 0838   HCT 43.3 12/14/2021 0838   PLT 313 12/14/2021 0838   MCV 86 12/14/2021 0838   MCH 27.9 12/14/2021 0838   MCHC 32.6 12/14/2021 0838   RDW 13.8 12/14/2021 0838   Iron Studies No results found for: "IRON", "TIBC", "FERRITIN", "IRONPCTSAT" Lipid Panel     Component Value Date/Time  CHOL 172 12/14/2021 0838   TRIG 65 12/14/2021 0838   HDL 65 12/14/2021 0838   CHOLHDL 2.4 06/18/2021 0936   LDLCALC 94 12/14/2021 0838   Hepatic Function Panel     Component Value Date/Time   PROT 7.1 12/14/2021 0838   ALBUMIN 4.4 12/14/2021 0838   AST 13 12/14/2021 0838   ALT 13 12/14/2021 0838   ALKPHOS 83 12/14/2021 0838   BILITOT 0.7 12/14/2021 0838      Component Value Date/Time   TSH 1.300 06/18/2021 0936   Nutritional Lab Results  Component Value Date   VD25OH 47.3 12/14/2021   VD25OH 54.7 06/18/2021   VD25OH 47.9 03/05/2021   ASSOCIATED CONDITIONS ADDRESSED TODAY  ASSESSMENT AND PLAN  1. Prediabetes Prediabetes Last A1c was 5.9  Medication(s): Wegovy 0.5  mg weekly and metformin XR 750 mg daily. No side effects with medications.   Lab Results  Component Value Date   HGBA1C 5.9 (H) 12/14/2021   HGBA1C 6.4 (H) 06/18/2021   HGBA1C 6.0 (H) 03/05/2021   HGBA1C 6.0 09/29/2020   HGBA1C 6.3 (H) 06/12/2020   Lab Results  Component Value Date   INSULIN 11.4 12/14/2021   INSULIN 16.5 06/18/2021   INSULIN 13.9 03/05/2021   INSULIN 13.2 06/12/2020    Plan: Continue working on nutrition plan to decrease simple carbohydrates, increase lean proteins and exercise to promote weight loss, improve glycemic control and prevent progression to Type 2 diabetes.   Continue and refill Wegovy 0.50 mg SQ weekly and continue metformin XR 750 mg daily.    2. Vitamin D deficiency Vitamin D Deficiency Vitamin D is not at goal of 50.  Most recent vitamin D level was 47.3. She is on  prescription ergocalciferol 50,000 IU weekly. Lab Results  Component Value Date   VD25OH 47.3 12/14/2021   VD25OH 54.7 06/18/2021   VD25OH 47.9 03/05/2021  Plan: Refill prescription vitamin D 50,000 IU weekly.   3. Stress reaction with emotional eating Mood disorder/emotional eating Mary Odonnell has had issues with stress/emotional eating. Currently this is well controlled. Overall mood is stable. Medication(s): Bupropion SR 200 mg daily  Plan: Continue and refill Bupropion SR 200 mg daily Continue to work on emotional eating strategies.   4. Obesity (HCC)-START BMI 47.03 5. BMI 40.0-44.9, adult (HCC)-CURRENT BMI 43.1 She has done very well with weight loss.  TBW loss of 10.22%  TREATMENT PLAN FOR OBESITY:  Recommended Dietary Goals  Mary Odonnell is currently in the action stage of change. As such, her goal is to continue weight management plan. She has agreed to the Category 1 Plan and keeping a food journal and adhering to recommended goals of 1000 calories and 90+grams of protein.  Behavioral Intervention  We discussed the following Behavioral Modification Strategies  today: increasing lean protein intake, decreasing simple carbohydrates , increasing vegetables, increasing water intake, and work on meal planning and easy cooking plans.  Additional resources provided today: NA  Recommended Physical Activity Goals  Mary Odonnell has been advised to work up to 150 minutes of moderate intensity aerobic activity a week and strengthening exercises 2-3 times per week for cardiovascular health, weight loss maintenance and preservation of muscle mass.   She has agreed to increase physical activity in their day and reduce sedentary time (increase NEAT).  and Will continue regular aerobic exercise 30 minutes, 3 times per week. Chosen activity walking.   Pharmacotherapy We discussed various medication options to help Mary Odonnell with her weight loss efforts and we both agreed to continue [facility] 0.5  mg weekly and metformin for primary indication of prediabetes.    Return in about 3 weeks (around 04/08/2022).Marland Kitchen She was informed of the importance of frequent follow up visits to maximize her success with intensive lifestyle modifications for her multiple health conditions.   ATTESTASTION STATEMENTS:  Reviewed by clinician on day of visit: allergies, medications, problem list, medical history, surgical history, family history, social history, and previous encounter notes.   I have personally spent 30+ minutes total time today in preparation, patient care, nutritional counseling and documentation for this visit, including the following: review of clinical lab tests; review of medical tests/procedures/services.      Adonte Vanriper, PA-C

## 2022-03-22 NOTE — Telephone Encounter (Signed)
New Key submitted. (Key: [id number] 0.'5MG'$ /0.5ML auto-injectors

## 2022-03-23 NOTE — Telephone Encounter (Signed)
SUMMARY OF PRIOR AUTHORIZATION DECISION: DENIED You do not meet the clinical requirements for this medication. REASON: This decision is based on your plan's drug coverage policy for this medication.  DRUG NAME: Wegovy Inj 0.'5mg'$   PATIENT INFO: Member ID: [id number] Case ID: [id number]

## 2022-04-05 ENCOUNTER — Other Ambulatory Visit (HOSPITAL_COMMUNITY): Payer: Self-pay

## 2022-04-08 ENCOUNTER — Ambulatory Visit (INDEPENDENT_AMBULATORY_CARE_PROVIDER_SITE_OTHER): Payer: Managed Care, Other (non HMO) | Admitting: Family Medicine

## 2022-04-15 ENCOUNTER — Other Ambulatory Visit (HOSPITAL_COMMUNITY): Payer: Self-pay

## 2022-04-15 ENCOUNTER — Encounter (INDEPENDENT_AMBULATORY_CARE_PROVIDER_SITE_OTHER): Payer: Self-pay | Admitting: Physician Assistant

## 2022-04-15 ENCOUNTER — Ambulatory Visit (INDEPENDENT_AMBULATORY_CARE_PROVIDER_SITE_OTHER): Payer: Managed Care, Other (non HMO) | Admitting: Physician Assistant

## 2022-04-15 VITALS — BP 137/95 | HR 81 | Temp 98.0°F | Ht <= 58 in | Wt 202.0 lb

## 2022-04-15 DIAGNOSIS — I10 Essential (primary) hypertension: Secondary | ICD-10-CM | POA: Diagnosis not present

## 2022-04-15 DIAGNOSIS — E559 Vitamin D deficiency, unspecified: Secondary | ICD-10-CM | POA: Diagnosis not present

## 2022-04-15 DIAGNOSIS — R7303 Prediabetes: Secondary | ICD-10-CM | POA: Diagnosis not present

## 2022-04-15 DIAGNOSIS — Z6841 Body Mass Index (BMI) 40.0 and over, adult: Secondary | ICD-10-CM

## 2022-04-15 DIAGNOSIS — F430 Acute stress reaction: Secondary | ICD-10-CM | POA: Diagnosis not present

## 2022-04-15 MED ORDER — VITAMIN D (ERGOCALCIFEROL) 1.25 MG (50000 UNIT) PO CAPS
50000.0000 [IU] | ORAL_CAPSULE | ORAL | 0 refills | Status: DC
  Filled 2022-04-15 – 2022-05-04 (×3): qty 4, 28d supply, fill #0

## 2022-04-15 NOTE — Assessment & Plan Note (Signed)
Vitamin D Deficiency Vitamin D is not at goal of 50.  Most recent vitamin D level was 47.3. She is on  prescription ergocalciferol 50,000 IU weekly. Lab Results  Component Value Date   VD25OH 47.3 12/14/2021   VD25OH 54.7 06/18/2021   VD25OH 47.9 03/05/2021    Plan: Continue and refill  prescription ergocalciferol 50,000 IU weekly Low vitamin D levels can be associated with adiposity and may result in leptin resistance and weight gain. Also associated with fatigue. Currently on vitamin D supplementation without any adverse effects.

## 2022-04-15 NOTE — Assessment & Plan Note (Signed)
Prediabetes Last A1c was 5.9 - improved/ Insulin 11.4 improved, but not at goal.  Medication(s):  Metformin XR 750 mg daily per PCP. No side effects with metformin She is working on nutrition plan to decrease simple carbohydrates, increase lean proteins and exercise to promote weight loss, improve glycemic control and prevent progression to Type 2 diabetes.   Lab Results  Component Value Date   HGBA1C 5.9 (H) 12/14/2021   HGBA1C 6.4 (H) 06/18/2021   HGBA1C 6.0 (H) 03/05/2021   HGBA1C 6.0 09/29/2020   HGBA1C 6.3 (H) 06/12/2020   Lab Results  Component Value Date   INSULIN 11.4 12/14/2021   INSULIN 16.5 06/18/2021   INSULIN 13.9 03/05/2021   INSULIN 13.2 06/12/2020    Plan: Continue  metformin XR 750 mg daily per PCP. Continue working on nutrition plan to decrease simple carbohydrates, increase lean proteins and exercise to promote weight loss, improve glycemic control and prevent progression to Type 2 diabetes.

## 2022-04-15 NOTE — Assessment & Plan Note (Signed)
Other depression/emotional eating Mairely has had issues with stress/emotional eating. Currently this is moderately controlled. Overall mood is stable. Medication(s): Bupropion SR 200 mg daily in am No side effects with bupropion, BP up a little lately. Monitor  Plan: Continue Bupropion SR 200 mg daily in am Monitor with some elevations in BP lately.  Continue to work on emotional eating strategies.

## 2022-04-15 NOTE — Progress Notes (Signed)
Office: [phone]  /  Fax: [phone]  WEIGHT SUMMARY AND BIOMETRICS  Vitals Temp: 51 F (36.7 C) BP: (!) 137/95 Pulse Rate: 81 SpO2: 98 %   Anthropometric Measurements Height: 4\' 10"  (1.473 m) Weight: 202 lb (91.6 kg) BMI (Calculated): 42.23 Weight at Last Visit: 202 lb Weight Lost Since Last Visit: 0 lb Weight Gained Since Last Visit: 0 lb Starting Weight: 225 lb Total Weight Loss (lbs): 23 lb (10.4 kg)   Body Composition  Body Fat %: 44.6 % Fat Mass (lbs): 90.2 lbs Muscle Mass (lbs): 106.6 lbs Total Body Water (lbs): 72 lbs Visceral Fat Rating : 14   Other Clinical Data Labs: no Today's Visit #: 60 Starting Date: 06/12/20     HPI  Chief Complaint: OBESITY  Mary Odonnell is here to discuss her progress with her obesity treatment plan. She is on the the Category 1 Plan and states she is following her eating plan approximately 40-45 % of the time. She states she is exercising/walking 20 minutes 5 times per week.   Interval History:  Since last office visit she has maintained her weight loss well since stopping GLP medication, Wegovy.   Bio impedence scale showed: Up 3.6 lbs in muscle mass, down 3.2 lbs adipose and , down 1.8 lbs water.  She has been exercising more consistently, walking at least 20 minutes 5 times weekly.  Work stress remains high. Increased stress with daughter moving to [place] and other daughter graduating this year, but feels this stress will improve in a couple more months.   Sleep- disrupted and we discussed journaling as strategy to help with sleep. She is also using white noise and this seems to help as well.    Pharmacotherapy: Mancel Parsons stopped due to no insurance coverage.    Metformin for prediabetes per PCP    Wellbutrin SR 200 mg daily for craving/emotional eating  PHYSICAL EXAM:  Blood pressure (!) 137/95, pulse 81, temperature 98 F (36.7 C), height 4\' 10"  (1.473 m), weight 202 lb (91.6 kg), last menstrual period 01/24/2016,  SpO2 98 %. Body mass index is 42.22 kg/m.  General: She is overweight, cooperative, alert, well developed, and in no acute distress. PSYCH: Has normal mood, affect and thought process.   Cardiovascular : regular rhythm Lungs: Normal breathing effort, no conversational dyspnea. Neuro: no focal deficits  DIAGNOSTIC DATA REVIEWED:  BMET    Component Value Date/Time   NA 140 12/14/2021 0838   K 4.3 12/14/2021 0838   CL 102 12/14/2021 0838   CO2 23 12/14/2021 0838   GLUCOSE 100 (H) 12/14/2021 0838   GLUCOSE 96 03/30/2016 1041   BUN 12 12/14/2021 0838   CREATININE 0.98 12/14/2021 0838   CALCIUM 9.0 12/14/2021 0838   GFRNONAA 96 08/21/2018 0931   GFRAA 110 08/21/2018 0931   Lab Results  Component Value Date   HGBA1C 5.9 (H) 12/14/2021   HGBA1C 6.1 (H) 10/11/2017   Lab Results  Component Value Date   INSULIN 11.4 12/14/2021   INSULIN 13.2 06/12/2020   Lab Results  Component Value Date   TSH 1.300 06/18/2021   CBC    Component Value Date/Time   WBC 4.0 12/14/2021 0838   RBC 5.06 12/14/2021 0838   RBC 4.95 09/29/2020 0000   HGB 14.1 12/14/2021 0838   HCT 43.3 12/14/2021 0838   PLT 313 12/14/2021 0838   MCV 86 12/14/2021 0838   MCH 27.9 12/14/2021 0838   MCHC 32.6 12/14/2021 0838   RDW 13.8 12/14/2021 0838  Iron Studies No results found for: "IRON", "TIBC", "FERRITIN", "IRONPCTSAT" Lipid Panel     Component Value Date/Time   CHOL 172 12/14/2021 0838   TRIG 65 12/14/2021 0838   HDL 65 12/14/2021 0838   CHOLHDL 2.4 06/18/2021 0936   LDLCALC 94 12/14/2021 0838   Hepatic Function Panel     Component Value Date/Time   PROT 7.1 12/14/2021 0838   ALBUMIN 4.4 12/14/2021 0838   AST 13 12/14/2021 0838   ALT 13 12/14/2021 0838   ALKPHOS 83 12/14/2021 0838   BILITOT 0.7 12/14/2021 0838      Component Value Date/Time   TSH 1.300 06/18/2021 0936   Nutritional Lab Results  Component Value Date   VD25OH 47.3 12/14/2021   VD25OH 54.7 06/18/2021   VD25OH 47.9  03/05/2021    ASSOCIATED CONDITIONS ADDRESSED TODAY  ASSESSMENT AND PLAN  Problem List Items Addressed This Visit     Essential hypertension    Hypertension Hypertension no significant medication side effects noted, borderline controlled, and needs further observation.  Medication(s): amlodipine 10 mg daily   Losartan 100 mg daily   Lasix 20 mg daily  BP up today, but usually well controlled.   BP Readings from Last 3 Encounters:  04/15/22 (!) 137/95  03/18/22 115/80  02/25/22 (!) 136/92   Lab Results  Component Value Date   CREATININE 0.98 12/14/2021   CREATININE 0.90 06/18/2021   CREATININE 0.89 03/05/2021  No results found for: "GFR"  Plan: Continue all antihypertensives at current dosages. Monitor closely over the next few weeks.  Continue to work on nutrition plan to promote weight loss and improve BP control.         Prediabetes - Primary    Prediabetes Last A1c was 5.9 - improved/ Insulin 11.4 improved, but not at goal.  Medication(s):  Metformin XR 750 mg daily per PCP. No side effects with metformin She is working on nutrition plan to decrease simple carbohydrates, increase lean proteins and exercise to promote weight loss, improve glycemic control and prevent progression to Type 2 diabetes.   Lab Results  Component Value Date   HGBA1C 5.9 (H) 12/14/2021   HGBA1C 6.4 (H) 06/18/2021   HGBA1C 6.0 (H) 03/05/2021   HGBA1C 6.0 09/29/2020   HGBA1C 6.3 (H) 06/12/2020   Lab Results  Component Value Date   INSULIN 11.4 12/14/2021   INSULIN 16.5 06/18/2021   INSULIN 13.9 03/05/2021   INSULIN 13.2 06/12/2020   Plan: Continue  metformin XR 750 mg daily per PCP. Continue working on nutrition plan to decrease simple carbohydrates, increase lean proteins and exercise to promote weight loss, improve glycemic control and prevent progression to Type 2 diabetes.         Vitamin D deficiency    Vitamin D Deficiency Vitamin D is not at goal of 50.  Most recent  vitamin D level was 47.3. She is on  prescription ergocalciferol 50,000 IU weekly. Lab Results  Component Value Date   VD25OH 47.3 12/14/2021   VD25OH 54.7 06/18/2021   VD25OH 47.9 03/05/2021   Plan: Continue and refill  prescription ergocalciferol 50,000 IU weekly Low vitamin D levels can be associated with adiposity and may result in leptin resistance and weight gain. Also associated with fatigue. Currently on vitamin D supplementation without any adverse effects.         Relevant Medications   Vitamin D, Ergocalciferol, (DRISDOL) 1.25 MG (50000 UNIT) CAPS capsule   Stress reaction with emotional eating    Other depression/emotional eating  Graylee has had issues with stress/emotional eating. Currently this is moderately controlled. Overall mood is stable. Medication(s): Bupropion SR 200 mg daily in am No side effects with bupropion, BP up a little lately. Monitor  Plan: Continue Bupropion SR 200 mg daily in am Monitor with some elevations in BP lately.  Continue to work on emotional eating strategies.           BMI 40.0-44.9, adult   Morbid obesity (HCC)-START BMI 47.03  Has done very well with weight loss overall. Down 23 lbs  TREATMENT PLAN FOR OBESITY:  Recommended Dietary Goals  Ariyana is currently in the action stage of change. As such, her goal is to continue weight management plan. She has agreed to the Category 1 Plan.  Behavioral Intervention  We discussed the following Behavioral Modification Strategies today: increasing lean protein intake, decreasing simple carbohydrates , increasing water intake, emotional eating strategies and understanding the difference between hunger signals and cravings, and planning for success.  Additional resources provided today: NA  Recommended Physical Activity Goals  Steely has been advised to work up to 150 minutes of moderate intensity aerobic activity a week and strengthening exercises 2-3 times per week for  cardiovascular health, weight loss maintenance and preservation of muscle mass.   She has agreed to Continue current level of physical activity    Pharmacotherapy We discussed various medication options to help Clintona with her weight loss efforts and we both agreed to continue to work on behavioral and nutritional strategies to promote weight loss.    Return in about 4 weeks (around 05/13/2022).Marland Kitchen She was informed of the importance of frequent follow up visits to maximize her success with intensive lifestyle modifications for her multiple health conditions.   ATTESTASTION STATEMENTS:  Reviewed by clinician on day of visit: allergies, medications, problem list, medical history, surgical history, family history, social history, and previous encounter notes.   I have personally spent 32 minutes total time today in preparation, patient care, nutritional counseling and documentation for this visit, including the following: review of clinical lab tests; review of medical tests/procedures/services.      Tykia Mellone, PA-C

## 2022-04-15 NOTE — Assessment & Plan Note (Signed)
Hypertension Hypertension no significant medication side effects noted, borderline controlled, and needs further observation.  Medication(s): amlodipine 10 mg daily   Losartan 100 mg daily   Lasix 20 mg daily  BP up today, but usually well controlled.   BP Readings from Last 3 Encounters:  04/15/22 (!) 137/95  03/18/22 115/80  02/25/22 (!) 136/92   Lab Results  Component Value Date   CREATININE 0.98 12/14/2021   CREATININE 0.90 06/18/2021   CREATININE 0.89 03/05/2021   No results found for: "GFR"  Plan: Continue all antihypertensives at current dosages. Monitor closely over the next few weeks.  Continue to work on nutrition plan to promote weight loss and improve BP control.

## 2022-05-03 ENCOUNTER — Other Ambulatory Visit: Payer: Self-pay

## 2022-05-03 ENCOUNTER — Other Ambulatory Visit (HOSPITAL_COMMUNITY): Payer: Self-pay

## 2022-05-03 ENCOUNTER — Encounter (INDEPENDENT_AMBULATORY_CARE_PROVIDER_SITE_OTHER): Payer: Self-pay | Admitting: Family Medicine

## 2022-05-04 ENCOUNTER — Other Ambulatory Visit (HOSPITAL_COMMUNITY): Payer: Self-pay

## 2022-05-13 ENCOUNTER — Ambulatory Visit (INDEPENDENT_AMBULATORY_CARE_PROVIDER_SITE_OTHER): Payer: Managed Care, Other (non HMO) | Admitting: Physician Assistant

## 2022-05-13 ENCOUNTER — Other Ambulatory Visit (HOSPITAL_COMMUNITY): Payer: Self-pay

## 2022-05-13 ENCOUNTER — Encounter (INDEPENDENT_AMBULATORY_CARE_PROVIDER_SITE_OTHER): Payer: Self-pay | Admitting: Physician Assistant

## 2022-05-13 VITALS — BP 129/88 | HR 82 | Temp 97.9°F | Ht <= 58 in | Wt 203.0 lb

## 2022-05-13 DIAGNOSIS — E559 Vitamin D deficiency, unspecified: Secondary | ICD-10-CM

## 2022-05-13 DIAGNOSIS — I10 Essential (primary) hypertension: Secondary | ICD-10-CM

## 2022-05-13 DIAGNOSIS — F430 Acute stress reaction: Secondary | ICD-10-CM | POA: Diagnosis not present

## 2022-05-13 DIAGNOSIS — R7303 Prediabetes: Secondary | ICD-10-CM

## 2022-05-13 DIAGNOSIS — Z6841 Body Mass Index (BMI) 40.0 and over, adult: Secondary | ICD-10-CM

## 2022-05-13 MED ORDER — VITAMIN D (ERGOCALCIFEROL) 1.25 MG (50000 UNIT) PO CAPS
50000.0000 [IU] | ORAL_CAPSULE | ORAL | 0 refills | Status: DC
  Filled 2022-05-13 – 2022-06-01 (×4): qty 4, 28d supply, fill #0

## 2022-05-13 MED ORDER — BUPROPION HCL ER (SR) 200 MG PO TB12
200.0000 mg | ORAL_TABLET | Freq: Every morning | ORAL | 0 refills | Status: DC
  Filled 2022-05-13: qty 90, 90d supply, fill #0
  Filled 2022-05-27: qty 30, 30d supply, fill #0
  Filled 2022-06-01: qty 90, 90d supply, fill #0
  Filled 2022-06-01: qty 30, 30d supply, fill #0

## 2022-05-13 NOTE — Assessment & Plan Note (Signed)
Vitamin D Deficiency Vitamin D is not at goal of 50.  Most recent vitamin D level was 47.3. She is on  prescription ergocalciferol 50,000 IU weekly. Lab Results  Component Value Date   VD25OH 47.3 12/14/2021   VD25OH 54.7 06/18/2021   VD25OH 47.9 03/05/2021    Plan: Continue and refill  prescription ergocalciferol 50,000 IU weekly Low vitamin D levels can be associated with adiposity and may result in leptin resistance and weight gain. Also associated with fatigue. Currently on vitamin D supplementation without any adverse effects.    

## 2022-05-13 NOTE — Assessment & Plan Note (Signed)
Prediabetes Last A1c was 6.0 at [facility]- Dr. Nehemiah Settle. Stable on metformin.   Medication(s):  metformin 750 mg daily. No GI or other side effects with metformin.  Lab Results  Component Value Date   HGBA1C 5.9 (H) 12/14/2021   HGBA1C 6.4 (H) 06/18/2021   HGBA1C 6.0 (H) 03/05/2021   HGBA1C 6.0 09/29/2020   HGBA1C 6.3 (H) 06/12/2020   Lab Results  Component Value Date   INSULIN 11.4 12/14/2021   INSULIN 16.5 06/18/2021   INSULIN 13.9 03/05/2021   INSULIN 13.2 06/12/2020    Plan: Continue  metformin 750 mg daily per Dr. Nehemiah Settle.  Continues off GLP-1 [facility]) medication and doing well overall/maintaining weight loss. Continue working on nutrition plan to decrease simple carbohydrates, increase lean proteins and exercise to promote weight loss, improve glycemic control and prevent progression to Type 2 diabetes.

## 2022-05-13 NOTE — Assessment & Plan Note (Signed)
Hypertension Hypertension improved, asymptomatic, no significant medication side effects noted, and needs further observation.  Medication(s): Amlodipine 10 mg daily    Losartan 100 mg daily.   BP Readings from Last 3 Encounters:  05/13/22 129/88  04/15/22 (!) 137/95  03/18/22 115/80   Lab Results  Component Value Date   CREATININE 0.98 12/14/2021   CREATININE 0.90 06/18/2021   CREATININE 0.89 03/05/2021   No results found for: "GFR"  Plan: Continue all antihypertensives at current dosages. Continue to monitor BP closely.  Continue to work on nutrition plan to promote weight loss and improve BP control.

## 2022-05-13 NOTE — Assessment & Plan Note (Signed)
Eating disorder/emotional eating Mary Odonnell has had issues with stress/emotional eating. Currently this is moderately controlled. Overall mood is stable. Medication(s): Bupropion SR 200 mg daily in am  Plan: Continue and refill Bupropion SR 200 mg daily in am Continue to work on emotional eating strategies.

## 2022-05-13 NOTE — Progress Notes (Signed)
Office: [phone]  /  Fax: [phone]  WEIGHT SUMMARY AND BIOMETRICS  Vitals Temp: 97.9 F (36.6 C) BP: 129/88 Pulse Rate: 82 SpO2: 98 %   Anthropometric Measurements Height: 4\' 10"  (1.473 m) Weight: 203 lb (92.1 kg) BMI (Calculated): 42.44 Weight at Last Visit: 202 lb Weight Lost Since Last Visit: 0 lb Weight Gained Since Last Visit: 1 lb Starting Weight: 225 lb   Body Composition  Body Fat %: 45.1 % Fat Mass (lbs): 91.8 lbs Muscle Mass (lbs): 106.2 lbs Total Body Water (lbs): 73.6 lbs Visceral Fat Rating : 15   Other Clinical Data Fasting: yes Labs: no Today's Visit #: 34 Starting Date: 06/12/20     HPI  Chief Complaint: OBESITY  Claudette is here to discuss her progress with her obesity treatment plan. She is on the the Category 1 Plan and states she is following her eating plan approximately 50 % of the time. She states she is exercising/walking 20 minutes 5 times per week.   Interval History:  Since last office visit she is up 1 pound/down 23 pounds overall. Bioimpedance: Down 0.4 pounds muscle mass/up 1.6 adipose mass/up 1.6 total water Using some [employer] prepared meal kits. Hunger/appetite-monitored control Hydration-she has increased her water intake and is working towards getting at least 60 ounces daily Exercise-walking 5 days weekly now for at least 20 minutes at a time Stress-better overall. Dtr graduating and another moved to [place], but coping well.  Work continues to be stressful but she is some good strategies to help.  She saw her PCP-Dr. Nehemiah Settle and copies of labs were forwarded.   Pharmacotherapy: Reginal Lutes stopped due to no insurance coverage in March .                                     Metformin for prediabetes per PCP- no GI side effects.                                      Wellbutrin SR 200 mg daily for craving/emotional eating - No side effects  PHYSICAL EXAM:  Blood pressure 129/88, pulse 82, temperature 97.9 F (36.6  C), height 4\' 10"  (1.473 m), weight 203 lb (92.1 kg), last menstrual period 01/24/2016, SpO2 98 %. Body mass index is 42.43 kg/m.  General: She is overweight, cooperative, alert, well developed, and in no acute distress. PSYCH: Has normal mood, affect and thought process.   Cardiovascular: HR 80's regular. Lungs: Normal breathing effort, no conversational dyspnea. Neuro: no focal deficits.   DIAGNOSTIC DATA REVIEWED:  BMET    Component Value Date/Time   NA 140 12/14/2021 0838   K 4.3 12/14/2021 0838   CL 102 12/14/2021 0838   CO2 23 12/14/2021 0838   GLUCOSE 100 (H) 12/14/2021 0838   GLUCOSE 96 03/30/2016 1041   BUN 12 12/14/2021 0838   CREATININE 0.98 12/14/2021 0838   CALCIUM 9.0 12/14/2021 0838   GFRNONAA 96 08/21/2018 0931   GFRAA 110 08/21/2018 0931   Lab Results  Component Value Date   HGBA1C 5.9 (H) 12/14/2021   HGBA1C 6.1 (H) 10/11/2017   Lab Results  Component Value Date   INSULIN 11.4 12/14/2021   INSULIN 13.2 06/12/2020   Lab Results  Component Value Date   TSH 1.300 06/18/2021   CBC    Component Value Date/Time  WBC 4.0 12/14/2021 0838   RBC 5.06 12/14/2021 0838   RBC 4.95 09/29/2020 0000   HGB 14.1 12/14/2021 0838   HCT 43.3 12/14/2021 0838   PLT 313 12/14/2021 0838   MCV 86 12/14/2021 0838   MCH 27.9 12/14/2021 0838   MCHC 32.6 12/14/2021 0838   RDW 13.8 12/14/2021 0838   Iron Studies No results found for: "IRON", "TIBC", "FERRITIN", "IRONPCTSAT" Lipid Panel     Component Value Date/Time   CHOL 172 12/14/2021 0838   TRIG 65 12/14/2021 0838   HDL 65 12/14/2021 0838   CHOLHDL 2.4 06/18/2021 0936   LDLCALC 94 12/14/2021 0838   Hepatic Function Panel     Component Value Date/Time   PROT 7.1 12/14/2021 0838   ALBUMIN 4.4 12/14/2021 0838   AST 13 12/14/2021 0838   ALT 13 12/14/2021 0838   ALKPHOS 83 12/14/2021 0838   BILITOT 0.7 12/14/2021 0838      Component Value Date/Time   TSH 1.300 06/18/2021 0936   Nutritional Lab  Results  Component Value Date   VD25OH 47.3 12/14/2021   VD25OH 54.7 06/18/2021   VD25OH 47.9 03/05/2021    ASSOCIATED CONDITIONS ADDRESSED TODAY  ASSESSMENT AND PLAN  Problem List Items Addressed This Visit     Essential hypertension    Hypertension Hypertension improved, asymptomatic, no significant medication side effects noted, and needs further observation.  Medication(s): Amlodipine 10 mg daily    Losartan 100 mg daily.   BP Readings from Last 3 Encounters:  05/13/22 129/88  04/15/22 (!) 137/95  03/18/22 115/80   Lab Results  Component Value Date   CREATININE 0.98 12/14/2021   CREATININE 0.90 06/18/2021   CREATININE 0.89 03/05/2021  No results found for: "GFR"  Plan: Continue all antihypertensives at current dosages. Continue to monitor BP closely.  Continue to work on nutrition plan to promote weight loss and improve BP control.         Prediabetes - Primary    Prediabetes Last A1c was 6.0 at [facility]- Dr. Nehemiah Settle. Stable on metformin.   Medication(s):  metformin 750 mg daily. No GI or other side effects with metformin.  Lab Results  Component Value Date   HGBA1C 5.9 (H) 12/14/2021   HGBA1C 6.4 (H) 06/18/2021   HGBA1C 6.0 (H) 03/05/2021   HGBA1C 6.0 09/29/2020   HGBA1C 6.3 (H) 06/12/2020   Lab Results  Component Value Date   INSULIN 11.4 12/14/2021   INSULIN 16.5 06/18/2021   INSULIN 13.9 03/05/2021   INSULIN 13.2 06/12/2020   Plan: Continue  metformin 750 mg daily per Dr. Nehemiah Settle.  Continues off GLP-1 [facility]) medication and doing well overall/maintaining weight loss. Continue working on nutrition plan to decrease simple carbohydrates, increase lean proteins and exercise to promote weight loss, improve glycemic control and prevent progression to Type 2 diabetes.         Vitamin D deficiency    Vitamin D Deficiency Vitamin D is not at goal of 50.  Most recent vitamin D level was 47.3. She is on  prescription ergocalciferol 50,000 IU weekly. Lab  Results  Component Value Date   VD25OH 47.3 12/14/2021   VD25OH 54.7 06/18/2021   VD25OH 47.9 03/05/2021   Plan: Continue and refill  prescription ergocalciferol 50,000 IU weekly Low vitamin D levels can be associated with adiposity and may result in leptin resistance and weight gain. Also associated with fatigue. Currently on vitamin D supplementation without any adverse effects.         Relevant Medications  Vitamin D, Ergocalciferol, (DRISDOL) 1.25 MG (50000 UNIT) CAPS capsule   Stress reaction with emotional eating    Eating disorder/emotional eating Janaria has had issues with stress/emotional eating. Currently this is moderately controlled. Overall mood is stable. Medication(s): Bupropion SR 200 mg daily in am  Plan: Continue and refill Bupropion SR 200 mg daily in am Continue to work on emotional eating strategies.           Relevant Medications   buPROPion (WELLBUTRIN SR) 200 MG 12 hr tablet   BMI 40.0-44.9, adult (HCC)   Morbid obesity (HCC)-START BMI 47.03   Current BMI 42.6 TBW loss 9.8%  TREATMENT PLAN FOR OBESITY:  Recommended Dietary Goals  Janiylah is currently in the action stage of change. As such, her goal is to continue weight management plan. She has agreed to the Category 1 Plan.  Behavioral Intervention  We discussed the following Behavioral Modification Strategies today: increasing lean protein intake, decreasing simple carbohydrates , increasing vegetables, avoiding skipping meals, increasing water intake, decreasing eating out or consumption of processed foods, and making healthy choices when eating convenient foods, continue to practice mindfulness when eating, and planning for success.  Additional resources provided today: NA  Recommended Physical Activity Goals  Eufelia has been advised to work up to 150 minutes of moderate intensity aerobic activity a week and strengthening exercises 2-3 times per week for cardiovascular health, weight  loss maintenance and preservation of muscle mass.   She has agreed to Continue current level of physical activity  Discussed using some ankle weights or a weighted vest .    Pharmacotherapy We discussed various medication options to help Caralina with her weight loss efforts and we both agreed to continue metformin for prediabetes and Wellbutrin for emotional eating/cravings and continue to work on nutritional and behavioral strategies to promote weight loss.     Return in about 3 weeks (around 06/03/2022).Marland Kitchen She was informed of the importance of frequent follow up visits to maximize her success with intensive lifestyle modifications for her multiple health conditions.   ATTESTASTION STATEMENTS:  Reviewed by clinician on day of visit: allergies, medications, problem list, medical history, surgical history, family history, social history, and previous encounter notes.   I have personally spent 44 minutes total time today in preparation, patient care, nutritional counseling and documentation for this visit, including the following: review of clinical lab tests; review of medical tests/procedures/services.      Kameelah Minish, PA-C

## 2022-05-26 LAB — HEMOGLOBIN A1C: Hemoglobin A1C: 5.8

## 2022-05-27 ENCOUNTER — Other Ambulatory Visit: Payer: Self-pay

## 2022-05-27 ENCOUNTER — Other Ambulatory Visit (HOSPITAL_COMMUNITY): Payer: Self-pay

## 2022-05-27 LAB — LAB REPORT - SCANNED
A1c: 5.8
EGFR: 74

## 2022-06-01 ENCOUNTER — Other Ambulatory Visit: Payer: Self-pay

## 2022-06-01 ENCOUNTER — Other Ambulatory Visit (HOSPITAL_COMMUNITY): Payer: Self-pay

## 2022-06-03 ENCOUNTER — Encounter (INDEPENDENT_AMBULATORY_CARE_PROVIDER_SITE_OTHER): Payer: Self-pay | Admitting: Physician Assistant

## 2022-06-04 ENCOUNTER — Other Ambulatory Visit (HOSPITAL_COMMUNITY): Payer: Self-pay

## 2022-06-10 ENCOUNTER — Ambulatory Visit (INDEPENDENT_AMBULATORY_CARE_PROVIDER_SITE_OTHER): Payer: Managed Care, Other (non HMO) | Admitting: Physician Assistant

## 2022-06-10 ENCOUNTER — Other Ambulatory Visit (HOSPITAL_COMMUNITY): Payer: Self-pay

## 2022-06-10 ENCOUNTER — Encounter (INDEPENDENT_AMBULATORY_CARE_PROVIDER_SITE_OTHER): Payer: Self-pay | Admitting: Physician Assistant

## 2022-06-10 VITALS — BP 129/90 | HR 78 | Temp 98.2°F | Ht <= 58 in | Wt 205.0 lb

## 2022-06-10 DIAGNOSIS — E559 Vitamin D deficiency, unspecified: Secondary | ICD-10-CM

## 2022-06-10 DIAGNOSIS — R7303 Prediabetes: Secondary | ICD-10-CM | POA: Diagnosis not present

## 2022-06-10 DIAGNOSIS — K760 Fatty (change of) liver, not elsewhere classified: Secondary | ICD-10-CM

## 2022-06-10 DIAGNOSIS — F430 Acute stress reaction: Secondary | ICD-10-CM | POA: Diagnosis not present

## 2022-06-10 DIAGNOSIS — Z6841 Body Mass Index (BMI) 40.0 and over, adult: Secondary | ICD-10-CM

## 2022-06-10 MED ORDER — TIRZEPATIDE-WEIGHT MANAGEMENT 2.5 MG/0.5ML ~~LOC~~ SOAJ
2.5000 mg | SUBCUTANEOUS | 0 refills | Status: DC
Start: 2022-06-10 — End: 2022-07-01
  Filled 2022-06-10: qty 2, 28d supply, fill #0

## 2022-06-10 MED ORDER — VITAMIN D (ERGOCALCIFEROL) 1.25 MG (50000 UNIT) PO CAPS
50000.0000 [IU] | ORAL_CAPSULE | ORAL | 0 refills | Status: DC
Start: 2022-06-10 — End: 2022-07-01
  Filled 2022-06-10 – 2022-06-30 (×2): qty 4, 28d supply, fill #0

## 2022-06-10 MED ORDER — BUPROPION HCL ER (SR) 200 MG PO TB12
200.0000 mg | ORAL_TABLET | Freq: Every morning | ORAL | 0 refills | Status: DC
Start: 2022-06-10 — End: 2022-07-01
  Filled 2022-06-10: qty 90, 90d supply, fill #0

## 2022-06-10 NOTE — Progress Notes (Signed)
.smr  Office: [phone]  /  Fax: [phone]  WEIGHT SUMMARY AND BIOMETRICS  Vitals Temp: 98.2 F (36.8 C) BP: (!) 129/90 Pulse Rate: 78 SpO2: 97 %   Anthropometric Measurements Height: 4\' 10"  (1.473 m) Weight: 205 lb (93 kg) BMI (Calculated): 42.86 Weight at Last Visit: 203 lb Weight Lost Since Last Visit: 0 Weight Gained Since Last Visit: 2 lb Starting Weight: 225 lb Total Weight Loss (lbs): 20 lb (9.072 kg)   Body Composition  Body Fat %: 45.8 % Fat Mass (lbs): 94.2 lbs Muscle Mass (lbs): 106 lbs Total Body Water (lbs): 74.4 lbs Visceral Fat Rating : 15   Other Clinical Data Labs: No Today's Visit #: 35 Starting Date: 06/12/20     HPI  Chief Complaint: OBESITY  Collins is here to discuss her progress with her obesity treatment plan. She is on the the Category 1 Plan and states she is following her eating plan approximately 50 % of the time. She states she is exercising/walking 20 minutes 6-7 times per week.  Discussed the use of AI scribe software for clinical note transcription with the patient, who gave verbal consent to proceed.   Interval History:  History of Present Illness Justine Nighswander, a patient with obesity, prediabetes, and vitamin D deficiency, presents for a follow-up visit.  She has been managing her conditions with a wellness plan that includes dietary modifications and regular exercise.  Recently, she started taking Veozah for hot flashes and has noticed a possible increase in her appetite since starting the medication. She is unsure if this is a side effect of the medication or a result of other factors. Despite this, she has been diligent with her exercise routine, walking daily for at least 20 minutes. She has also been mindful of her hydration and protein intake. She has been on Wellbutrin, with no reported side effects, and has been taking vitamin D supplements, which have improved her vitamin D levels. Assessment & Plan Obesity: Patient  reports increased appetite possibly related to new medication for hot flashes. Maintaining exercise regimen with walking and some weight training. Discussed the importance of hydration, protein intake, and weight training for muscle mass maintenance. -Initiate prior authorization for Zepbound and she has already signed up for weight watchers as per her guidelines with. -Encourage continuation of walking and weight training exercises. -Advise on maintaining hydration and protein intake.   Pharmacotherapy: metformin 750 mg daily for prediabetes. No GI side effects with metformin.   TREATMENT PLAN FOR OBESITY:  Recommended Dietary Goals  Rola is currently in the action stage of change. As such, her goal is to continue weight management plan. She has agreed to the Category 1 Plan.  Behavioral Intervention  We discussed the following Behavioral Modification Strategies today: increasing lean protein intake, decreasing simple carbohydrates , increasing vegetables, increasing lower glycemic fruits, increasing fiber rich foods, avoiding skipping meals, increasing water intake, emotional eating strategies and understanding the difference between hunger signals and cravings, continue to practice mindfulness when eating, and planning for success.  Additional resources provided today: NA  Recommended Physical Activity Goals  Jakari has been advised to work up to 150 minutes of moderate intensity aerobic activity a week and strengthening exercises 2-3 times per week for cardiovascular health, weight loss maintenance and preservation of muscle mass.   She has agreed to Continue current level of physical activity  and Start strengthening exercises with a goal of 2-3 sessions a week    Pharmacotherapy We discussed various medication options  to help Cecylia with her weight loss efforts and we both agreed to start Zepbound for weight loss.    Return in about 3 weeks (around 07/01/2022).Marland Kitchen She was  informed of the importance of frequent follow up visits to maximize her success with intensive lifestyle modifications for her multiple health conditions.  PHYSICAL EXAM:  Blood pressure (!) 129/90, pulse 78, temperature 98.2 F (36.8 C), height 4\' 10"  (1.473 m), weight 205 lb (93 kg), last menstrual period 01/24/2016, SpO2 97 %. Body mass index is 42.85 kg/m.  General: She is overweight, cooperative, alert, well developed, and in no acute distress. PSYCH: Has normal mood, affect and thought process.   Cardiovascular: HR 70's BP 129/90 Lungs: Normal breathing effort, no conversational dyspnea. Neuro: no focal deficit  DIAGNOSTIC DATA REVIEWED:  BMET    Component Value Date/Time   NA 140 12/14/2021 0838   K 4.3 12/14/2021 0838   CL 102 12/14/2021 0838   CO2 23 12/14/2021 0838   GLUCOSE 100 (H) 12/14/2021 0838   GLUCOSE 96 03/30/2016 1041   BUN 12 12/14/2021 0838   CREATININE 0.98 12/14/2021 0838   CALCIUM 9.0 12/14/2021 0838   GFRNONAA 96 08/21/2018 0931   GFRAA 110 08/21/2018 0931   Lab Results  Component Value Date   HGBA1C 5.9 (H) 12/14/2021   HGBA1C 6.1 (H) 10/11/2017   Lab Results  Component Value Date   INSULIN 11.4 12/14/2021   INSULIN 13.2 06/12/2020   Lab Results  Component Value Date   TSH 1.300 06/18/2021   CBC    Component Value Date/Time   WBC 4.0 12/14/2021 0838   RBC 5.06 12/14/2021 0838   RBC 4.95 09/29/2020 0000   HGB 14.1 12/14/2021 0838   HCT 43.3 12/14/2021 0838   PLT 313 12/14/2021 0838   MCV 86 12/14/2021 0838   MCH 27.9 12/14/2021 0838   MCHC 32.6 12/14/2021 0838   RDW 13.8 12/14/2021 0838   Iron Studies No results found for: "IRON", "TIBC", "FERRITIN", "IRONPCTSAT" Lipid Panel     Component Value Date/Time   CHOL 172 12/14/2021 0838   TRIG 65 12/14/2021 0838   HDL 65 12/14/2021 0838   CHOLHDL 2.4 06/18/2021 0936   LDLCALC 94 12/14/2021 0838   Hepatic Function Panel     Component Value Date/Time   PROT 7.1 12/14/2021 0838    ALBUMIN 4.4 12/14/2021 0838   AST 13 12/14/2021 0838   ALT 13 12/14/2021 0838   ALKPHOS 83 12/14/2021 0838   BILITOT 0.7 12/14/2021 0838      Component Value Date/Time   TSH 1.300 06/18/2021 0936   Nutritional Lab Results  Component Value Date   VD25OH 47.3 12/14/2021   VD25OH 54.7 06/18/2021   VD25OH 47.9 03/05/2021    ASSOCIATED CONDITIONS ADDRESSED TODAY  ASSESSMENT AND PLAN  Problem List Items Addressed This Visit     Prediabetes - Primary   Vitamin D deficiency   Relevant Medications   Vitamin D, Ergocalciferol, (DRISDOL) 1.25 MG (50000 UNIT) CAPS capsule   NAFLD (nonalcoholic fatty liver disease)   Stress reaction with emotional eating   Relevant Medications   buPROPion (WELLBUTRIN SR) 200 MG 12 hr tablet   BMI 40.0-44.9, adult (HCC)   Relevant Medications   tirzepatide (ZEPBOUND) 2.5 MG/0.5ML Pen   Morbid obesity (HCC)-START BMI 47.03   Relevant Medications   tirzepatide (ZEPBOUND) 2.5 MG/0.5ML Pen      Prediabetes Last A1c was 5.9  Medication(s):  metformin 750 mg daily. Polyphagia:No Lab Results  Component Value  Date   HGBA1C 5.9 (H) 12/14/2021   HGBA1C 6.4 (H) 06/18/2021   HGBA1C 6.0 (H) 03/05/2021   HGBA1C 6.0 09/29/2020   HGBA1C 6.3 (H) 06/12/2020   Lab Results  Component Value Date   INSULIN 11.4 12/14/2021   INSULIN 16.5 06/18/2021   INSULIN 13.9 03/05/2021   INSULIN 13.2 06/12/2020    Plan: Continue  metformin 750 mg daily.   No GI side effects with metformin.   Improved HbA1c from 5.9 to 5.8. Discussed potential benefits of Zepbound for further reduction in A1c and to help promote further weight loss. We have reviewed the risks and benefits of using a GLP-1/GIP medication- Zepbound. The patient denies a personal or family history of medullary thyroid cancer or MENII. The patient denies a history of pancreatitis. The potential risks and benefits of this GLP-1/GIP medications were reviewed with the patient, and alternative treatment  options were discussed. All questions were answered, and the patient wishes to move forward with this medication. Start Zepbound 2.5 mg weekly. Will begin PA process for Zepbound.  -Continue monitoring HbA1c levels.  Vitamin D Deficiency Vitamin D is not at goal of 50.  Most recent vitamin D level was 47.3. She is on  prescription ergocalciferol 50,000 IU weekly. Lab Results  Component Value Date   VD25OH 47.3 12/14/2021   VD25OH 54.7 06/18/2021   VD25OH 47.9 03/05/2021    Plan: Continue and refill  prescription ergocalciferol 50,000 IU weekly  Current level within goal range (50-70). Low vitamin D levels can be associated with adiposity and may result in leptin resistance and weight gain. Also associated with fatigue. Currently on vitamin D supplementation without any adverse effects.  -Continue current Vitamin D supplementation.   Other depression/emotional eating Namita has had issues with stress/emotional eating. Currently this is moderately controlled. Overall mood is stable. Medication(s): Bupropion SR 200 mg daily in am  Plan: Continue and refill Bupropion SR 200 mg daily in am Stress and emotional eating: Patient reports good stress management and has been mindful of eating habits.-Refill Wellbutrin. -Continue current stress management strategies.  Hot flashes: New medication seems to be effective, but may be contributing to increased appetite. -Monitor response to hot flash medication and its potential impact on appetite.  NAFLD: Liver enzymes within normal range despite fatty liver diagnosis. -Continue monitoring liver function.  Lab results reviewed with patient.  Disease counseling done.  Intensive lifestyle modifications are the first line treatment for this issue.  We discussed several lifestyle modifications today and she will continue to work on diet, exercise and weight loss efforts.   Counseling: NAFLD is an umbrella term that encompasses a disease spectrum  that includes steatosis (fat) without inflammation, steatohepatitis (NASH; fat + inflammation in a characteristic pattern), and cirrhosis. Bland steatosis is felt to be a benign condition, with extremely low to no risk of progression to cirrhosis, whereas NASH can progress to cirrhosis. The mainstay of treatment of NAFLD includes lifestyle modification to achieve weight loss, at least 7% of current body weight. Low carbohydrate diets can be beneficial in improving NAFLD liver histology. Additionally, exercise, even the absence of weight loss can have beneficial effects on the patient's metabolic profile and liver health. We recommend that their metabolic comorbidities be aggressively managed, as patients with NAFLD are at increased risk of coronary artery disease.   Hyperlipidemia LDL is at goal. As is HDL and triglycerides Medication(s): None Cardiovascular risk factors: dyslipidemia, hypertension, and obesity (BMI >= 30 kg/m2)  Lab Results  Component Value  Date   CHOL 172 12/14/2021   HDL 65 12/14/2021   LDLCALC 94 12/14/2021   TRIG 65 12/14/2021   CHOLHDL 2.4 06/18/2021   CHOLHDL 2.8 06/12/2020   CHOLHDL 3.0 04/11/2018   Lab Results  Component Value Date   ALT 13 12/14/2021   AST 13 12/14/2021   ALKPHOS 83 12/14/2021   BILITOT 0.7 12/14/2021   The 10-year ASCVD risk score (Arnett DK, et al., 2019) is: 8.8%   Values used to calculate the score:     Age: 57 years     Sex: Female     Is Non-Hispanic African American: Yes     Diabetic: Yes     Tobacco smoker: No     Systolic Blood Pressure: 129 mmHg     Is BP treated: Yes     HDL Cholesterol: 65 mg/dL     Total Cholesterol: 172 mg/dL  Plan:  Continue to work on Engineer, technical sales -decreasing simple carbohydrates, increasing lean proteins, decreasing saturated fats and cholesterol , avoiding trans fats and exercise as able to promote weight loss, improve lipids and decrease cardiovascular risks.  Cholesterol, triglycerides, HDL,  and LDL within goal ranges. -Continue current lipid management strategies.  General Health Maintenance:  -Continue monitoring kidney function due to potential impact from hot flash medication. -Check labs in several months after initiation of Zepbound.   ATTESTASTION STATEMENTS:  Reviewed by clinician on day of visit: allergies, medications, problem list, medical history, surgical history, family history, social history, and previous encounter notes.   I have personally spent 40 minutes total time today in preparation, patient care, nutritional counseling and documentation for this visit, including the following: review of clinical lab tests; review of medical tests/procedures/services.      Kaytlin Burklow, PA-C

## 2022-06-11 ENCOUNTER — Other Ambulatory Visit (HOSPITAL_COMMUNITY): Payer: Self-pay

## 2022-06-15 ENCOUNTER — Telehealth (INDEPENDENT_AMBULATORY_CARE_PROVIDER_SITE_OTHER): Payer: Self-pay

## 2022-06-15 ENCOUNTER — Telehealth: Payer: Self-pay

## 2022-06-15 NOTE — Telephone Encounter (Signed)
PA submitted through Cover My Meds for Zepbound. Awaiting insurance determination. Key: Joya Salm

## 2022-06-15 NOTE — Telephone Encounter (Signed)
Per Cover My Meds: Per your health plan's criteria, this drug is covered if you meet the following: (1) One of the following: (A) You are new to Zepbound therapy or have been on Zepbound therapy for less than 6 months. (B) You have been on therapy for at least 6 months and have had a weight loss of more than or equal to 5% of baseline body weight. (2) You are taking the drug and making changes in your life (for example: diet or reducing calories, exercise, behavioral support, or community based program). The information provided does not show that you meet the criteria listed above. Please speak with your doctor about your choices. Reviewed by: Liana Gerold, R.Ph.

## 2022-06-15 NOTE — Telephone Encounter (Signed)
I have resubmitted PA for Zepbound. Key: [id number]

## 2022-06-15 NOTE — Telephone Encounter (Signed)
Patients PA was denied due to not being on the medication the full term. PA needs to be submitted like a New-therapy stated from Pharmacy Benefits.  Phone Number [phone] KEY: [id number]

## 2022-06-15 NOTE — Telephone Encounter (Signed)
New message from Cover My Meds: They have now APPROVED the Zepbound. Please disregard the last message I sent you. Request Reference Number: [id number]. ZEPBOUND INJ 2.5MG  is approved through 12/15/2022. Your patient may now fill this prescription and it will be covered.

## 2022-06-15 NOTE — Telephone Encounter (Signed)
I started a PA for Zepbound today, this was the message I received after.   This medication or product was previously approved on [id number] from 2022-06-15 to 2022-12-15. **Please note: This request was submitted electronically.

## 2022-06-16 ENCOUNTER — Other Ambulatory Visit (HOSPITAL_COMMUNITY): Payer: Self-pay

## 2022-06-30 ENCOUNTER — Other Ambulatory Visit: Payer: Self-pay

## 2022-06-30 ENCOUNTER — Other Ambulatory Visit (HOSPITAL_COMMUNITY): Payer: Self-pay

## 2022-07-01 ENCOUNTER — Encounter (INDEPENDENT_AMBULATORY_CARE_PROVIDER_SITE_OTHER): Payer: Self-pay | Admitting: Physician Assistant

## 2022-07-01 ENCOUNTER — Other Ambulatory Visit (HOSPITAL_COMMUNITY): Payer: Self-pay

## 2022-07-01 ENCOUNTER — Ambulatory Visit (INDEPENDENT_AMBULATORY_CARE_PROVIDER_SITE_OTHER): Payer: Managed Care, Other (non HMO) | Admitting: Physician Assistant

## 2022-07-01 VITALS — BP 130/89 | HR 79 | Temp 97.6°F | Ht <= 58 in | Wt 203.0 lb

## 2022-07-01 DIAGNOSIS — E559 Vitamin D deficiency, unspecified: Secondary | ICD-10-CM | POA: Diagnosis not present

## 2022-07-01 DIAGNOSIS — R7303 Prediabetes: Secondary | ICD-10-CM

## 2022-07-01 DIAGNOSIS — I10 Essential (primary) hypertension: Secondary | ICD-10-CM | POA: Diagnosis not present

## 2022-07-01 DIAGNOSIS — Z6841 Body Mass Index (BMI) 40.0 and over, adult: Secondary | ICD-10-CM

## 2022-07-01 DIAGNOSIS — F439 Reaction to severe stress, unspecified: Secondary | ICD-10-CM

## 2022-07-01 MED ORDER — TIRZEPATIDE-WEIGHT MANAGEMENT 5 MG/0.5ML ~~LOC~~ SOAJ
5.0000 mg | SUBCUTANEOUS | 0 refills | Status: DC
Start: 2022-07-01 — End: 2022-07-22
  Filled 2022-07-01: qty 2, 28d supply, fill #0

## 2022-07-01 MED ORDER — BUPROPION HCL ER (SR) 150 MG PO TB12
150.0000 mg | ORAL_TABLET | Freq: Every day | ORAL | 0 refills | Status: DC
Start: 2022-07-01 — End: 2022-07-22
  Filled 2022-07-01: qty 30, 30d supply, fill #0

## 2022-07-01 MED ORDER — VITAMIN D (ERGOCALCIFEROL) 1.25 MG (50000 UNIT) PO CAPS
50000.0000 [IU] | ORAL_CAPSULE | ORAL | 0 refills | Status: AC
Start: 2022-07-01 — End: ?
  Filled 2022-07-02: qty 4, 28d supply, fill #0

## 2022-07-01 NOTE — Progress Notes (Signed)
.smr  Office: [phone]  /  Fax: [phone]  WEIGHT SUMMARY AND BIOMETRICS  Vitals Temp: 97.6 F (36.4 C) BP: 130/89 Pulse Rate: 79 SpO2: 94 %   Anthropometric Measurements Height: 4\' 10"  (1.473 m) Weight: 203 lb (92.1 kg) BMI (Calculated): 42.44 Weight at Last Visit: 205 lb Weight Lost Since Last Visit: 2 lb Starting Weight: 225 lb Total Weight Loss (lbs): 22 lb (9.979 kg)   Body Composition  Body Fat %: 44.9 % Fat Mass (lbs): 91.4 lbs Muscle Mass (lbs): 106.6 lbs Total Body Water (lbs): 74.4 lbs Visceral Fat Rating : 15   Other Clinical Data Labs: No Today's Visit #: 54 Starting Date: 06/12/20     HPI  Chief Complaint: OBESITY  Mary Odonnell is here to discuss her progress with her obesity treatment plan. She is on the the Category 1 Plan and states she is following her eating plan approximately 50-60 % of the time. She states she is exercising 30 minutes 4 times per week.   Interval History:  Since last office visit she down 2 lbs.  Hunger/appetite-moderate control Cravings- not excessive, still some sugar cravings Stress- decreased overall with kids both out of home now, but traveling frequently to see kids/adjusting to empty nest Sleep- Stopped Veozah for hot flashes and plans to discuss further with GYN and has upcoming visit. Wants to try and minimize medications for now and see how she does with Zepbound for weight loss. Exercise-increased walking and trying to be consistently getting in steps  Labs CMET and Vitamin D this visit.   She was informed we would discuss her lab results at her next visit unless there is a critical issue that needs to be addressed sooner. She agreed to keep her next visit at the agreed upon time to discuss these results.   Pharmacotherapy: Zepbound 2.5 mg weekly. Denies mass in neck, dysphagia, dyspepsia, persistent hoarseness, abdominal pain, or N/V/Constipation or diarrhea. Has annual eye exam. Last eye exam was 11/23-  Wonders if she is having some mild vision changes. Will monitor closely. She is not diabetic and has no history of retinopathy. Mood is stable.    TREATMENT PLAN FOR OBESITY:  Recommended Dietary Goals  Mary Odonnell is currently in the action stage of change. As such, her goal is to continue weight management plan. She has agreed to the Category 1 Plan.  Behavioral Intervention  We discussed the following Behavioral Modification Strategies today: increasing lean protein intake, decreasing simple carbohydrates , increasing vegetables, increasing lower glycemic fruits, increasing fiber rich foods, avoiding skipping meals, increasing water intake, continue to practice mindfulness when eating, and planning for success.  Additional resources provided today: NA  Recommended Physical Activity Goals  Mary Odonnell has been advised to work up to 150 minutes of moderate intensity aerobic activity a week and strengthening exercises 2-3 times per week for cardiovascular health, weight loss maintenance and preservation of muscle mass.   She has agreed to Continue current level of physical activity    Pharmacotherapy We discussed various medication options to help Mary Odonnell with her weight loss efforts and we both agreed to increase Zepbound to 5 mg weekly.    Return in about 3 weeks (around 07/22/2022).Marland Kitchen She was informed of the importance of frequent follow up visits to maximize her success with intensive lifestyle modifications for her multiple health conditions.  PHYSICAL EXAM:  Blood pressure 130/89, pulse 79, temperature 97.6 F (36.4 C), height 4\' 10"  (1.473 m), weight 203 lb (92.1 kg), last menstrual period 01/24/2016, SpO2  94 %. Body mass index is 42.43 kg/m.  General: She is overweight, cooperative, alert, well developed, and in no acute distress. PSYCH: Has normal mood, affect and thought process.   Cardiovascular: HR 70's regular.BP 130/89 Lungs: Normal breathing effort, no conversational  dyspnea. Neuro: no focal deficit  DIAGNOSTIC DATA REVIEWED:  BMET    Component Value Date/Time   NA 140 12/14/2021 0838   K 4.3 12/14/2021 0838   CL 102 12/14/2021 0838   CO2 23 12/14/2021 0838   GLUCOSE 100 (H) 12/14/2021 0838   GLUCOSE 96 03/30/2016 1041   BUN 12 12/14/2021 0838   CREATININE 0.98 12/14/2021 0838   CALCIUM 9.0 12/14/2021 0838   GFRNONAA 96 08/21/2018 0931   GFRAA 110 08/21/2018 0931   Lab Results  Component Value Date   HGBA1C 5.9 (H) 12/14/2021   HGBA1C 6.1 (H) 10/11/2017   Lab Results  Component Value Date   INSULIN 11.4 12/14/2021   INSULIN 13.2 06/12/2020   Lab Results  Component Value Date   TSH 0.80 12/14/2021   CBC    Component Value Date/Time   WBC 4.0 12/14/2021 0838   RBC 5.06 12/14/2021 0838   RBC 4.95 12/14/2021 0000   HGB 14.1 12/14/2021 0838   HCT 43.3 12/14/2021 0838   PLT 313 12/14/2021 0838   MCV 86 12/14/2021 0838   MCH 27.9 12/14/2021 0838   MCHC 32.6 12/14/2021 0838   RDW 13.8 12/14/2021 0838   Iron Studies No results found for: "IRON", "TIBC", "FERRITIN", "IRONPCTSAT" Lipid Panel     Component Value Date/Time   CHOL 172 12/14/2021 0838   TRIG 65 12/14/2021 0838   HDL 65 12/14/2021 0838   CHOLHDL 2.4 06/18/2021 0936   LDLCALC 94 12/14/2021 0838   Hepatic Function Panel     Component Value Date/Time   PROT 7.1 12/14/2021 0838   ALBUMIN 4.4 12/14/2021 0838   AST 13 12/14/2021 0838   ALT 13 12/14/2021 0838   ALKPHOS 83 12/14/2021 0838   BILITOT 0.7 12/14/2021 0838      Component Value Date/Time   TSH 0.80 12/14/2021 0000   TSH 1.300 06/18/2021 0936   Nutritional Lab Results  Component Value Date   VD25OH 47.3 12/14/2021   VD25OH 51.5 12/14/2021   VD25OH 54.7 06/18/2021    ASSOCIATED CONDITIONS ADDRESSED TODAY  ASSESSMENT AND PLAN  Problem List Items Addressed This Visit     Prediabetes - Primary   Vitamin D deficiency   Relevant Medications   Vitamin D, Ergocalciferol, (DRISDOL) 1.25 MG  (50000 UNIT) CAPS capsule   Other Relevant Orders   VITAMIN D 25 Hydroxy (Vit-D Deficiency, Fractures)   Stress   Relevant Medications   buPROPion (WELLBUTRIN SR) 150 MG 12 hr tablet   BMI 40.0-44.9, adult (HCC)   Relevant Medications   tirzepatide (ZEPBOUND) 5 MG/0.5ML Pen   Morbid obesity (HCC)-START BMI 47.03   Relevant Medications   tirzepatide (ZEPBOUND) 5 MG/0.5ML Pen   Other Relevant Orders   CMP14+EGFR   Prediabetes Last A1c was 5.8 slightly down on 05/26/22.   Medication(s): Zepbound 2.5 mg SQ weekly Denies mass in neck, dysphagia, dyspepsia, persistent hoarseness, abdominal pain, or N/V/Constipation or diarrhea. Has annual eye exam. Mood is stable.  She is working on Engineer, technical sales to decrease simple carbohydrates, increase lean proteins and exercise to promote weight loss, improve glycemic control and prevent progression to Type 2 diabetes.   Polyphagia:Yes Lab Results  Component Value Date   HGBA1C 5.9 (H) 12/14/2021   HGBA1C  5.8 12/14/2021   HGBA1C 6.4 (H) 06/18/2021   HGBA1C 6.0 (H) 03/05/2021   HGBA1C 6.0 09/29/2020   Lab Results  Component Value Date   INSULIN 11.4 12/14/2021   INSULIN 16.5 06/18/2021   INSULIN 13.9 03/05/2021   INSULIN 13.2 06/12/2020    Plan: Continue and increase dose Zepbound 5.0 mg SQ weekly Recheck A1c in 2 to 3 months following initiation of Zepbound therapy.  Continue working on nutrition plan to decrease simple carbohydrates, increase lean proteins and exercise to promote weight loss, improve glycemic control and prevent progression to Type 2 diabetes.    Hypertension Hypertension asymptomatic, reasonably well controlled, no significant medication side effects noted, and needs further observation.  Medication(s): Losartan 100 mg daily   Amlodipine 10 mg p.o. daily Kidney function is normal.    BP Readings from Last 3 Encounters:  07/01/22 130/89  06/10/22 (!) 129/90  05/13/22 129/88   Lab Results  Component Value Date    CREATININE 0.98 12/14/2021   CREATININE 0.9 12/14/2021   CREATININE 0.90 06/18/2021   No results found for: "GFR"  Plan: Recheck CMET this visit Continue all antihypertensives at current dosages.  Will need to monitor closely with patient on GLP-1/GIP therapy as we may need to decrease and/or discontinue antihypertensive medications if develops softer pressures or hypotension.  Will monitor closely. Continue to work on nutrition plan to promote weight loss and improve BP control.    Vitamin D Deficiency Vitamin D is not at goal of 50.  Most recent vitamin D level was 47.3. She is on  prescription ergocalciferol 50,000 IU weekly.  No side effects with ergocalciferol Lab Results  Component Value Date   VD25OH 47.3 12/14/2021   VD25OH 51.5 12/14/2021   VD25OH 54.7 06/18/2021    Plan: Continue and refill  prescription ergocalciferol 50,000 IU weekly Recheck vitamin D level today.  Low vitamin D levels can be associated with adiposity and may result in leptin resistance and weight gain. Also associated with fatigue. Currently on vitamin D supplementation without any adverse effects.    Stress/Mood disorder/emotional eating Mary Odonnell has had issues with stress/emotional eating. Currently this is well controlled. Overall mood is stable. Medication(s): Bupropion SR 200 mg daily in am no side effects with Wellbutrin.  The patient would like to see if we can decrease this dose back to previous dose and see how she does as she is feeling much less cravings/decreased emotional eating since starting Zepbound.  Plan: Continue and decrease dose Bupropion SR 150 mg daily in am Continue to work on emotional eating strategies.  ATTESTASTION STATEMENTS:  Reviewed by clinician on day of visit: allergies, medications, problem list, medical history, surgical history, family history, social history, and previous encounter notes.   I have personally spent 41 minutes total time today in preparation,  patient care, nutritional counseling and documentation for this visit, including the following: review of clinical lab tests; review of medical tests/procedures/services.      Dean Wonder, PA-C

## 2022-07-02 ENCOUNTER — Other Ambulatory Visit (HOSPITAL_COMMUNITY): Payer: Self-pay

## 2022-07-02 LAB — CMP14+EGFR
ALT: 15 IU/L (ref 0–32)
AST: 15 IU/L (ref 0–40)
Albumin: 4.3 g/dL (ref 3.8–4.9)
Alkaline Phosphatase: 78 IU/L (ref 44–121)
BUN/Creatinine Ratio: 12 (ref 9–23)
BUN: 11 mg/dL (ref 6–24)
Bilirubin Total: 0.3 mg/dL (ref 0.0–1.2)
CO2: 25 mmol/L (ref 20–29)
Calcium: 8.8 mg/dL (ref 8.7–10.2)
Chloride: 105 mmol/L (ref 96–106)
Creatinine, Ser: 0.92 mg/dL (ref 0.57–1.00)
Globulin, Total: 2.6 g/dL (ref 1.5–4.5)
Glucose: 89 mg/dL (ref 70–99)
Potassium: 4.2 mmol/L (ref 3.5–5.2)
Sodium: 143 mmol/L (ref 134–144)
Total Protein: 6.9 g/dL (ref 6.0–8.5)
eGFR: 74 mL/min/{1.73_m2} (ref >59)

## 2022-07-02 LAB — VITAMIN D 25 HYDROXY (VIT D DEFICIENCY, FRACTURES): Vit D, 25-Hydroxy: 46.9 ng/mL (ref 30.0–100.0)

## 2022-07-22 ENCOUNTER — Encounter (INDEPENDENT_AMBULATORY_CARE_PROVIDER_SITE_OTHER): Payer: Self-pay | Admitting: Physician Assistant

## 2022-07-22 ENCOUNTER — Ambulatory Visit (INDEPENDENT_AMBULATORY_CARE_PROVIDER_SITE_OTHER): Payer: Managed Care, Other (non HMO) | Admitting: Physician Assistant

## 2022-07-22 ENCOUNTER — Other Ambulatory Visit (HOSPITAL_COMMUNITY): Payer: Self-pay

## 2022-07-22 VITALS — BP 128/85 | HR 86 | Temp 98.2°F | Ht <= 58 in | Wt 200.0 lb

## 2022-07-22 DIAGNOSIS — F439 Reaction to severe stress, unspecified: Secondary | ICD-10-CM | POA: Diagnosis not present

## 2022-07-22 DIAGNOSIS — E559 Vitamin D deficiency, unspecified: Secondary | ICD-10-CM

## 2022-07-22 DIAGNOSIS — Z6841 Body Mass Index (BMI) 40.0 and over, adult: Secondary | ICD-10-CM | POA: Diagnosis not present

## 2022-07-22 MED ORDER — VITAMIN D (ERGOCALCIFEROL) 1.25 MG (50000 UNIT) PO CAPS
50000.0000 [IU] | ORAL_CAPSULE | ORAL | 0 refills | Status: DC
Start: 2022-07-22 — End: 2022-08-12
  Filled 2022-07-22: qty 4, 28d supply, fill #0

## 2022-07-22 MED ORDER — TIRZEPATIDE-WEIGHT MANAGEMENT 5 MG/0.5ML ~~LOC~~ SOAJ
5.0000 mg | SUBCUTANEOUS | 0 refills | Status: DC
Start: 2022-07-22 — End: 2022-08-12
  Filled 2022-07-22: qty 2, 28d supply, fill #0

## 2022-07-22 MED ORDER — BUPROPION HCL ER (SR) 150 MG PO TB12
150.0000 mg | ORAL_TABLET | Freq: Every day | ORAL | 0 refills | Status: DC
Start: 2022-07-22 — End: 2022-08-12
  Filled 2022-07-22: qty 30, 30d supply, fill #0

## 2022-07-22 NOTE — Progress Notes (Signed)
.smr  Office: [phone]  /  Fax: [phone]  WEIGHT SUMMARY AND BIOMETRICS  Vitals Temp: 98.2 F (36.8 C) BP: 128/85 Pulse Rate: 86 SpO2: 100 %   Anthropometric Measurements Height: 4\' 10"  (1.473 m) Weight: 200 lb (90.7 kg) BMI (Calculated): 41.81 Weight at Last Visit: 203 lb Weight Lost Since Last Visit: 3 Starting Weight: 225 lb Total Weight Loss (lbs): 25 lb (11.3 kg)   Body Composition  Body Fat %: 44.6 % Fat Mass (lbs): 89.4 lbs Muscle Mass (lbs): 105.4 lbs Total Body Water (lbs): 73.2 lbs Visceral Fat Rating : 14   Other Clinical Data Fasting: yes Labs: no Today's Visit #: 37 Starting Date: 06/12/20     HPI  Chief Complaint: OBESITY  Mary Odonnell is here to discuss her progress with her obesity treatment plan. She is on the the Category 1 Plan and states she is following her eating plan approximately 50 % of the time. She states she is exercising walking 20 minutes 3 times per week.  Discussed the use of AI scribe software for clinical note transcription with the patient, who gave verbal consent to proceed.     The patient, with a history of obesity, vitamin D deficiency, and stress/emotional eating, presents for a follow-up visit. She is currently on Zepbound 5 mg weekly, vitamin D supplementation, and bupropion. She reports a decrease in cravings for sweets and a reduced appetite since starting Zepbound She has been mindful to maintain regular meals despite the reduced appetite. She has noticed a change in her preference for fruits, finding some too sweet, and has been trying to balance her intake. She enjoys having an apple a day but has had difficulty finding apples she likes.  Her physical activity has been reduced due to the heat, but she has been finding creative ways to stay active indoors. She has been on the current dose of Zepbound for two weeks and has noticed a decrease in adipose and visceral fat rating. She has been managing stress related to  work and personal matters, and is considering whether to increase the dose of bupropion. She has been dealing with changes at work and in her youngest daughter's living situation.   Interval History:  Since last office visit she down 3 lbs.  Hunger/appetite-Moderate control. Not skipping meals Cravings- decreased cravings Stress- manageable. 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 Denies mass in neck, dysphagia, dyspepsia, persistent hoarseness, abdominal pain, or N/V/Constipation or diarrhea. Has annual eye exam. Mood is stable.    Metformin for prediabetes. No GI side effects with metformin.   TREATMENT PLAN FOR OBESITY:  Recommended Dietary Goals  Mary Odonnell is currently in the action stage of change. As such, her goal is to continue weight management plan. She has agreed to the Category 1 Plan.  Behavioral Intervention  We discussed the following Behavioral Modification Strategies today: increasing lean protein intake, decreasing simple carbohydrates , increasing vegetables, increasing lower glycemic fruits, avoiding skipping meals, increasing water intake, emotional eating strategies and understanding the difference between hunger signals and cravings, continue to practice mindfulness when eating, and planning for success.  Additional resources provided today: NA  Recommended Physical Activity Goals  Mary Odonnell has been advised to work up to 150 minutes of moderate intensity aerobic activity a week and strengthening exercises 2-3 times per week for cardiovascular health, weight loss maintenance and preservation of muscle  mass.   She has agreed to Continue current level of physical activity  and Think about ways to increase daily physical activity and overcoming barriers to exercise   Pharmacotherapy We discussed  various medication options to help Mary Odonnell with her weight loss efforts and we both agreed to continue Zepbound 5 mg weekly to promote weight loss and metformin 500 mg daily for prediabetes.    Return in about 4 weeks (around 08/19/2022).Marland Kitchen She was informed of the importance of frequent follow up visits to maximize her success with intensive lifestyle modifications for her multiple health conditions.  PHYSICAL EXAM:  Blood pressure 128/85, pulse 86, temperature 98.2 F (36.8 C), height 4\' 10"  (1.473 m), weight 200 lb (90.7 kg), last menstrual period 01/24/2016, SpO2 100%. Body mass index is 41.8 kg/m.  General: She is overweight, cooperative, alert, well developed, and in no acute distress. PSYCH: Has normal mood, affect and thought process.   Lungs: Normal breathing effort, no conversational dyspnea.  DIAGNOSTIC DATA REVIEWED:  BMET    Component Value Date/Time   NA 143 07/01/2022 0950   K 4.2 07/01/2022 0950   CL 105 07/01/2022 0950   CO2 25 07/01/2022 0950   GLUCOSE 89 07/01/2022 0950   GLUCOSE 96 03/30/2016 1041   BUN 11 07/01/2022 0950   CREATININE 0.92 07/01/2022 0950   CALCIUM 8.8 07/01/2022 0950   GFRNONAA 96 08/21/2018 0931   GFRAA 110 08/21/2018 0931   Lab Results  Component Value Date   HGBA1C 5.9 (H) 12/14/2021   HGBA1C 6.1 (H) 10/11/2017   Lab Results  Component Value Date   INSULIN 11.4 12/14/2021   INSULIN 13.2 06/12/2020   Lab Results  Component Value Date   TSH 0.80 12/14/2021   CBC    Component Value Date/Time   WBC 4.0 12/14/2021 0838   RBC 5.06 12/14/2021 0838   RBC 4.95 12/14/2021 0000   HGB 14.1 12/14/2021 0838   HCT 43.3 12/14/2021 0838   PLT 313 12/14/2021 0838   MCV 86 12/14/2021 0838   MCH 27.9 12/14/2021 0838   MCHC 32.6 12/14/2021 0838   RDW 13.8 12/14/2021 0838   Iron Studies No results found for: "IRON", "TIBC", "FERRITIN", "IRONPCTSAT" Lipid Panel     Component Value Date/Time   CHOL 172 12/14/2021 0838   TRIG 65 12/14/2021  0838   HDL 65 12/14/2021 0838   CHOLHDL 2.4 06/18/2021 0936   LDLCALC 94 12/14/2021 0838   Hepatic Function Panel     Component Value Date/Time   PROT 6.9 07/01/2022 0950   ALBUMIN 4.3 07/01/2022 0950   AST 15 07/01/2022 0950   ALT 15 07/01/2022 0950   ALKPHOS 78 07/01/2022 0950   BILITOT 0.3 07/01/2022 0950      Component Value Date/Time   TSH 0.80 12/14/2021 0000   TSH 1.300 06/18/2021 0936   Nutritional Lab Results  Component Value Date   VD25OH 46.9 07/01/2022   VD25OH 47.3 12/14/2021   VD25OH 51.5 12/14/2021    ASSOCIATED CONDITIONS ADDRESSED TODAY  ASSESSMENT AND PLAN  Problem List Items Addressed This Visit     Vitamin D deficiency   Relevant Medications   Vitamin D, Ergocalciferol, (DRISDOL) 1.25 MG (50000 UNIT) CAPS capsule   Stress - Primary   Relevant Medications   buPROPion (WELLBUTRIN SR) 150 MG 12 hr tablet   BMI 40.0-44.9, adult (HCC)   Relevant Medications   tirzepatide (ZEPBOUND) 5 MG/0.5ML Pen   Morbid obesity (HCC)-START BMI 47.03   Relevant Medications   tirzepatide (  ZEPBOUND) 5 MG/0.5ML Pen  Obesity: Noted decreased cravings and appetite suppression on Zepbound 5mg  weekly. Patient is mindful of maintaining regular meal times. Weight loss of 3 pounds and decrease in visceral fat rating from 15 to 14 noted. -Continue Zepbound 5mg  weekly. -Encourage continuation of creative physical activity strategies at home. -Consider increasing dose of Zepbound if needed after further assessment.  Vitamin D deficiency: No specific issues discussed. -Continue Vitamin D supplementation as prescribed. Vitamin D Deficiency Vitamin D is not at goal of 50.  Most recent vitamin D level was 46.9- Stable on supplementation weekly. She is on  prescription ergocalciferol 50,000 IU weekly. No N/V or muscle weakness or other side effects with Ergocalciferol.  Lab Results  Component Value Date   VD25OH 46.9 07/01/2022   VD25OH 47.3 12/14/2021   VD25OH 51.5  12/14/2021    Plan: Continue and refill  prescription ergocalciferol 50,000 IU weekly Low vitamin D levels can be associated with adiposity and may result in leptin resistance and weight gain. Also associated with fatigue. Currently on vitamin D supplementation without any adverse effects.     Stress/Emotional Eating: Patient is on bupropion. Recent stressors noted including changes at work and personal life. Mood overall stable. No SI/HI. Would like to continue reduced dose of bupropion for now.  -Continue bupropion. -Monitor stress levels and consider dose adjustment if needed.  Follow-up appointment: Current appointment on 31st needs to be rescheduled due to office closure. -Reschedule follow-up appointment for 1st at 9:45am.  ATTESTASTION STATEMENTS:  Reviewed by clinician on day of visit: allergies, medications, problem list, medical history, surgical history, family history, social history, and previous encounter notes.   I have personally spent 46 minutes total time today in preparation, patient care, nutritional counseling and documentation for this visit, including the following: review of clinical lab tests; review of medical tests/procedures/services.      Yavonne Kiss, PA-C

## 2022-07-26 ENCOUNTER — Other Ambulatory Visit (HOSPITAL_COMMUNITY): Payer: Self-pay

## 2022-07-30 ENCOUNTER — Other Ambulatory Visit (HOSPITAL_COMMUNITY): Payer: Self-pay

## 2022-08-05 LAB — HEMOGLOBIN A1C: Hemoglobin A1C: 5.5

## 2022-08-05 LAB — LIPID PANEL
Cholesterol: 141 (ref 0–200)
HDL: 62 (ref 35–70)
LDL Cholesterol: 67
Triglycerides: 58 (ref 40–160)

## 2022-08-05 LAB — BASIC METABOLIC PANEL: Creatinine: 1 (ref 0.5–1.1)

## 2022-08-05 LAB — COMPREHENSIVE METABOLIC PANEL: eGFR: 67

## 2022-08-06 ENCOUNTER — Encounter (INDEPENDENT_AMBULATORY_CARE_PROVIDER_SITE_OTHER): Payer: Self-pay | Admitting: Physician Assistant

## 2022-08-11 ENCOUNTER — Ambulatory Visit (INDEPENDENT_AMBULATORY_CARE_PROVIDER_SITE_OTHER): Payer: Managed Care, Other (non HMO) | Admitting: Physician Assistant

## 2022-08-12 ENCOUNTER — Other Ambulatory Visit (HOSPITAL_COMMUNITY): Payer: Self-pay

## 2022-08-12 ENCOUNTER — Ambulatory Visit (INDEPENDENT_AMBULATORY_CARE_PROVIDER_SITE_OTHER): Payer: Managed Care, Other (non HMO) | Admitting: Physician Assistant

## 2022-08-12 ENCOUNTER — Encounter (INDEPENDENT_AMBULATORY_CARE_PROVIDER_SITE_OTHER): Payer: Self-pay | Admitting: Physician Assistant

## 2022-08-12 VITALS — BP 122/81 | HR 77 | Temp 97.9°F | Ht <= 58 in | Wt 198.0 lb

## 2022-08-12 DIAGNOSIS — Z6841 Body Mass Index (BMI) 40.0 and over, adult: Secondary | ICD-10-CM

## 2022-08-12 DIAGNOSIS — F439 Reaction to severe stress, unspecified: Secondary | ICD-10-CM | POA: Diagnosis not present

## 2022-08-12 DIAGNOSIS — E559 Vitamin D deficiency, unspecified: Secondary | ICD-10-CM

## 2022-08-12 DIAGNOSIS — R7303 Prediabetes: Secondary | ICD-10-CM | POA: Diagnosis not present

## 2022-08-12 DIAGNOSIS — I10 Essential (primary) hypertension: Secondary | ICD-10-CM | POA: Diagnosis not present

## 2022-08-12 MED ORDER — TIRZEPATIDE-WEIGHT MANAGEMENT 5 MG/0.5ML ~~LOC~~ SOAJ
5.0000 mg | SUBCUTANEOUS | 0 refills | Status: DC
  Filled 2022-08-12 – 2022-08-27 (×3): qty 2, 28d supply, fill #0

## 2022-08-12 MED ORDER — BUPROPION HCL ER (SR) 150 MG PO TB12
150.0000 mg | ORAL_TABLET | Freq: Every day | ORAL | 0 refills | Status: DC
Start: 2022-08-12 — End: 2022-09-16
  Filled 2022-08-12 – 2022-08-30 (×5): qty 30, 30d supply, fill #0

## 2022-08-12 MED ORDER — VITAMIN D (ERGOCALCIFEROL) 1.25 MG (50000 UNIT) PO CAPS
50000.0000 [IU] | ORAL_CAPSULE | ORAL | 0 refills | Status: DC
Start: 2022-08-12 — End: 2022-09-16
  Filled 2022-08-12 – 2022-08-27 (×3): qty 4, 28d supply, fill #0

## 2022-08-12 NOTE — Progress Notes (Signed)
.smr  Office: [phone]  /  Fax: [phone]  WEIGHT SUMMARY AND BIOMETRICS  Vitals Temp: 97.9 F (36.6 C) BP: 122/81 Pulse Rate: 77 SpO2: 100 %   Anthropometric Measurements Height: 4\' 10"  (1.473 m) Weight: 198 lb (89.8 kg) BMI (Calculated): 41.39 Weight at Last Visit: 200 Weight Lost Since Last Visit: 2 lb Weight Gained Since Last Visit: 0 Starting Weight: 225 lb Total Weight Loss (lbs): 27 lb (12.2 kg) Peak Weight: 232 lb   Body Composition  Body Fat %: 44.9 % Fat Mass (lbs): 89.2 lbs Muscle Mass (lbs): 103.8 lbs Total Body Water (lbs): 75.4 lbs Visceral Fat Rating : 14   Other Clinical Data Fasting: yes Labs: no Today's Visit #: 38 Starting Date: 06/12/20     HPI  Chief Complaint: OBESITY  Alahna is here to discuss her progress with her obesity treatment plan. She is on the the Category 1 Plan and states she is following her eating plan approximately 50 % of the time. She states she is exercising walking 60 minutes 2-3 times per week.  Discussed the use of AI scribe software for clinical note transcription with the patient, who gave verbal consent to proceed.  History of Present Illness    Ingri Kata, a 57 year old patient with a history of obesity, vitamin D deficiency, emotional eating behavior, prediabetes, and hypertension, presents for a follow-up visit. She has been on Zepbound 5mg  weekly and reports a weight loss of two pounds over the past two weeks. She has been feeling generally well, but experienced some nausea and dehydration recently due to a busy schedule that disrupted her normal eating and drinking habits. She has been making an effort to increase her water intake to address the dehydration. The patient also reports some constipation, which she attributes to her disrupted eating and drinking habits. She is also on Wellbutrin 150mg  once a day, which she reports as helpful, and vitamin D supplements. The patient is considering joining a gym  for regular exercise as part of her ongoing obesity management.       Interval History:  Since last office visit she is down 2 pounds Down total of 27 pounds/ TBW loss 12%  Hunger/appetite-moderate control Cravings-denies excessive Stress-some increased work stress.  Also some stress with empty nest.  Freddie Apley is walking 60 minutes several times weekly but is also thinking about joining a gym and her husband may join with her as well Hydration-working on improving water intake.  We discussed she needs at least 64 to 80 ounces daily of fluids   Pharmacotherapy: Zepbound 5 mg weekly. Denies mass in neck, dysphagia, dyspepsia, persistent hoarseness, abdominal pain, or N/V/Constipation or diarrhea. Has annual eye exam. Mood is stable.  She did have 1 episode of nausea over the past several weeks but feels this was related to skipping meals while she was moving her daughter into a new apartment and not hydrating well enough. We discussed the importance of regular meals and adequate protein intake for weight loss and overall health.                Metformin for prediabetes. No GI side effects with metformin.   TREATMENT PLAN FOR OBESITY: Obesity Two pound weight loss over the past 2-3 weeks on Zepbound 5mg  weekly. Mild nausea and constipation reported, possibly related to inconsistent eating and hydration patterns. Discussed the importance of regular meals and hydration. -Continue Zepbound 5mg  weekly. -Encourage regular meals and increased hydration (64-80 ounces of fluid daily). -Consider protein shakes  for meal supplementation during busy periods. -Plan to reassess tolerance and effectiveness of Zepbound at next visit.  Recommended Dietary Goals  Miasia is currently in the action stage of change. As such, her goal is to continue weight management plan. She has agreed to the Category 1 Plan.  Behavioral Intervention  We discussed the following Behavioral Modification Strategies  today: increasing lean protein intake, decreasing simple carbohydrates , increasing vegetables, increasing lower glycemic fruits, avoiding skipping meals, increasing water intake, emotional eating strategies and understanding the difference between hunger signals and cravings, continue to practice mindfulness when eating, and planning for success.  Additional resources provided today: Prepared protein shake handout  Recommended Physical Activity Goals  [name] has been advised to work up to 150 minutes of moderate intensity aerobic activity a week and strengthening exercises 2-3 times per week for cardiovascular health, weight loss maintenance and preservation of muscle mass.   She has agreed to Continue current level of physical activity    Pharmacotherapy We discussed various medication options to help Kayleanna with her weight loss efforts and we both agreed to continue current dose Zepbound 5 mg weekly to promote weight loss    Return in about 5 weeks (around 09/16/2022).Marland Kitchen She was informed of the importance of frequent follow up visits to maximize her success with intensive li continue festyle modifications for her multiple health conditions.  PHYSICAL EXAM:  Blood pressure 122/81, pulse 77, temperature 97.9 F (36.6 C), height 4\' 10"  (1.473 m), weight 198 lb (89.8 kg), last menstrual period 01/24/2016, SpO2 100%. Body mass index is 41.38 kg/m.  General: She is overweight, cooperative, alert, well developed, and in no acute distress. PSYCH: Has normal mood, affect and thought process.   Cardiovascular: Heart rate 77 blood pressure 122/81 no tachycardia.  No peripheral edema. Lungs: Normal breathing effort, no conversational dyspnea. Neuro: No focal deficit  DIAGNOSTIC DATA REVIEWED:  BMET    Component Value Date/Time   NA 143 07/01/2022 0950   K 4.2 07/01/2022 0950   CL 105 07/01/2022 0950   CO2 25 07/01/2022 0950   GLUCOSE 89 07/01/2022 0950   GLUCOSE 96 03/30/2016 1041   BUN  11 07/01/2022 0950   CREATININE 0.92 07/01/2022 0950   CALCIUM 8.8 07/01/2022 0950   GFRNONAA 96 08/21/2018 0931   GFRAA 110 08/21/2018 0931   Lab Results  Component Value Date   HGBA1C 5.9 (H) 12/14/2021   HGBA1C 6.1 (H) 10/11/2017   Lab Results  Component Value Date   INSULIN 11.4 12/14/2021   INSULIN 13.2 06/12/2020   Lab Results  Component Value Date   TSH 0.80 12/14/2021   CBC    Component Value Date/Time   WBC 4.0 12/14/2021 0838   RBC 5.06 12/14/2021 0838   RBC 4.95 12/14/2021 0000   HGB 14.1 12/14/2021 0838   HCT 43.3 12/14/2021 0838   PLT 313 12/14/2021 0838   MCV 86 12/14/2021 0838   MCH 27.9 12/14/2021 0838   MCHC 32.6 12/14/2021 0838   RDW 13.8 12/14/2021 0838   Iron Studies No results found for: "IRON", "TIBC", "FERRITIN", "IRONPCTSAT" Lipid Panel     Component Value Date/Time   CHOL 172 12/14/2021 0838   TRIG 65 12/14/2021 0838   HDL 65 12/14/2021 0838   CHOLHDL 2.4 06/18/2021 0936   LDLCALC 94 12/14/2021 0838   Hepatic Function Panel     Component Value Date/Time   PROT 6.9 07/01/2022 0950   ALBUMIN 4.3 07/01/2022 0950   AST 15 07/01/2022 0950  ALT 15 07/01/2022 0950   ALKPHOS 78 07/01/2022 0950   BILITOT 0.3 07/01/2022 0950      Component Value Date/Time   TSH 0.80 12/14/2021 0000   TSH 1.300 06/18/2021 0936   Nutritional Lab Results  Component Value Date   VD25OH 46.9 07/01/2022   VD25OH 47.3 12/14/2021   VD25OH 51.5 12/14/2021    ASSOCIATED CONDITIONS ADDRESSED TODAY  ASSESSMENT AND PLAN  Problem List Items Addressed This Visit     Essential hypertension   Prediabetes - Primary   Vitamin D deficiency   Relevant Medications   Vitamin D, Ergocalciferol, (DRISDOL) 1.25 MG (50000 UNIT) CAPS capsule   Stress   Relevant Medications   buPROPion (WELLBUTRIN SR) 150 MG 12 hr tablet   BMI 40.0-44.9, adult (HCC)   Relevant Medications   tirzepatide (ZEPBOUND) 5 MG/0.5ML Pen   Morbid obesity (HCC)-START BMI 47.03   Relevant  Medications   tirzepatide (ZEPBOUND) 5 MG/0.5ML Pen  Prediabetes Last A1c was 5.5 from [employer] biometric data 08/05/2022 Medication(s): Zepbound 5.0 mg SQ weekly Some nausea when skipped meal. We discussed the importance of regular meals and adequate protein intake for weight loss and overall health. Otherwise- Denies mass in neck, dysphagia, dyspepsia, persistent hoarseness, abdominal pain, or N/V/Constipation or diarrhea. Has annual eye exam. Mood is stable.   Polyphagia:No She is working on nutrition plan to decrease simple carbohydrates, increase lean proteins and exercise to promote weight loss, improve glycemic control and prevent progression to Type 2 diabetes.   Lab Results  Component Value Date   HGBA1C 5.9 (H) 12/14/2021   HGBA1C 5.8 12/14/2021   HGBA1C 6.4 (H) 06/18/2021   HGBA1C 6.0 (H) 03/05/2021   HGBA1C 6.0 09/29/2020   Lab Results  Component Value Date   INSULIN 11.4 12/14/2021   INSULIN 16.5 06/18/2021   INSULIN 13.9 03/05/2021   INSULIN 13.2 06/12/2020    Plan: Continue and refill Zepbound 5.0 mg SQ weekly Continue working on nutrition plan to decrease simple carbohydrates, increase lean proteins and exercise to promote weight loss, improve glycemic control and prevent progression to Type 2 diabetes.  A1c improved with treatment with GLP-!/GIP medication, Zepbound.    Hypertension On Losartan and Amlodipine for blood pressure control. Recent creatinine level slightly elevated, possibly due to inadequate hydration. -Continue Losartan and Amlodipine. -Encourage increased hydration. -Monitor renal function regularly.  Vitamin D Deficiency Vitamin D is not at goal of 50.  Most recent vitamin D level was 46.9 07/01/2022. She is on  prescription ergocalciferol 50,000 IU weekly. No N/V or muscle weakness or other side effects with Ergocalciferol. Lab Results  Component Value Date   VD25OH 46.9 07/01/2022   VD25OH 47.3 12/14/2021   VD25OH 51.5 12/14/2021     Plan: Continue and refill  prescription ergocalciferol 50,000 IU weekly Low vitamin D levels can be associated with adiposity and may result in leptin resistance and weight gain. Also associated with fatigue. Currently on vitamin D supplementation without any adverse effects.  Recheck vitamin D levels several times yearly to optimize supplementation.    Stress/Mood disorder/emotional eating Raeden has had issues with stress/emotional eating. Currently this is well controlled. Overall mood is stable. Medication(s): Bupropion SR 150 mg daily in am  Plan: Continue and refill Bupropion SR 150 mg daily in am Discussed the potential benefits of regular physical activity and considering joining a gym. -Encourage exploration of local gyms and finding a suitable physical activity routine.   Follow-up plans -Next appointment scheduled for September 5th, with a  subsequent appointment on September 25th.  ATTESTASTION STATEMENTS:  Reviewed by clinician on day of visit: allergies, medications, problem list, medical history, surgical history, family history, social history, and previous encounter notes.   I have personally spent 45 minutes total time today in preparation, patient care, nutritional counseling and documentation for this visit, including the following: review of clinical lab tests; review of medical tests/procedures/services.      Monzerat Handler, PA-C

## 2022-08-16 ENCOUNTER — Other Ambulatory Visit (INDEPENDENT_AMBULATORY_CARE_PROVIDER_SITE_OTHER): Payer: Self-pay

## 2022-08-24 ENCOUNTER — Other Ambulatory Visit (HOSPITAL_COMMUNITY): Payer: Self-pay

## 2022-08-27 ENCOUNTER — Other Ambulatory Visit: Payer: Self-pay

## 2022-08-27 ENCOUNTER — Other Ambulatory Visit (HOSPITAL_COMMUNITY): Payer: Self-pay

## 2022-08-30 ENCOUNTER — Other Ambulatory Visit (HOSPITAL_COMMUNITY): Payer: Self-pay

## 2022-08-31 ENCOUNTER — Other Ambulatory Visit: Payer: Self-pay

## 2022-09-16 ENCOUNTER — Encounter (INDEPENDENT_AMBULATORY_CARE_PROVIDER_SITE_OTHER): Payer: Self-pay | Admitting: Physician Assistant

## 2022-09-16 ENCOUNTER — Other Ambulatory Visit (HOSPITAL_COMMUNITY): Payer: Self-pay

## 2022-09-16 ENCOUNTER — Ambulatory Visit (INDEPENDENT_AMBULATORY_CARE_PROVIDER_SITE_OTHER): Payer: Managed Care, Other (non HMO) | Admitting: Physician Assistant

## 2022-09-16 VITALS — BP 121/85 | HR 77 | Temp 98.1°F | Ht <= 58 in | Wt 194.0 lb

## 2022-09-16 DIAGNOSIS — E559 Vitamin D deficiency, unspecified: Secondary | ICD-10-CM | POA: Diagnosis not present

## 2022-09-16 DIAGNOSIS — Z6841 Body Mass Index (BMI) 40.0 and over, adult: Secondary | ICD-10-CM

## 2022-09-16 DIAGNOSIS — F439 Reaction to severe stress, unspecified: Secondary | ICD-10-CM

## 2022-09-16 DIAGNOSIS — R7303 Prediabetes: Secondary | ICD-10-CM

## 2022-09-16 MED ORDER — VITAMIN D (ERGOCALCIFEROL) 1.25 MG (50000 UNIT) PO CAPS
50000.0000 [IU] | ORAL_CAPSULE | ORAL | 0 refills | Status: DC
  Filled 2022-09-16 – 2022-09-28 (×3): qty 4, 28d supply, fill #0

## 2022-09-16 MED ORDER — BUPROPION HCL ER (SR) 150 MG PO TB12
150.0000 mg | ORAL_TABLET | Freq: Every day | ORAL | 0 refills | Status: DC
Start: 2022-09-16 — End: 2022-10-06
  Filled 2022-09-16 – 2022-09-28 (×3): qty 30, 30d supply, fill #0

## 2022-09-16 MED ORDER — TIRZEPATIDE-WEIGHT MANAGEMENT 7.5 MG/0.5ML ~~LOC~~ SOAJ
7.5000 mg | SUBCUTANEOUS | 0 refills | Status: DC
  Filled 2022-09-16: qty 2, 28d supply, fill #0

## 2022-09-16 NOTE — Progress Notes (Signed)
.smr  Office: [phone]  /  Fax: [phone]  WEIGHT SUMMARY AND BIOMETRICS  Vitals Temp: 98.1 F (36.7 C) BP: 121/85 Pulse Rate: 77 SpO2: 100 %   Anthropometric Measurements Height: 4\' 10"  (1.473 m) Weight: 194 lb (88 kg) BMI (Calculated): 40.56 Weight at Last Visit: 198 lb Weight Lost Since Last Visit: 4 lb Weight Gained Since Last Visit: 0 Starting Weight: 225 lb Total Weight Loss (lbs): 31 lb (14.1 kg) Peak Weight: 232 lb   Body Composition  Body Fat %: 45 % Fat Mass (lbs): 87.6 lbs Muscle Mass (lbs): 101.8 lbs Total Body Water (lbs): 73.6 lbs Visceral Fat Rating : 14   Other Clinical Data Fasting: yes Labs: no Today's Visit #: 39 Starting Date: 06/12/20     HPI  Chief Complaint: OBESITY  Alaziah is here to discuss her progress with her obesity treatment plan. She is on the the Category 1 Plan and states she is following her eating plan approximately 50-55 % of the time. She states she is exercising walking 1 mile 25 minutes 4-5 times per week.  Discussed the use of AI scribe software for clinical note transcription with the patient, who gave verbal consent to proceed.  History of Present Illness    The patient is currently on Zepbound for her obesity and Wellbutrin for her stress/emotional eating. She reports occasional nausea but no significant side effects from her medications. She is open to increasing the dose of Zepbound.  Interval History:  Since last office visit she is down 4 lbs.  Bio impedence scale reviewed with the patient:  Down 2 lbs muscle mass Down 1.6 lbs adipose mass   She reports overall good control of her appetite with occasional lapses. She has been making conscious efforts to choose healthier food options and limit her intake of unhealthy foods. She mentions an instance where she limited her consumption of doughnuts, which she identifies as a significant improvement from her previous eating habits.  In terms of physical  activity, the patient has been engaging in regular walking exercises around her neighborhood. She reports being able to complete her walking route in about 23-24 minutes. She also mentions increasing her activity around the house, including going up and down the stairs more frequently.  The patient also discusses her personal life, including her family and work situation. She mentions trying to be more relaxed at work and not letting things get to her as much. She also talks about her daughters and their current situations.  Has cruise to the [place] over the next week.   Pharmacotherapy: Zepbound 5 mg weekly. Denies mass in neck, dysphagia, dyspepsia, persistent hoarseness, abdominal pain, or N/V/Constipation or diarrhea. Has annual eye exam. Mood is stable.   Previously on metformin for prediabetes. Need to clarify dosing and if she is continuing to take metformin at this point as tolerating Zepbound well with significant improvement in A1c level .   TREATMENT PLAN FOR OBESITY: Obesity Patient has been adhering to dietary changes and has increased physical activity. She has been walking regularly and plans to join a gym when the weather gets colder. She has lost weight and her A1C has improved significantly from near-diabetic levels to 5.5. She is currently on Zepbound and Wellbutrin, which seem to be helping with appetite control and mood respectively. -Increase dose of Zepbound -Continue current diet and exercise regimen.  -Next appointment  Sept 25 th  Recommended Dietary Goals  Poleth is currently in the action stage of change. As  such, her goal is to continue weight management plan. She has agreed to the Category 1 Plan.  Behavioral Intervention  We discussed the following Behavioral Modification Strategies today: increasing lean protein intake, decreasing simple carbohydrates , increasing vegetables, increasing lower glycemic fruits, increasing fiber rich foods, avoiding skipping  meals, increasing water intake, emotional eating strategies and understanding the difference between hunger signals and cravings, work on managing stress, creating time for self-care and relaxation measures, continue to practice mindfulness when eating, planning for success, and staying on track while traveling and vacationing.  Additional resources provided today: NA  Recommended Physical Activity Goals  Doral has been advised to work up to 150 minutes of moderate intensity aerobic activity a week and strengthening exercises 2-3 times per week for cardiovascular health, weight loss maintenance and preservation of muscle mass.   She has agreed to Continue current level of physical activity    Pharmacotherapy We discussed various medication options to help Kasey with her weight loss efforts and we both agreed to increase Zepbound to 7.5 mg weekly and continue Wellbutrin for emotional eating/cravings.    Return in about 3 weeks (around 10/07/2022).Marland Kitchen She was informed of the importance of frequent follow up visits to maximize her success with intensive lifestyle modifications for her multiple health conditions.  PHYSICAL EXAM:  Blood pressure 121/85, pulse 77, temperature 98.1 F (36.7 C), height 4\' 10"  (1.473 m), weight 194 lb (88 kg), last menstrual period 01/24/2016, SpO2 100%. Body mass index is 40.55 kg/m.  General: She is overweight, cooperative, alert, well developed, and in no acute distress. PSYCH: Has normal mood, affect and thought process.   Cardiovascular: HR 70's regular, BP 121/85 Lungs: Normal breathing effort, no conversational dyspnea. Neuro: no focal deficits  DIAGNOSTIC DATA REVIEWED:  BMET    Component Value Date/Time   NA 143 07/01/2022 0950   K 4.2 07/01/2022 0950   CL 105 07/01/2022 0950   CO2 25 07/01/2022 0950   GLUCOSE 89 07/01/2022 0950   GLUCOSE 96 03/30/2016 1041   BUN 11 07/01/2022 0950   CREATININE 1.0 08/05/2022 0000   CREATININE 0.92  07/01/2022 0950   CALCIUM 8.8 07/01/2022 0950   GFRNONAA 96 08/21/2018 0931   GFRAA 110 08/21/2018 0931   Lab Results  Component Value Date   HGBA1C 5.5 08/05/2022   HGBA1C 6.1 (H) 10/11/2017   Lab Results  Component Value Date   INSULIN 11.4 12/14/2021   INSULIN 13.2 06/12/2020   Lab Results  Component Value Date   TSH 0.80 12/14/2021   CBC    Component Value Date/Time   WBC 4.0 12/14/2021 0838   RBC 5.06 12/14/2021 0838   RBC 4.95 12/14/2021 0000   HGB 14.1 12/14/2021 0838   HCT 43.3 12/14/2021 0838   PLT 313 12/14/2021 0838   MCV 86 12/14/2021 0838   MCH 27.9 12/14/2021 0838   MCHC 32.6 12/14/2021 0838   RDW 13.8 12/14/2021 0838   Iron Studies No results found for: "IRON", "TIBC", "FERRITIN", "IRONPCTSAT" Lipid Panel     Component Value Date/Time   CHOL 141 08/05/2022 0000   CHOL 172 12/14/2021 0838   TRIG 58 08/05/2022 0000   HDL 62 08/05/2022 0000   HDL 65 12/14/2021 0838   CHOLHDL 2.4 06/18/2021 0936   LDLCALC 67 08/05/2022 0000   LDLCALC 94 12/14/2021 0838   Hepatic Function Panel     Component Value Date/Time   PROT 6.9 07/01/2022 0950   ALBUMIN 4.3 07/01/2022 0950   AST 15 07/01/2022  0950   ALT 15 07/01/2022 0950   ALKPHOS 78 07/01/2022 0950   BILITOT 0.3 07/01/2022 0950      Component Value Date/Time   TSH 0.80 12/14/2021 0000   TSH 1.300 06/18/2021 0936   Nutritional Lab Results  Component Value Date   VD25OH 46.9 07/01/2022   VD25OH 47.3 12/14/2021   VD25OH 51.5 12/14/2021    ASSOCIATED CONDITIONS ADDRESSED TODAY  ASSESSMENT AND PLAN  Problem List Items Addressed This Visit     Prediabetes - Primary   Vitamin D deficiency   Relevant Medications   Vitamin D, Ergocalciferol, (DRISDOL) 1.25 MG (50000 UNIT) CAPS capsule   Stress   Relevant Medications   buPROPion (WELLBUTRIN SR) 150 MG 12 hr tablet   BMI 40.0-44.9, adult (HCC)   Relevant Medications   tirzepatide (ZEPBOUND) 7.5 MG/0.5ML Pen   Morbid obesity (HCC)-START BMI  47.03   Relevant Medications   tirzepatide (ZEPBOUND) 7.5 MG/0.5ML Pen   Prediabetes Last A1c was 5.5- at goal and much improved!   Medication(s): Zepbound 5.0 mg SQ weekly Denies mass in neck, dysphagia, dyspepsia, persistent hoarseness, abdominal pain, or N/V/Constipation or diarrhea. Has annual eye exam. Mood is stable.  Polyphagia:No Lab Results  Component Value Date   HGBA1C 5.5 08/05/2022   HGBA1C 5.8 05/26/2022   HGBA1C 5.9 (H) 12/14/2021   HGBA1C 5.8 12/14/2021   HGBA1C 6.4 (H) 06/18/2021   Lab Results  Component Value Date   INSULIN 11.4 12/14/2021   INSULIN 16.5 06/18/2021   INSULIN 13.9 03/05/2021   INSULIN 13.2 06/12/2020    Plan: Continue and increase dose Zepbound 7.5 mg SQ weekly Continue working on nutrition plan to decrease simple carbohydrates, increase lean proteins and exercise to promote weight loss, improve glycemic control and prevent progression to Type 2 diabetes.   Vitamin D Deficiency Vitamin D is not at goal of 50.  Most recent vitamin D level was 46.9. She is on  prescription ergocalciferol 50,000 IU weekly. Lab Results  Component Value Date   VD25OH 46.9 07/01/2022   VD25OH 47.3 12/14/2021   VD25OH 51.5 12/14/2021    Plan: Continue and refill  prescription ergocalciferol 50,000 IU weekly Low vitamin D levels can be associated with adiposity and may result in leptin resistance and weight gain. Also associated with fatigue. Currently on vitamin D supplementation without any adverse effects.  Recheck vitamin D level in 2-3 months to optimize supplementation/avoid over supplementation.   Stress/Mood disorder/emotional eating  Jevonne has had issues with stress/emotional eating. Currently this is well controlled. Overall mood is stable. Medication(s): Bupropion SR 150 mg daily in am  Plan: Continue and refill Bupropion SR 150 mg daily in am Continue to work on emotional eating strategies/stress reduction.    ATTESTASTION  STATEMENTS:  Reviewed by clinician on day of visit: allergies, medications, problem list, medical history, surgical history, family history, social history, and previous encounter notes.   I have personally spent 44 minutes total time today in preparation, patient care, nutritional counseling and documentation for this visit, including the following: review of clinical lab tests; review of medical tests/procedures/services.      Renne Cornick, PA-C

## 2022-09-28 ENCOUNTER — Other Ambulatory Visit (HOSPITAL_COMMUNITY): Payer: Self-pay

## 2022-10-06 ENCOUNTER — Encounter (INDEPENDENT_AMBULATORY_CARE_PROVIDER_SITE_OTHER): Payer: Self-pay | Admitting: Physician Assistant

## 2022-10-06 ENCOUNTER — Ambulatory Visit (INDEPENDENT_AMBULATORY_CARE_PROVIDER_SITE_OTHER): Payer: Managed Care, Other (non HMO) | Admitting: Physician Assistant

## 2022-10-06 ENCOUNTER — Other Ambulatory Visit (HOSPITAL_COMMUNITY): Payer: Self-pay

## 2022-10-06 ENCOUNTER — Encounter (INDEPENDENT_AMBULATORY_CARE_PROVIDER_SITE_OTHER): Payer: Self-pay | Admitting: Family Medicine

## 2022-10-06 VITALS — BP 110/77 | HR 88 | Temp 98.4°F | Ht <= 58 in | Wt 190.0 lb

## 2022-10-06 DIAGNOSIS — E669 Obesity, unspecified: Secondary | ICD-10-CM

## 2022-10-06 DIAGNOSIS — Z6839 Body mass index (BMI) 39.0-39.9, adult: Secondary | ICD-10-CM | POA: Insufficient documentation

## 2022-10-06 DIAGNOSIS — E559 Vitamin D deficiency, unspecified: Secondary | ICD-10-CM | POA: Diagnosis not present

## 2022-10-06 DIAGNOSIS — R632 Polyphagia: Secondary | ICD-10-CM | POA: Diagnosis not present

## 2022-10-06 DIAGNOSIS — F439 Reaction to severe stress, unspecified: Secondary | ICD-10-CM

## 2022-10-06 DIAGNOSIS — Z6838 Body mass index (BMI) 38.0-38.9, adult: Secondary | ICD-10-CM | POA: Insufficient documentation

## 2022-10-06 MED ORDER — TIRZEPATIDE-WEIGHT MANAGEMENT 7.5 MG/0.5ML ~~LOC~~ SOAJ
7.5000 mg | SUBCUTANEOUS | 0 refills | Status: DC
  Filled 2022-10-06 – 2022-10-11 (×4): qty 2, 28d supply, fill #0

## 2022-10-06 MED ORDER — BUPROPION HCL ER (SR) 150 MG PO TB12
150.0000 mg | ORAL_TABLET | Freq: Every day | ORAL | 0 refills | Status: DC
Start: 2022-10-06 — End: 2022-11-04
  Filled 2022-10-06 – 2022-10-20 (×4): qty 30, 30d supply, fill #0

## 2022-10-06 MED ORDER — VITAMIN D (ERGOCALCIFEROL) 1.25 MG (50000 UNIT) PO CAPS
50000.0000 [IU] | ORAL_CAPSULE | ORAL | 0 refills | Status: DC
  Filled 2022-10-06 – 2022-10-20 (×4): qty 4, 28d supply, fill #0

## 2022-10-06 NOTE — Progress Notes (Signed)
.smr  Office: [phone]  /  Fax: [phone]  WEIGHT SUMMARY AND BIOMETRICS  Vitals Temp: 98.4 F (36.9 C) BP: 110/77 Pulse Rate: 88 SpO2: 100 %   Anthropometric Measurements Height: 4\' 10"  (1.473 m) Weight: 190 lb (86.2 kg) BMI (Calculated): 39.72 Weight at Last Visit: 194 lb Weight Lost Since Last Visit: 4 lb Weight Gained Since Last Visit: 0 Starting Weight: 225 lb Total Weight Loss (lbs): 35 lb (15.9 kg) Peak Weight: 232 lb   Body Composition  Body Fat %: 43.3 % Fat Mass (lbs): 82.6 lbs Muscle Mass (lbs): 102.8 lbs Total Body Water (lbs): 71 lbs Visceral Fat Rating : 13   Other Clinical Data Fasting: yes Labs: no Today's Visit #: 40 Starting Date: 06/12/20     HPI  Chief Complaint: OBESITY  Mary Odonnell is here to discuss her progress with her obesity treatment plan. She is on the the Category 1 Plan and states she is following her eating plan approximately 45 % of the time. She states she is exercising walking 30 minutes 3 times per week.   Interval History:  Since last office visit she is down 4 lbs.  Bio impedence scale reviewed with the patient:  Up 1 lb muscle mass Down 5 lbs adipose mass  The patient, with a history of obesity and hypertension, presents for a follow-up visit. She reports maintaining her diet and exercise regimen, even during a recent cruise trip. She notes that she has been experiencing some nausea and headaches, particularly after consuming sweet foods at night. She has been trying to avoid sweet foods, especially before bedtime, which seems to help. She also reports experiencing some numbness in her arm and hip, particularly at night. She is unsure if this is due to her sleeping position or another underlying issue. She plans to discuss this with her primary care physician during her next visit. The patient also mentions that she has been dealing with the emotional stress of her friend's deteriorating health due to  cancer.  Pharmacotherapy: Zepbound 7.5 mg weekly. Denies mass in neck, dysphagia, dyspepsia, persistent hoarseness, abdominal pain, or N/V/Constipation or diarrhea. Has annual eye exam. Mood is stable.   TREATMENT PLAN FOR OBESITY: Down 35 lbs overall TBW loss of 15.6% !  Continued progress with weight loss and muscle gain. Noted occasional nausea and headaches, possibly related to dietary choices. -Continue current diet and exercise regimen. -Consider adding strength training exercises 2-3 times per week. Recommended Dietary Goals  [name] is currently in the action stage of change. As such, her goal is to continue weight management plan. She has agreed to the Category 1 Plan.  Behavioral Intervention  We discussed the following Behavioral Modification Strategies today: increasing lean protein intake, decreasing simple carbohydrates , increasing vegetables, increasing lower glycemic fruits, increasing fiber rich foods, avoiding skipping meals, increasing water intake, decreasing eating out or consumption of processed foods, and making healthy choices when eating convenient foods, emotional eating strategies and understanding the difference between hunger signals and cravings, work on managing stress, creating time for self-care and relaxation measures, continue to practice mindfulness when eating, and planning for success.  Additional resources provided today: NA  Recommended Physical Activity Goals  Teresea has been advised to work up to 150 minutes of moderate intensity aerobic activity a week and strengthening exercises 2-3 times per week for cardiovascular health, weight loss maintenance and preservation of muscle mass.   She has agreed to Continue current level of physical activity  and Start strengthening exercises  with a goal of 2-3 sessions a week    Pharmacotherapy We discussed various medication options to help Trica with her weight loss efforts and we both agreed to continue  Zepbound 7.5 mg weekly.    Return in about 4 weeks (around 11/03/2022).Marland Kitchen She was informed of the importance of frequent follow up visits to maximize her success with intensive lifestyle modifications for her multiple health conditions.  PHYSICAL EXAM:  Blood pressure 110/77, pulse 88, temperature 98.4 F (36.9 C), height 4\' 10"  (1.473 m), weight 190 lb (86.2 kg), last menstrual period 01/24/2016, SpO2 100%. Body mass index is 39.71 kg/m.  General: She is overweight, cooperative, alert, well developed, and in no acute distress. PSYCH: Has normal mood, affect and thought process.   Cardiovascular: HR 80's BP 110/77 Lungs: Normal breathing effort, no conversational dyspnea. Neuro: no focal deficits.   DIAGNOSTIC DATA REVIEWED:  BMET    Component Value Date/Time   NA 143 07/01/2022 0950   K 4.2 07/01/2022 0950   CL 105 07/01/2022 0950   CO2 25 07/01/2022 0950   GLUCOSE 89 07/01/2022 0950   GLUCOSE 96 03/30/2016 1041   BUN 11 07/01/2022 0950   CREATININE 1.0 08/05/2022 0000   CREATININE 0.92 07/01/2022 0950   CALCIUM 8.8 07/01/2022 0950   GFRNONAA 96 08/21/2018 0931   GFRAA 110 08/21/2018 0931   Lab Results  Component Value Date   HGBA1C 5.5 08/05/2022   HGBA1C 6.1 (H) 10/11/2017   Lab Results  Component Value Date   INSULIN 11.4 12/14/2021   INSULIN 13.2 06/12/2020   Lab Results  Component Value Date   TSH 0.80 12/14/2021   CBC    Component Value Date/Time   WBC 4.0 12/14/2021 0838   RBC 5.06 12/14/2021 0838   RBC 4.95 12/14/2021 0000   HGB 14.1 12/14/2021 0838   HCT 43.3 12/14/2021 0838   PLT 313 12/14/2021 0838   MCV 86 12/14/2021 0838   MCH 27.9 12/14/2021 0838   MCHC 32.6 12/14/2021 0838   RDW 13.8 12/14/2021 0838   Iron Studies No results found for: "IRON", "TIBC", "FERRITIN", "IRONPCTSAT" Lipid Panel     Component Value Date/Time   CHOL 141 08/05/2022 0000   CHOL 172 12/14/2021 0838   TRIG 58 08/05/2022 0000   HDL 62 08/05/2022 0000   HDL 65  12/14/2021 0838   CHOLHDL 2.4 06/18/2021 0936   LDLCALC 67 08/05/2022 0000   LDLCALC 94 12/14/2021 0838   Hepatic Function Panel     Component Value Date/Time   PROT 6.9 07/01/2022 0950   ALBUMIN 4.3 07/01/2022 0950   AST 15 07/01/2022 0950   ALT 15 07/01/2022 0950   ALKPHOS 78 07/01/2022 0950   BILITOT 0.3 07/01/2022 0950      Component Value Date/Time   TSH 0.80 12/14/2021 0000   TSH 1.300 06/18/2021 0936   Nutritional Lab Results  Component Value Date   VD25OH 46.9 07/01/2022   VD25OH 47.3 12/14/2021   VD25OH 51.5 12/14/2021    ASSOCIATED CONDITIONS ADDRESSED TODAY  ASSESSMENT AND PLAN  Problem List Items Addressed This Visit     Vitamin D deficiency   Relevant Medications   Vitamin D, Ergocalciferol, (DRISDOL) 1.25 MG (50000 UNIT) CAPS capsule   Stress   Relevant Medications   buPROPion (WELLBUTRIN SR) 150 MG 12 hr tablet   Obesity, Beginning BMI 47   Relevant Medications   tirzepatide (ZEPBOUND) 7.5 MG/0.5ML Pen   Polyphagia - Primary   BMI 39.0-39.9,adult Current BMI 39.9  Polyphagia Currently this is well controlled. Medication(s): Zepbound 7.5 mg SQ weekly.  Reported side effects: None  Plan: Continue and refill Zepbound 7.5 mg SQ weekly Continue to work on nutritional and behavioral strategies to promote weight loss.    Hypertension Blood pressure well controlled at 110/77. Currently on Norvasc 10mg , Cozaar 100mg , and Lasix (furosemide) 20 mg daily. -Continue current medication regimen. -Consider potential future reduction in Norvasc and Lasix if blood pressure remains well controlled.Continue to work on nutrition plan to promote weight loss and improve BP control.    Vitamin D Deficiency Vitamin D is not at goal of 50.  Most recent vitamin D level was 46.9. She is on  prescription ergocalciferol 50,000 IU weekly. Lab Results  Component Value Date   VD25OH 46.9 07/01/2022   VD25OH 47.3 12/14/2021   VD25OH 51.5 12/14/2021     Plan: Continue and refill  prescription ergocalciferol 50,000 IU weekly Low vitamin D levels can be associated with adiposity and may result in leptin resistance and weight gain. Also associated with fatigue. Currently on vitamin D supplementation without any adverse effects.  Recheck levels in November.   Stress:  Cleone has had issues with stress/emotional eating. Currently this is well controlled. Overall mood is stable. Medication(s): Bupropion SR 150 mg daily in am  Plan: Continue and refill Bupropion SR 150 mg daily in am Continue to work on emotional eating strategies.   General Health Maintenance -Plan to conduct labs in November.    Follow-up in 1 month. ATTESTASTION STATEMENTS:  Reviewed by clinician on day of visit: allergies, medications, problem list, medical history, surgical history, family history, social history, and previous encounter notes.   I have personally spent 30 minutes total time today in preparation, patient care, nutritional counseling and documentation for this visit, including the following: review of clinical lab tests; review of medical tests/procedures/services.      Latica Hohmann, PA-C

## 2022-10-11 ENCOUNTER — Other Ambulatory Visit: Payer: Self-pay

## 2022-10-20 ENCOUNTER — Other Ambulatory Visit (HOSPITAL_COMMUNITY): Payer: Self-pay

## 2022-10-21 ENCOUNTER — Other Ambulatory Visit: Payer: Self-pay

## 2022-10-25 ENCOUNTER — Telehealth (INDEPENDENT_AMBULATORY_CARE_PROVIDER_SITE_OTHER): Payer: Self-pay

## 2022-10-25 NOTE — Telephone Encounter (Signed)
See my chart message

## 2022-10-27 NOTE — Telephone Encounter (Signed)
Call to patient to ask her which appointment she would like to attend.  No answer, left voicemail.

## 2022-11-01 ENCOUNTER — Encounter: Payer: Self-pay | Admitting: Dermatology

## 2022-11-01 ENCOUNTER — Ambulatory Visit: Payer: Managed Care, Other (non HMO) | Admitting: Dermatology

## 2022-11-01 VITALS — BP 112/72 | HR 83

## 2022-11-01 DIAGNOSIS — N923 Ovulation bleeding: Secondary | ICD-10-CM | POA: Insufficient documentation

## 2022-11-01 DIAGNOSIS — R197 Diarrhea, unspecified: Secondary | ICD-10-CM | POA: Insufficient documentation

## 2022-11-01 DIAGNOSIS — N920 Excessive and frequent menstruation with regular cycle: Secondary | ICD-10-CM | POA: Insufficient documentation

## 2022-11-01 DIAGNOSIS — M940 Chondrocostal junction syndrome [Tietze]: Secondary | ICD-10-CM | POA: Insufficient documentation

## 2022-11-01 DIAGNOSIS — Z808 Family history of malignant neoplasm of other organs or systems: Secondary | ICD-10-CM

## 2022-11-01 DIAGNOSIS — R1031 Right lower quadrant pain: Secondary | ICD-10-CM | POA: Insufficient documentation

## 2022-11-01 DIAGNOSIS — M25551 Pain in right hip: Secondary | ICD-10-CM | POA: Insufficient documentation

## 2022-11-01 DIAGNOSIS — K5730 Diverticulosis of large intestine without perforation or abscess without bleeding: Secondary | ICD-10-CM | POA: Insufficient documentation

## 2022-11-01 DIAGNOSIS — L810 Postinflammatory hyperpigmentation: Secondary | ICD-10-CM | POA: Diagnosis not present

## 2022-11-01 DIAGNOSIS — L720 Epidermal cyst: Secondary | ICD-10-CM

## 2022-11-01 DIAGNOSIS — L729 Follicular cyst of the skin and subcutaneous tissue, unspecified: Secondary | ICD-10-CM

## 2022-11-01 DIAGNOSIS — R32 Unspecified urinary incontinence: Secondary | ICD-10-CM | POA: Insufficient documentation

## 2022-11-01 DIAGNOSIS — R42 Dizziness and giddiness: Secondary | ICD-10-CM | POA: Insufficient documentation

## 2022-11-01 MED ORDER — SAFETY SEAL MISCELLANEOUS MISC: 1.0000 | Freq: Every evening | 8 refills | Status: DC

## 2022-11-01 NOTE — Progress Notes (Signed)
   New Patient Visit   Subjective  Mary Odonnell is a 57 y.o. female who presents for the following: Vulvar Cyst  Patient states she has Cyst located at the genitalia that she would like to have examined. Patient reports the areas have been there for 2 years. She reports the areas are not bothersome.Patient rates irritation 0 out of 10. She states that the areas have not spread. Patient reports she has not previously been treated for these areas. Patient denies Hx of bx. Patient reports family history of skin cancer(s)(Mom).  The following portions of the chart were reviewed this encounter and updated as appropriate: medications, allergies, medical history  Review of Systems:  No other skin or systemic complaints except as noted in HPI or Assessment and Plan.  Objective  Well appearing patient in no apparent distress; mood and affect are within normal limits.   A focused examination was performed of the following areas: Vulvar  Relevant exam findings are noted in the Assessment and Plan.    Assessment & Plan   Epidermal Incisional CYSTs Exam: Subcutaneous nodules on labia majora  Treatment Plan: - Benign-appearing.  - Discussed that a cyst is a benign growth that can grow over time and sometimes get irritated or inflamed.  - Recommend observation if it is not bothersome.  - Discussed option of surgical excision to remove it if it is growing, symptomatic, or other changes noted. Please call for new or changing lesions so they can be evaluated. - Recommended Washing daily with Cerave 4% BPO  POST-INFLAMMATORY HYPERPIGMENTATION (PIH) Exam: hyperpigmented macules and/or patches at chin   This is a benign condition that comes from having previous inflammation in the skin and will fade with time over months to sometimes years. Recommend daily sun protection including sunscreen SPF 30+ to sun-exposed areas. - Recommend treating any itchy or red areas on the skin quickly to prevent new  areas of PIH. Treating with prescription medicines such as hydroquinone may help fade dark spots faster.    Treatment Plan:  - We will plan to prescribe Melaxemic Cream sent to [facility] - We will plan to follow up in 6 months        No follow-ups on file.    Documentation: I have reviewed the above documentation for accuracy and completeness, and I agree with the above.  Stasia Cavalier, am acting as scribe for Langston Reusing, DO.  Langston Reusing, DO

## 2022-11-01 NOTE — Addendum Note (Signed)
Addended byWaynetta Pean on: 11/01/2022 04:39 PM   Modules accepted: Orders

## 2022-11-01 NOTE — Patient Instructions (Addendum)
Hello Mary Odonnell,  Thank you for visiting [place] today. Your dedication to addressing your health concerns, particularly regarding the cysts, is greatly appreciated. 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 This is to help prevent infection.   - Application: Use the wash on the affected areas during your routine skin care regimen.   - Frequency: Three to four times a week.  Hyperpigmentation - Medication for Neck: Start using the prescribed compound cream from [facility] that contains a retinoid and lightening ingredients.   - Application: Apply a small amount, about the size of a chocolate chip, to the affected area every other night.   - Aftercare: Follow with a moisturizer to prevent dryness and irritation.  - Hair Removal: Consider undergoing electrolysis to prevent hair regrowth and reduce the risk of further skin issues.  - Follow-Up: We will schedule a follow-up appointment for the spring to monitor your progress and possibly adjust your treatment.  - Educational Material: You will receive an after-visit summary with all the discussed information.  We are here to support you through your journey to better health. If you have any questions or concerns before your next visit, please do not hesitate to contact our office.  Warm regards,  Dr. Langston Reusing,  Dermatology  Important Information   Due to recent changes in healthcare laws, you may see results of your pathology and/or laboratory studies on MyChart before the doctors have had a chance to review them. We understand that in some cases there may be results that are confusing or concerning to you. Please understand that not all results are received at the same time and often the doctors may need to interpret multiple results in order to provide you with the  best plan of care or course of treatment. Therefore, we ask that you please give [place] 2 business days to thoroughly review all your results before contacting the office for clarification. Should we see a critical lab result, you will be contacted sooner.     If You Need Anything After Your Visit   If you have any questions or concerns for your doctor, please call our main line at [phone]. If no one answers, please leave a voicemail as directed and we will return your call as soon as possible. Messages left after 4 pm will be answered the following business day.    You may also send [place] a message via MyChart. We typically respond to MyChart messages within 1-2 business days.  For prescription refills, please ask your pharmacy to contact our office. Our fax number is [phone].  If you have an urgent issue when the clinic is closed that cannot wait until the next business day, you can page your doctor at the number below.     Please note that while we do our best to be available for urgent issues outside of office hours, we are not available 24/7.    If you have an urgent issue and are unable to reach [place], you may choose to seek medical care at your doctor's office, retail clinic, urgent care center, or emergency room.   If you have a medical emergency, please immediately call 911 or go to the emergency department. In the event of inclement weather, please call our main line at [phone] for an update on the status  of any delays or closures.  Dermatology Medication Tips: Please keep the boxes that topical medications come in in order to help keep track of the instructions about where and how to use these. Pharmacies typically print the medication instructions only on the boxes and not directly on the medication tubes.   If your medication is too expensive, please contact our office at [phone] or send [place] a message through MyChart.    We are unable to tell what your co-pay for  medications will be in advance as this is different depending on your insurance coverage. However, we may be able to find a substitute medication at lower cost or fill out paperwork to get insurance to cover a needed medication.    If a prior authorization is required to get your medication covered by your insurance company, please allow [place] 1-2 business days to complete this process.   Drug prices often vary depending on where the prescription is filled and some pharmacies may offer cheaper prices.   The website www.goodrx.com contains coupons for medications through different pharmacies. The prices here do not account for what the cost may be with help from insurance (it may be cheaper with your insurance), but the website can give you the price if you did not use any insurance.  - You can print the associated coupon and take it with your prescription to the pharmacy.  - You may also stop by our office during regular business hours and pick up a GoodRx coupon card.  - If you need your prescription sent electronically to a different pharmacy, notify our office through [facility] or by phone at [phone]

## 2022-11-04 ENCOUNTER — Encounter (INDEPENDENT_AMBULATORY_CARE_PROVIDER_SITE_OTHER): Payer: Self-pay | Admitting: Physician Assistant

## 2022-11-04 ENCOUNTER — Ambulatory Visit (INDEPENDENT_AMBULATORY_CARE_PROVIDER_SITE_OTHER): Payer: Managed Care, Other (non HMO) | Admitting: Physician Assistant

## 2022-11-04 ENCOUNTER — Other Ambulatory Visit (HOSPITAL_COMMUNITY): Payer: Self-pay

## 2022-11-04 VITALS — BP 121/86 | HR 78 | Temp 98.1°F | Ht <= 58 in | Wt 189.0 lb

## 2022-11-04 DIAGNOSIS — F439 Reaction to severe stress, unspecified: Secondary | ICD-10-CM

## 2022-11-04 DIAGNOSIS — R7303 Prediabetes: Secondary | ICD-10-CM | POA: Diagnosis not present

## 2022-11-04 DIAGNOSIS — E559 Vitamin D deficiency, unspecified: Secondary | ICD-10-CM | POA: Diagnosis not present

## 2022-11-04 DIAGNOSIS — Z6839 Body mass index (BMI) 39.0-39.9, adult: Secondary | ICD-10-CM

## 2022-11-04 MED ORDER — TIRZEPATIDE-WEIGHT MANAGEMENT 7.5 MG/0.5ML ~~LOC~~ SOAJ
7.5000 mg | SUBCUTANEOUS | 0 refills | Status: DC
  Filled 2022-11-04: qty 2, 28d supply, fill #0

## 2022-11-04 MED ORDER — BUPROPION HCL ER (SR) 150 MG PO TB12
150.0000 mg | ORAL_TABLET | Freq: Every day | ORAL | 0 refills | Status: DC
Start: 2022-11-04 — End: 2022-12-02

## 2022-11-04 MED ORDER — VITAMIN D (ERGOCALCIFEROL) 1.25 MG (50000 UNIT) PO CAPS
50000.0000 [IU] | ORAL_CAPSULE | ORAL | 0 refills | Status: DC
Start: 2022-11-04 — End: 2022-12-02

## 2022-11-04 NOTE — Progress Notes (Signed)
.smr  Office: [phone]  /  Fax: [phone]  WEIGHT SUMMARY AND BIOMETRICS  Vitals Temp: 98.1 F (36.7 C) BP: 121/86 Pulse Rate: 78 SpO2: 100 %   Anthropometric Measurements Height: 4\' 10"  (1.473 m) Weight: 189 lb (85.7 kg) BMI (Calculated): 39.51 Weight at Last Visit: 190 lb Weight Lost Since Last Visit: 1 lb Weight Gained Since Last Visit: 0 Starting Weight: 225 lb Total Weight Loss (lbs): 36 lb (16.3 kg) Peak Weight: 232 lb   Body Composition  Body Fat %: 42.3 % Fat Mass (lbs): 80 lbs Muscle Mass (lbs): 103.4 lbs Total Body Water (lbs): 70.8 lbs Visceral Fat Rating : 13   Other Clinical Data Fasting: yes Labs: no Today's Visit #: 41 Starting Date: 06/12/20     HPI  Chief Complaint: OBESITY  Mary Odonnell is here to discuss her progress with her obesity treatment plan. She is on the the Category 1 Plan and states she is following her eating plan approximately 50 % of the time. She states she is exercising walking 25 minutes 3-4 times per week.  Discussed the use of AI scribe software for clinical note transcription with the patient, who gave verbal consent to proceed.  History of Present Illness  /   Interval History:  Since last office visit she is down 1 lb.  Muscle mass + 0.6 lbs Adipose mass - 2.6 lbs  Total weight loss of 36 lbs.  TBW loss of 16% !! The patient, with a history of obesity, vitamin D deficiency, prediabetes, and hypertension, presents for a follow-up visit.  She reports increased stress due to work, which has led to challenges in maintaining a healthy diet.  The patient has identified stress as a trigger for unhealthy eating habits, particularly late-night and comfort eating.  She has implemented strategies to manage this, including engaging in puzzles and reading, and maintaining a diet rich in fruits and fiber.  The patient also reports regular physical activity in the form of walking. She has noticed a significant improvement in her  clothing size, indicating weight loss.    Has appointment with PCP on 11/22/22 for AWV.  Pharmacotherapy: Zepbound 7.5 mg weekly. Denies mass in neck, dysphagia, dyspepsia, persistent hoarseness, abdominal pain, or N/V/Constipation or diarrhea. Has annual eye exam. Mood is stable.   TREATMENT PLAN FOR OBESITY: Obesity Significant progress with weight loss (36 lbs overall) and muscle mass gain.  Patient is managing stress and avoiding comfort eating. She is also engaging in regular physical activity with her husband. Currently on Zepbound 7.5mg  and Wellbutrin 150mg . -Continue current regimen of Zepbound 7.5mg  and Wellbutrin 150mg . -Consider increasing Zepbound dose if cravings become unmanageable. -Continue regular physical activity and consider adding ankle weights for strength training. -Next appointment in 4 weeks (12/02/2022). Recommended Dietary Goals  [name] is currently in the action stage of change. As such, her goal is to continue weight management plan. She has agreed to the Category 1 Plan.  Behavioral Intervention  We discussed the following Behavioral Modification Strategies today: continue to work on maintaining a reduced calorie state, getting the recommended amount of protein, incorporating whole foods, making healthy choices, staying well hydrated and practicing mindfulness when eating..  Additional resources provided today: NA  Recommended Physical Activity Goals  [name] has been advised to work up to 150 minutes of moderate intensity aerobic activity a week and strengthening exercises 2-3 times per week for cardiovascular health, weight loss maintenance and preservation of muscle mass.   She has agreed to Continue current  level of physical activity  and Increase physical activity in their day and reduce sedentary time (increase NEAT).   Pharmacotherapy We discussed various medication options to help [place] with her weight loss efforts and we both agreed to continue  Zepbound 7.5 mg weekly for medical weight loss.    Return in about 4 weeks (around 12/02/2022).Marland Kitchen She was informed of the importance of frequent follow up visits to maximize her success with intensive lifestyle modifications for her multiple health conditions.  PHYSICAL EXAM:  Blood pressure 121/86, pulse 78, temperature 98.1 F (36.7 C), height 4\' 10"  (1.473 m), weight 189 lb (85.7 kg), last menstrual period 01/24/2016, SpO2 100%. Body mass index is 39.5 kg/m.  General: She is overweight, cooperative, alert, well developed, and in no acute distress. PSYCH: Has normal mood, affect and thought process.   Cardiovascular: HR 70's BP 121/86 Lungs: Normal breathing effort, no conversational dyspnea. Neuro: no focal deficits  DIAGNOSTIC DATA REVIEWED:  BMET    Component Value Date/Time   NA 143 07/01/2022 0950   K 4.2 07/01/2022 0950   CL 105 07/01/2022 0950   CO2 25 07/01/2022 0950   GLUCOSE 89 07/01/2022 0950   GLUCOSE 96 03/30/2016 1041   BUN 11 07/01/2022 0950   CREATININE 1.0 08/05/2022 0000   CREATININE 0.92 07/01/2022 0950   CALCIUM 8.8 07/01/2022 0950   GFRNONAA 96 08/21/2018 0931   GFRAA 110 08/21/2018 0931   Lab Results  Component Value Date   HGBA1C 5.5 08/05/2022   HGBA1C 6.1 (H) 10/11/2017   Lab Results  Component Value Date   INSULIN 11.4 12/14/2021   INSULIN 13.2 06/12/2020   Lab Results  Component Value Date   TSH 0.80 12/14/2021   CBC    Component Value Date/Time   WBC 4.0 12/14/2021 0838   RBC 5.06 12/14/2021 0838   RBC 4.95 12/14/2021 0000   HGB 14.1 12/14/2021 0838   HCT 43.3 12/14/2021 0838   PLT 313 12/14/2021 0838   MCV 86 12/14/2021 0838   MCH 27.9 12/14/2021 0838   MCHC 32.6 12/14/2021 0838   RDW 13.8 12/14/2021 0838   Iron Studies No results found for: "IRON", "TIBC", "FERRITIN", "IRONPCTSAT" Lipid Panel     Component Value Date/Time   CHOL 141 08/05/2022 0000   CHOL 172 12/14/2021 0838   TRIG 58 08/05/2022 0000   HDL 62  08/05/2022 0000   HDL 65 12/14/2021 0838   CHOLHDL 2.4 06/18/2021 0936   LDLCALC 67 08/05/2022 0000   LDLCALC 94 12/14/2021 0838   Hepatic Function Panel     Component Value Date/Time   PROT 6.9 07/01/2022 0950   ALBUMIN 4.3 07/01/2022 0950   AST 15 07/01/2022 0950   ALT 15 07/01/2022 0950   ALKPHOS 78 07/01/2022 0950   BILITOT 0.3 07/01/2022 0950      Component Value Date/Time   TSH 0.80 12/14/2021 0000   TSH 1.300 06/18/2021 0936   Nutritional Lab Results  Component Value Date   VD25OH 46.9 07/01/2022   VD25OH 47.3 12/14/2021   VD25OH 51.5 12/14/2021    ASSOCIATED CONDITIONS ADDRESSED TODAY  ASSESSMENT AND PLAN  Problem List Items Addressed This Visit     Prediabetes - Primary   Vitamin D deficiency   Relevant Medications   Vitamin D, Ergocalciferol, (DRISDOL) 1.25 MG (50000 UNIT) CAPS capsule   Stress   Relevant Medications   buPROPion (WELLBUTRIN SR) 150 MG 12 hr tablet   Morbid obesity (HCC)-START BMI 47.03   Relevant Medications  tirzepatide (ZEPBOUND) 7.5 MG/0.5ML Pen   BMI 39.0-39.9,adult Current BMI 39.9  Obesity Significant progress with weight loss (36 lbs) and muscle mass gain. Patient is managing stress and avoiding comfort eating. She is also engaging in regular physical activity with her husband. Currently on Zepbound 7.5mg  and Wellbutrin 150mg . -Continue current regimen of Zepbound 7.5mg  and Wellbutrin 150mg . -Consider increasing Zepbound dose if cravings become unmanageable. -Continue regular physical activity and consider adding ankle weights for strength training. -Next appointment in 4 weeks (12/02/2022).  Prediabetes Last A1c was 5.5- at goal/ Insulin 11.4- nearing goal.   Medication(s): Zepbound 7.5 mg SQ weekly Denies mass in neck, dysphagia, dyspepsia, persistent hoarseness, abdominal pain, or N/V/Constipation or diarrhea. Has annual eye exam. Mood is stable.   Polyphagia:No She is working on a nutrition plan to decrease simple  carbohydrates, increase lean proteins and exercise to promote weight loss, improve glycemic control and prevent progression to Type 2 diabetes.   Lab Results  Component Value Date   HGBA1C 5.5 08/05/2022   HGBA1C 5.8 05/26/2022   HGBA1C 5.9 (H) 12/14/2021   HGBA1C 5.8 12/14/2021   HGBA1C 6.4 (H) 06/18/2021   Lab Results  Component Value Date   INSULIN 11.4 12/14/2021   INSULIN 16.5 06/18/2021   INSULIN 13.9 03/05/2021   INSULIN 13.2 06/12/2020    Plan: Continue and refill Zepbound 7.5 mg SQ weekly Continue working on nutrition plan to decrease simple carbohydrates, increase lean proteins and exercise to promote weight loss, improve glycemic control and prevent progression to Type 2 diabetes.   Vitamin D Deficiency Vitamin D is not at goal of 50.  Most recent vitamin D level was 46.9. She is on  prescription ergocalciferol 50,000 IU weekly. No N/V or muscle weakness with Ergocalciferol.  Lab Results  Component Value Date   VD25OH 46.9 07/01/2022   VD25OH 47.3 12/14/2021   VD25OH 51.5 12/14/2021    Plan: Continue and refill  prescription ergocalciferol 50,000 IU weekly Low vitamin D levels can be associated with adiposity and may result in leptin resistance and weight gain. Also associated with fatigue. Currently on vitamin D supplementation without any adverse effects.   Mood disorder/emotional eating/ Stress Dennisha has had issues with stress/emotional eating. Currently this is well controlled. Overall mood is stable. Medication(s): Bupropion SR 150 mg daily in am  Plan: Continue and refill Bupropion SR 150 mg daily in am Continue strategies for emotional eating/comfort eating like puzzles and reading.      General Health Maintenance -Continue healthy eating habits, including high fiber fruits and limiting unhealthy snacks. -Continue stress management strategies such as puzzles and reading. -Next appointment with primary care provider on 11/22/2022.  ATTESTASTION  STATEMENTS:  Reviewed by clinician on day of visit: allergies, medications, problem list, medical history, surgical history, family history, social history, and previous encounter notes.   I have personally spent 30 minutes total time today in preparation, patient care, nutritional counseling and documentation for this visit, including the following: review of clinical lab tests; review of medical tests/procedures/services.      Shadaya Marschner, PA-C

## 2022-11-10 ENCOUNTER — Other Ambulatory Visit (HOSPITAL_COMMUNITY): Payer: Self-pay

## 2022-11-11 ENCOUNTER — Other Ambulatory Visit (HOSPITAL_COMMUNITY): Payer: Self-pay

## 2022-11-16 ENCOUNTER — Telehealth (INDEPENDENT_AMBULATORY_CARE_PROVIDER_SITE_OTHER): Payer: Self-pay

## 2022-11-16 NOTE — Telephone Encounter (Signed)
Prior Berkley Harvey has been started for Zepbound.  Awaiting determination.

## 2022-11-17 NOTE — Telephone Encounter (Signed)
Prior auth for Zepbound has been denied.  Patient notified.

## 2022-11-22 LAB — MICROALBUMIN / CREATININE URINE RATIO
Creatinine, POC: 132 mg/dL
Microalb Creat Ratio: 7
Microalbumin, Urine: 0.92

## 2022-11-22 LAB — PROTEIN / CREATININE RATIO, URINE
Albumin, U: 0.92
Creatinine, Urine: 132

## 2022-11-22 LAB — CBC AND DIFFERENTIAL
HCT: 41 (ref 36–46)
Hemoglobin: 13.7 (ref 12.0–16.0)
WBC: 4

## 2022-11-22 LAB — BASIC METABOLIC PANEL
BUN: 16 (ref 4–21)
CO2: 29 — AB (ref 13–22)
Chloride: 103 (ref 99–108)
Creatinine: 1 (ref 0.5–1.1)
Glucose: 77
Potassium: 3.8 meq/L (ref 3.5–5.1)
Sodium: 140 (ref 137–147)

## 2022-11-22 LAB — LIPID PANEL
Cholesterol: 160 (ref 0–200)
HDL: 57 (ref 35–70)
LDL Cholesterol: 92
Triglycerides: 56 (ref 40–160)

## 2022-11-22 LAB — HEPATIC FUNCTION PANEL
ALT: 12 U/L (ref 7–35)
AST: 12 — AB (ref 13–35)
Alkaline Phosphatase: 56 (ref 25–125)
Bilirubin, Total: 0.6

## 2022-11-22 LAB — CBC: RBC: 4.77 (ref 3.87–5.11)

## 2022-11-22 LAB — COMPREHENSIVE METABOLIC PANEL
Albumin: 4.3 (ref 3.5–5.0)
Calcium: 9.1 (ref 8.7–10.7)
eGFR: 65

## 2022-11-22 LAB — HEMOGLOBIN A1C: Hemoglobin A1C: 5.7

## 2022-12-01 ENCOUNTER — Encounter (INDEPENDENT_AMBULATORY_CARE_PROVIDER_SITE_OTHER): Payer: Self-pay | Admitting: Physician Assistant

## 2022-12-02 ENCOUNTER — Ambulatory Visit (INDEPENDENT_AMBULATORY_CARE_PROVIDER_SITE_OTHER): Payer: Managed Care, Other (non HMO) | Admitting: Physician Assistant

## 2022-12-02 ENCOUNTER — Encounter (INDEPENDENT_AMBULATORY_CARE_PROVIDER_SITE_OTHER): Payer: Self-pay | Admitting: Physician Assistant

## 2022-12-02 ENCOUNTER — Other Ambulatory Visit (HOSPITAL_COMMUNITY): Payer: Self-pay

## 2022-12-02 ENCOUNTER — Other Ambulatory Visit (INDEPENDENT_AMBULATORY_CARE_PROVIDER_SITE_OTHER): Payer: Self-pay

## 2022-12-02 ENCOUNTER — Encounter (HOSPITAL_COMMUNITY): Payer: Self-pay

## 2022-12-02 VITALS — BP 125/82 | HR 89 | Temp 97.8°F | Ht <= 58 in | Wt 187.0 lb

## 2022-12-02 DIAGNOSIS — E559 Vitamin D deficiency, unspecified: Secondary | ICD-10-CM

## 2022-12-02 DIAGNOSIS — F439 Reaction to severe stress, unspecified: Secondary | ICD-10-CM

## 2022-12-02 DIAGNOSIS — R7303 Prediabetes: Secondary | ICD-10-CM

## 2022-12-02 DIAGNOSIS — I10 Essential (primary) hypertension: Secondary | ICD-10-CM

## 2022-12-02 DIAGNOSIS — Z6839 Body mass index (BMI) 39.0-39.9, adult: Secondary | ICD-10-CM

## 2022-12-02 MED ORDER — BUPROPION HCL ER (SR) 150 MG PO TB12
150.0000 mg | ORAL_TABLET | Freq: Every day | ORAL | 0 refills | Status: DC
Start: 2022-12-02 — End: 2023-01-04
  Filled 2022-12-02 (×2): qty 30, 30d supply, fill #0

## 2022-12-02 MED ORDER — VITAMIN D (ERGOCALCIFEROL) 1.25 MG (50000 UNIT) PO CAPS
50000.0000 [IU] | ORAL_CAPSULE | ORAL | 0 refills | Status: DC
  Filled 2022-12-02 (×2): qty 4, 28d supply, fill #0

## 2022-12-02 MED ORDER — TIRZEPATIDE-WEIGHT MANAGEMENT 10 MG/0.5ML ~~LOC~~ SOAJ
10.0000 mg | SUBCUTANEOUS | 2 refills | Status: DC
  Filled 2022-12-02: qty 2, 28d supply, fill #0
  Filled 2022-12-29: qty 2, 28d supply, fill #1

## 2022-12-02 NOTE — Telephone Encounter (Signed)
Prior Auth submitted for Zepbound 10 mg.  Awaiting determination.    [employer] is reviewing your PA request. Typically an electronic response will be received within 24-72 hours. To check for an update later, open this request from your dashboard.  You may close this dialog and return to your dashboard to perform other tasks.

## 2022-12-02 NOTE — Progress Notes (Signed)
SUBJECTIVE:  Chief Complaint: Obesity  Interim History: Mary Odonnell, a 57 year old patient with a history of prediabetes, vitamin D deficiency, and hypertension, presents for a follow-up visit regarding her obesity treatment plan.  She has been on Zepbound 7.5 mg weekly for medical weight loss, Bupropion 150 mg daily for stress/emotional eating, and ergocalciferol 50,000 units weekly for vitamin D supplementation.  The patient reports increased stress and emotional eating due to the holiday season. Despite this, she has been trying to maintain a healthy diet and regular exercise, including walking and weight lifting. She has noticed increased cravings and is considering an increase in her Zepbound dosage.  The patient also reports changes in her fluid intake and blood pressure management. Her primary care physician recently discontinued her diuretic due to low blood pressure readings likely related to improvement due to to significant weight loss with Zepbound.. Since then, the patient has been trying to increase her water intake and monitor her sodium consumption to manage her blood pressure.  The patient has been experiencing some itching at the site of her Zepbound injection about 48 hours post-injection. However, there are no other adverse reactions reported.  We discussed that we will monitor this closely.  She has had no overt rash localized to the injection site area and there is no erythema or are raised area (wheal formation) following injection.  Despite the holiday stress and changes in medication, the patient has lost a total of 38 pounds. She has been sorting through her old clothes, many of which are now too large. She expresses a commitment to maintaining her current weight and understanding her boundaries.  The patient's recent lab results from her PCP visit recently were reviewed, showing a hemoglobin A1c of 5.7, normal metabolic panel, liver function, kidney function, and  electrolytes. Her cholesterol panel was also satisfactory, with an HDL of 57, triglycerides of 156, and LDL of 92-nicely improved from previous.    Mary Odonnell is here to discuss her progress with her obesity treatment plan. She is on the Category 1 Plan and states she is following her eating plan approximately 50 % of the time. She states she is exercising walking 2 miles 15-45 minutes  1-3 times per week.   OBJECTIVE: Visit Diagnoses: Problem List Items Addressed This Visit     Essential hypertension   Prediabetes - Primary   Vitamin D deficiency   Relevant Medications   Vitamin D, Ergocalciferol, (DRISDOL) 1.25 MG (50000 UNIT) CAPS capsule   Stress   Relevant Medications   buPROPion (WELLBUTRIN SR) 150 MG 12 hr tablet   Morbid obesity (HCC)-START BMI 47.03   Relevant Medications   tirzepatide (ZEPBOUND) 10 MG/0.5ML Pen   BMI 39.0-39.9,adult Current BMI 39.9  Obesity Total weight loss of 38 pounds TBW Loss of 16.9% Started Zepbound 06/10/22 at 205 lbs.  Now at 187 lbs for weight loss of 18 lbs or 8.8% since starting Zepbound 06/10/22.   57 year old with obesity, on Zepbound 7.5 mg weekly and Wellbutrin 150 mg daily. Reports increased stress and emotional eating during the holiday season, leading to difficulty managing cravings. Despite this, has lost 38 pounds and engages in regular physical activity. Labs show normal metabolic panel, liver and kidney function, and cholesterol levels. Hemoglobin A1c is 5.7%. Discussed increasing Zepbound to 10 mg to manage cravings. Explained Zepbound's cardioprotective benefits. Discussed portion control and walking after meals to manage blood sugar. Informed about potential side effects like constipation and the need to monitor. Discussed resubmitting prior authorization for  Zepbound due to insurance denial based on BMI criteria. - Increase/refill Zepbound to 10 mg weekly - Continue Wellbutrin 150 mg daily - Continue regular physical activity - Implement  portion control strategies - Walk within 30 minutes of eating - Monitor for constipation and other side effects - Resubmit prior authorization for Zepbound  Prediabetes Prediabetes with hemoglobin A1c at 5.7%. No proteinuria. Discussed strategies to maintain blood sugar levels, including walking after meals. Continue working on nutrition plan to decrease simple carbohydrates, increase lean proteins and exercise to promote weight loss, improve glycemic control and prevent progression to Type 2 diabetes.  - Walk within 30 minutes of eating  Hypertension Hypertension, currently not on diuretics due to previously low blood pressure. Recent readings higher than previous. Advised to monitor blood pressure and watch for swelling. Discussed increasing water intake and reducing sodium intake. - Monitor blood pressure regularly Continue Norvasc 10 mg daily Low vitamin D levels can be associated with adiposity and may result in leptin resistance and weight gain. Also associated with fatigue. Currently on vitamin D supplementation without any adverse effects.  - Increase water intake - Reduce sodium intake  Vitamin D Deficiency Vitamin D deficiency, on ergocalciferol 50,000 units weekly. Last level was 46. No issues with nausea or muscle weakness. Discussed maintaining vitamin D levels, especially during winter, and increasing intake of vitamin D-rich foods like [employer] milk. - Continue/refill ergocalciferol 50,000 units weekly - Recheck vitamin D level early next year - Increase intake of vitamin D-rich foods   Stress:  Reports some increased stress with end of year projects. Does have some time off/vacation planned. On Wellbutrin 150 mg daily without side effects and feels is helpful for stress management/emotional eating.  Continue and refill Wellbutrin 150 mg daily.    General Health Maintenance Engages in regular physical activity and dietary adjustments to manage weight and overall health.  Discussed strategies for maintaining health during the holiday season, including portion control and walking after meals. - Continue regular physical activity - Implement portion control strategies - Monitor for new symptoms or health issues  Follow-up - Follow-up appointment on December 24th at noon - Resubmit prior authorization for [employer] provider if issues with increased Zepbound dose or other health concerns arise.  Vitals Temp: 97.8 F (36.6 C) BP: 125/82 Pulse Rate: 89 SpO2: 99 %   Anthropometric Measurements Height: 4\' 10"  (1.473 m) Weight: 187 lb (84.8 kg) BMI (Calculated): 39.09 Weight at Last Visit: 190 lb Weight Lost Since Last Visit: 2 lb Weight Gained Since Last Visit: 0 Starting Weight: 225 lb Total Weight Loss (lbs): 38 lb (17.2 kg) Peak Weight: 232 lb   Body Composition  Body Fat %: 43.8 % Fat Mass (lbs): 82 lbs Muscle Mass (lbs): 99.6 lbs Total Body Water (lbs): 69.2 lbs Visceral Fat Rating : 13   Other Clinical Data Fasting: yes Labs: no Today's Visit #: 42 Starting Date: 06/12/20     ASSESSMENT AND PLAN:  Diet: Nyoka is currently in the action stage of change. As such, her goal is to continue with weight loss efforts. She has agreed to Category 1 Plan.  Exercise: Jess has been instructed to work up to a goal of 150 minutes of combined cardio and strengthening exercise per week for weight loss and overall health benefits.   Behavior Modification:  We discussed the following Behavioral Modification Strategies today: increasing lean protein intake, decreasing simple carbohydrates, increasing vegetables, increase H2O intake, increase high fiber foods, no skipping meals, emotional eating  strategies , and holiday eating strategies. We discussed various medication options to help Aneesa with her weight loss efforts and we both agreed to increase Zepbound to 10 mg weekly.  Return in about 4 weeks (around 12/30/2022).Marland Kitchen She was  informed of the importance of frequent follow up visits to maximize her success with intensive lifestyle modifications for her multiple health conditions.  Attestation Statements:   Reviewed by clinician on day of visit: allergies, medications, problem list, medical history, surgical history, family history, social history, and previous encounter notes.   Time spent on visit including pre-visit chart review and post-visit care and charting was 42 minutes.    Toiya Morrish, PA-C

## 2022-12-03 ENCOUNTER — Other Ambulatory Visit (INDEPENDENT_AMBULATORY_CARE_PROVIDER_SITE_OTHER): Payer: Self-pay | Admitting: Physician Assistant

## 2022-12-03 DIAGNOSIS — E559 Vitamin D deficiency, unspecified: Secondary | ICD-10-CM

## 2022-12-06 NOTE — Telephone Encounter (Deleted)
PA for Zepbound has been approved. Patient is already aware.   Outcome Approved on November 21 by [employer] 2017 NCPDP Request Reference Number: [id number]. ZEPBOUND INJ 10/0.5ML is approved through 06/01/2023. Your patient may now fill this prescription and it will be covered. Authorization Expiration Date: 06/01/2023

## 2022-12-06 NOTE — Telephone Encounter (Signed)
PA for Zepbound has been approved. Patient is already aware.     Outcome Approved on November 21 by [employer] 2017 NCPDP Request Reference Number: [id number]. ZEPBOUND INJ 10/0.5ML is approved through 06/01/2023. Your patient may now fill this prescription and it will be covered. Authorization Expiration Date: 06/01/2023

## 2022-12-29 ENCOUNTER — Other Ambulatory Visit: Payer: Self-pay

## 2022-12-29 ENCOUNTER — Encounter (INDEPENDENT_AMBULATORY_CARE_PROVIDER_SITE_OTHER): Payer: Self-pay | Admitting: Physician Assistant

## 2022-12-29 ENCOUNTER — Other Ambulatory Visit (INDEPENDENT_AMBULATORY_CARE_PROVIDER_SITE_OTHER): Payer: Self-pay | Admitting: Physician Assistant

## 2022-12-29 ENCOUNTER — Other Ambulatory Visit (HOSPITAL_COMMUNITY): Payer: Self-pay

## 2022-12-29 MED ORDER — TIRZEPATIDE-WEIGHT MANAGEMENT 10 MG/0.5ML ~~LOC~~ SOAJ
10.0000 mg | SUBCUTANEOUS | 2 refills | Status: DC
  Filled 2022-12-30: qty 2, 28d supply, fill #0

## 2022-12-30 ENCOUNTER — Other Ambulatory Visit (HOSPITAL_COMMUNITY): Payer: Self-pay

## 2023-01-03 NOTE — Progress Notes (Unsigned)
SUBJECTIVE:  Chief Complaint: Obesity  Interim History: She is down 5 lbs since her last visit.  Down 43 lbs overall TBW loss of 19.1% Mary Odonnell, a patient with obesity, hypertension, and vitamin D deficiency, has been following a treatment plan that includes Zepbound 10mg  weekly, Cozaar 100mg  daily, Ergocalciferol 50000 units weekly, and Bupropion 150mg  daily. The patient reports a positive response to the treatment, with the exception of localized itching and redness at the injection site of Zepbound. This reaction, described as a small wheal that appears a day or two after the injection and resolves before the next dose is due. The patient has noticed that the reaction seems to be less severe when the injection is administered in the leg compared to the stomach.  The patient has been mindful of rotating injection sites and has not noticed any systemic symptoms related to the injection. She has also ruled out changes in personal care products as a potential cause for the reaction. Despite this minor side effect, the patient has experienced significant weight loss, with a reduction of 43 pounds and a decrease in adipose tissue. We discussed monitoring closely and switching to leg injections only alternating sides for the next month.   The patient has also been managing stress and emotional eating with Bupropion 150mg  daily, with no reported side effects. She has been proactive in maintaining an active lifestyle, including regular use of a treadmill and light weightlifting at home. The patient is considering joining a gym for more structured workouts.  In summary, the patient has been responding well to the current treatment plan for obesity, hypertension, and vitamin D deficiency, with significant weight loss and manageable side effects. The patient is committed to maintaining an active lifestyle and is considering further steps to enhance her fitness regimen.  Mary Odonnell is here to discuss her  progress with her obesity treatment plan. She is on the Category 1 Plan and states she is following her eating plan approximately 40 % of the time. She states she is exercising walking /weights 20 minutes 4 times per week.   OBJECTIVE: Visit Diagnoses: Problem List Items Addressed This Visit     Essential hypertension   Prediabetes - Primary   Vitamin D deficiency   Relevant Medications   Vitamin D, Ergocalciferol, (DRISDOL) 1.25 MG (50000 UNIT) CAPS capsule   Stress   Relevant Medications   buPROPion (WELLBUTRIN SR) 150 MG 12 hr tablet   Morbid obesity (HCC)-START BMI 47.03   Relevant Medications   tirzepatide (ZEPBOUND) 10 MG/0.5ML Pen   Polyphagia  Obesity with polyphagia Patient is doing well on Zepbound 10 mg weekly with significant weight loss (down 43 pounds) . Localized itching and redness at injection sites noted, particularly on the stomach, but not on the legs. Patient is rotating injection sites and using alcohol swabs. No new lotions or significant changes in soaps. Discussed the importance of continuing the current regimen due to positive outcomes and the potential for weight regain if treatment is stopped. Advised to monitor for systemic symptoms or significant localized reactions and to take 25 mg Benadryl if localized reaction occurs. - Continue /refill Zepbound 10 mg weekly - Administer injections on legs for the next month - Monitor for systemic symptoms or significant localized reactions and seek care immediately for any concerns for systemic reaction. - Take 25 mg Benadryl if localized reaction occurs - Follow up in 4 weeks  Stress/Emotional Eating Managed with bupropion 150 mg daily. No issues or side effects reported. -  Continue/refill  bupropion 150 mg daily  Hypertension Managed with Cozaar 100 mg daily. No issues or side effects reported. - Continue Cozaar 100 mg daily  Vitamin D Deficiency Managed with ergocalciferol 50,000 units weekly. No issues or  side effects reported. - Continue/refill ergocalciferol 50,000 units weekly Low vitamin D levels can be associated with adiposity and may result in leptin resistance and weight gain. Also associated with fatigue. Currently on vitamin D supplementation without any adverse effects.  Recheck vitamin D level  General Health Maintenance Patient is mindful of diet and exercise, especially during holidays. Plans to maintain physical activity during upcoming beach trip. Discussed importance of protein intake and mindful eating during holidays. Encouraged to continue treadmill use and consider gym membership for strength training. - Maintain current diet and exercise regimen - Engage in physical activities during beach trip - Consider gym membership for strength training - Focus on protein intake and mindful eating during holidays  Follow-up - Follow up appointment on February 01, 2023 at 12:30 PM.  Vitals Temp: 98.7 F (37.1 C) BP: 125/86 Pulse Rate: 91 SpO2: 97 %   Anthropometric Measurements Height: 4\' 10"  (1.473 m) Weight: 182 lb (82.6 kg) BMI (Calculated): 38.05 Weight at Last Visit: 187lb Weight Lost Since Last Visit: 5lb Weight Gained Since Last Visit: 0lb Starting Weight: 225 lb Total Weight Loss (lbs): 43 lb (19.5 kg) Peak Weight: 232 lb   Body Composition  Body Fat %: 42.4 % Fat Mass (lbs): 77.2 lbs Muscle Mass (lbs): 99.6 lbs Total Body Water (lbs): 69.8 lbs Visceral Fat Rating : 13   Other Clinical Data Fasting: no Labs: no Today's Visit #: 43 Starting Date: 06/12/20     ASSESSMENT AND PLAN:  Diet: Aldona is currently in the action stage of change. As such, her goal is to continue with weight loss efforts. She has agreed to Category 1 Plan.  Exercise: Kiyla has been instructed to work up to a goal of 150 minutes of combined cardio and strengthening exercise per week for weight loss and overall health benefits.   Behavior Modification:  We discussed the  following Behavioral Modification Strategies today: increasing lean protein intake, decreasing simple carbohydrates, increasing vegetables, increase H2O intake, increase high fiber foods, no skipping meals, meal planning and cooking strategies, emotional eating strategies , holiday eating strategies, and planning for success. We discussed various medication options to help Angellea with her weight loss efforts and we both agreed to continue Zepbound 10 mg weekly for medical weight loss.  Return in about 4 weeks (around 02/01/2023).Marland Kitchen She was informed of the importance of frequent follow up visits to maximize her success with intensive lifestyle modifications for her multiple health conditions.  Attestation Statements:   Reviewed by clinician on day of visit: allergies, medications, problem list, medical history, surgical history, family history, social history, and previous encounter notes.   Time spent on visit including pre-visit chart review and post-visit care and charting was 33 minutes.    Kendal Ghazarian, PA-C

## 2023-01-04 ENCOUNTER — Ambulatory Visit (INDEPENDENT_AMBULATORY_CARE_PROVIDER_SITE_OTHER): Payer: Managed Care, Other (non HMO) | Admitting: Physician Assistant

## 2023-01-04 ENCOUNTER — Encounter (INDEPENDENT_AMBULATORY_CARE_PROVIDER_SITE_OTHER): Payer: Self-pay | Admitting: Physician Assistant

## 2023-01-04 ENCOUNTER — Other Ambulatory Visit (HOSPITAL_COMMUNITY): Payer: Self-pay

## 2023-01-04 ENCOUNTER — Encounter (HOSPITAL_COMMUNITY): Payer: Self-pay

## 2023-01-04 VITALS — BP 125/86 | HR 91 | Temp 98.7°F | Ht <= 58 in | Wt 182.0 lb

## 2023-01-04 DIAGNOSIS — Z6838 Body mass index (BMI) 38.0-38.9, adult: Secondary | ICD-10-CM

## 2023-01-04 DIAGNOSIS — F439 Reaction to severe stress, unspecified: Secondary | ICD-10-CM

## 2023-01-04 DIAGNOSIS — I10 Essential (primary) hypertension: Secondary | ICD-10-CM | POA: Diagnosis not present

## 2023-01-04 DIAGNOSIS — E559 Vitamin D deficiency, unspecified: Secondary | ICD-10-CM

## 2023-01-04 DIAGNOSIS — R632 Polyphagia: Secondary | ICD-10-CM | POA: Diagnosis not present

## 2023-01-04 DIAGNOSIS — R7303 Prediabetes: Secondary | ICD-10-CM

## 2023-01-04 MED ORDER — BUPROPION HCL ER (SR) 150 MG PO TB12
150.0000 mg | ORAL_TABLET | Freq: Every day | ORAL | 0 refills | Status: DC
  Filled 2023-01-04 (×2): qty 30, 30d supply, fill #0

## 2023-01-04 MED ORDER — VITAMIN D (ERGOCALCIFEROL) 1.25 MG (50000 UNIT) PO CAPS
50000.0000 [IU] | ORAL_CAPSULE | ORAL | 0 refills | Status: DC
  Filled 2023-01-04 (×2): qty 4, 28d supply, fill #0

## 2023-01-04 MED ORDER — TIRZEPATIDE-WEIGHT MANAGEMENT 10 MG/0.5ML ~~LOC~~ SOAJ
10.0000 mg | SUBCUTANEOUS | 2 refills | Status: DC
  Filled 2023-01-04 – 2023-01-27 (×3): qty 2, 28d supply, fill #0

## 2023-01-06 ENCOUNTER — Other Ambulatory Visit (HOSPITAL_COMMUNITY): Payer: Self-pay

## 2023-01-27 ENCOUNTER — Other Ambulatory Visit (HOSPITAL_COMMUNITY): Payer: Self-pay

## 2023-01-27 ENCOUNTER — Encounter (HOSPITAL_COMMUNITY): Payer: Self-pay

## 2023-02-01 ENCOUNTER — Ambulatory Visit (INDEPENDENT_AMBULATORY_CARE_PROVIDER_SITE_OTHER): Payer: Managed Care, Other (non HMO) | Admitting: Physician Assistant

## 2023-02-01 ENCOUNTER — Other Ambulatory Visit (HOSPITAL_COMMUNITY): Payer: Self-pay

## 2023-02-01 ENCOUNTER — Encounter (INDEPENDENT_AMBULATORY_CARE_PROVIDER_SITE_OTHER): Payer: Self-pay | Admitting: Physician Assistant

## 2023-02-01 VITALS — BP 130/83 | HR 82 | Temp 99.0°F | Ht <= 58 in | Wt 181.0 lb

## 2023-02-01 DIAGNOSIS — R7303 Prediabetes: Secondary | ICD-10-CM | POA: Diagnosis not present

## 2023-02-01 DIAGNOSIS — I10 Essential (primary) hypertension: Secondary | ICD-10-CM | POA: Diagnosis not present

## 2023-02-01 DIAGNOSIS — R632 Polyphagia: Secondary | ICD-10-CM

## 2023-02-01 DIAGNOSIS — E559 Vitamin D deficiency, unspecified: Secondary | ICD-10-CM | POA: Diagnosis not present

## 2023-02-01 DIAGNOSIS — Z6837 Body mass index (BMI) 37.0-37.9, adult: Secondary | ICD-10-CM

## 2023-02-01 DIAGNOSIS — F439 Reaction to severe stress, unspecified: Secondary | ICD-10-CM

## 2023-02-01 MED ORDER — VITAMIN D (ERGOCALCIFEROL) 1.25 MG (50000 UNIT) PO CAPS
50000.0000 [IU] | ORAL_CAPSULE | ORAL | 0 refills | Status: DC
  Filled 2023-02-01: qty 4, 28d supply, fill #0

## 2023-02-01 MED ORDER — BUPROPION HCL ER (SR) 150 MG PO TB12
150.0000 mg | ORAL_TABLET | Freq: Every day | ORAL | 0 refills | Status: DC
  Filled 2023-02-01: qty 30, 30d supply, fill #0

## 2023-02-01 NOTE — Progress Notes (Signed)
SUBJECTIVE:  Chief Complaint: Obesity  Interim History: She is down 1 lb since last visit.  Down 44 lbs overall TBW loss of 19.6% !! Pam Jacqulyn Bath, a 58 year old female with obesity and vitamin D deficiency, presents for a follow-up visit regarding her obesity treatment plan. She is currently on Zepbound 10 mg weekly, Wellbutrin 150 mg daily, and ergocalciferol 50,000 units weekly. The patient reports a significant weight loss of 44 pounds since the initiation of the treatment plan.  However, the patient has encountered issues with her insurance company, which now requires participation in [employer] for coverage of her medications. We are going to continue Zepbound if able at this point and she will explore following up with [employer].   In addition to her medication regimen, the patient has been actively participating in physical activities, including playing games on Xbox and using weights for strength training. She has also been considering signing up for a meal delivery service, such as Hello [employer] or [employer], to help with meal planning and preparation.  The patient also reports being on metformin 750 mg for insulin resistance, which she expresses a desire to discontinue in the future. However, she acknowledges the potential benefits of metformin in managing hunger and cravings, especially in the context of her weight loss journey.  The patient is scheduled for a follow-up appointment in February and plans to continue her current treatment plan, including the use of Zepbound Wellbutrin, and ergocalciferol, as well as her physical activities and potential meal delivery service. She expresses a commitment to managing her obesity and maintaining her weight loss progress. Ahmaya is here to discuss her progress with her obesity treatment plan. She is on the Category 1 Plan and states she is following her eating plan approximately 60 % of the time. She states she is exercising  walking/weights/X box 30 minutes 3 times per week.   OBJECTIVE: Visit Diagnoses: Problem List Items Addressed This Visit     Essential hypertension   Prediabetes - Primary   Vitamin D deficiency   Relevant Medications   Vitamin D, Ergocalciferol, (DRISDOL) 1.25 MG (50000 UNIT) CAPS capsule   Stress   Relevant Medications   buPROPion (WELLBUTRIN SR) 150 MG 12 hr tablet   Morbid obesity (HCC)-START BMI 47.03   Polyphagia   Other Visit Diagnoses       BMI 37.0-37.9, adult Current BMI 37.9         Obesity 58 year old individual on Zepbound 10 mg weekly, Wellbutrin 150 mg daily, and ergocalciferol 50,000 units weekly for vitamin D deficiency. Total weight loss of 44 pounds noted. Compliant with [employer] program. Discussed risks of compounded tirzepatide products, including potential contaminants and adverse effects. Prefers to continue with brand name Zepbound. Discussed benefits of metformin in combination with Zepbound for continued weight loss and insulin resistance. Expresses desire to discontinue metformin in the future. Encouraged regular exercise and meal delivery services for convenience and healthy eating. - Continue Zepbound 10 mg weekly - Continue Wellbutrin 150 mg daily - Continue ergocalciferol 50,000 units weekly - Provide a copy of the most recent Zepbound 10 mg prescription to provide to [employer] program.  - Advise caution with compounded medications and discuss with [employer] provider - Encourage continued adherence to [employer] program as required by her company - Encourage regular exercise and use of weights - Discuss potential use of meal delivery services like Hello [employer], [employer], and [employer]  Prediabetes On metformin 750 mg for prediabetes.  Discussed benefits of  metformin in combination with Zepbound for weight loss and insulin resistance. Expresses desire to discontinue metformin in the future. Discussed management of rebound hunger and cravings  upon discontinuation. - Continue metformin 750 mg - Discuss potential future discontinuation of metformin and management of rebound hunger and cravings Continue working on nutrition plan to decrease simple carbohydrates, increase lean proteins and exercise to promote weight loss, improve glycemic control and prevent progression to Type 2 diabetes.   Vitamin D Deficiency On ergocalciferol 50,000 units weekly for vitamin D deficiency. No issues reported with current regimen. Low vitamin D levels can be associated with adiposity and may result in leptin resistance and weight gain. Also associated with fatigue. Currently on vitamin D supplementation without any adverse effects.  - Continue/refill ergocalciferol 50,000 units weekly  Eating disorder/emotional eating Marbeth has had issues with stress/emotional eating. Currently this is well controlled. Overall mood is stable. Medication(s): Bupropion SR 200 mg daily in am  Plan: Continue and refill Bupropion SR 200 mg daily in am Continue to work on emotional eating behavior strategies.   General Health Maintenance Engaging in regular physical activity, including using an Xbox for exercise and treadmill workouts. Discussion about protein intake and use of protein drinks. Encouraged to maintain a balanced diet and consider meal delivery services for convenience and healthy eating. - Encourage regular physical activity - Encourage use of protein drinks like [employer] - Consider meal delivery services like Hello [employer], [employer], and [employer]  Follow-up - Follow-up appointment on February 13th at 9 AM   Vitals Temp: 99 F (37.2 C) BP: 130/83 Pulse Rate: 82 SpO2: 98 %   Anthropometric Measurements Height: 4\' 10"  (1.473 m) Weight: 181 lb (82.1 kg) BMI (Calculated): 37.84 Weight at Last Visit: 182 lb Weight Lost Since Last Visit: 1 lb Weight Gained Since Last Visit: 0 Starting Weight: 225 lb Total Weight Loss (lbs): 44 lb (20 kg) Peak  Weight: 232 lb   Body Composition  Body Fat %: 43.4 % Fat Mass (lbs): 78.8 lbs Muscle Mass (lbs): 97.4 lbs Total Body Water (lbs): 69.4 lbs Visceral Fat Rating : 13   Other Clinical Data Fasting: no Labs: no Today's Visit #: 44 Starting Date: 06/12/20     ASSESSMENT AND PLAN:  Diet: Zauria is currently in the action stage of change. As such, her goal is to continue with weight loss efforts. She has agreed to Category 1 Plan.  Exercise: Jaye has been instructed to work up to a goal of 150 minutes of combined cardio and strengthening exercise per week for weight loss and overall health benefits.   Behavior Modification:  We discussed the following Behavioral Modification Strategies today: increasing lean protein intake, decreasing simple carbohydrates, increasing vegetables, increase H2O intake, increase high fiber foods, no skipping meals, meal planning and cooking strategies, better snacking choices, emotional eating strategies , and planning for success. We discussed various medication options to help Shantez with her weight loss efforts and we both agreed to continue Zepbound 10 mg weekly for medical weight loss and Wellbutrin for emotional eating behavior.  Return in about 3 weeks (around 02/22/2023).Marland Kitchen She was informed of the importance of frequent follow up visits to maximize her success with intensive lifestyle modifications for her multiple health conditions.  Attestation Statements:   Reviewed by clinician on day of visit: allergies, medications, problem list, medical history, surgical history, family history, social history, and previous encounter notes.   Time spent on visit including pre-visit chart review and post-visit care and charting  was 38 minutes.    Nickoli Bagheri, PA-C

## 2023-02-08 ENCOUNTER — Telehealth (INDEPENDENT_AMBULATORY_CARE_PROVIDER_SITE_OTHER): Payer: Self-pay | Admitting: *Deleted

## 2023-02-08 NOTE — Telephone Encounter (Signed)
Called patient and left message that her zepbound- is not covered by her insurance.

## 2023-02-10 ENCOUNTER — Other Ambulatory Visit (HOSPITAL_COMMUNITY): Payer: Self-pay

## 2023-02-10 MED ORDER — ZEPBOUND 10 MG/0.5ML ~~LOC~~ SOAJ
10.0000 mg | SUBCUTANEOUS | 0 refills | Status: DC
  Filled 2023-02-10: qty 2, 28d supply, fill #0

## 2023-02-17 ENCOUNTER — Other Ambulatory Visit (HOSPITAL_COMMUNITY): Payer: Self-pay

## 2023-02-24 ENCOUNTER — Ambulatory Visit (INDEPENDENT_AMBULATORY_CARE_PROVIDER_SITE_OTHER): Payer: Managed Care, Other (non HMO) | Admitting: Physician Assistant

## 2023-02-24 ENCOUNTER — Encounter (INDEPENDENT_AMBULATORY_CARE_PROVIDER_SITE_OTHER): Payer: Self-pay | Admitting: Physician Assistant

## 2023-02-24 VITALS — BP 119/81 | HR 77 | Temp 98.3°F | Ht <= 58 in | Wt 178.0 lb

## 2023-02-24 DIAGNOSIS — F439 Reaction to severe stress, unspecified: Secondary | ICD-10-CM | POA: Diagnosis not present

## 2023-02-24 DIAGNOSIS — E559 Vitamin D deficiency, unspecified: Secondary | ICD-10-CM | POA: Diagnosis not present

## 2023-02-24 DIAGNOSIS — R632 Polyphagia: Secondary | ICD-10-CM | POA: Diagnosis not present

## 2023-02-24 DIAGNOSIS — R7303 Prediabetes: Secondary | ICD-10-CM | POA: Diagnosis not present

## 2023-02-24 DIAGNOSIS — R5383 Other fatigue: Secondary | ICD-10-CM

## 2023-02-24 DIAGNOSIS — Z6837 Body mass index (BMI) 37.0-37.9, adult: Secondary | ICD-10-CM

## 2023-02-24 MED ORDER — BUPROPION HCL ER (SR) 150 MG PO TB12: 150.0000 mg | ORAL_TABLET | Freq: Every day | ORAL | 0 refills | Status: DC

## 2023-02-24 MED ORDER — VITAMIN D (ERGOCALCIFEROL) 1.25 MG (50000 UNIT) PO CAPS: 50000.0000 [IU] | ORAL_CAPSULE | ORAL | 0 refills | Status: DC

## 2023-02-24 NOTE — Progress Notes (Signed)
SUBJECTIVE: Discussed the use of AI scribe software for clinical note transcription with the patient, who gave verbal consent to proceed.  Chief Complaint: Obesity  Interim History: She is down 2 lbs since last visit.  Down 47 lbs  TBW loss of 20.9% !! Mary Odonnell is a 58 year old female with obesity who presents for follow-up of her obesity treatment plan.  She has successfully lost 47 pounds, approximately 21% of her body weight, as part of her obesity treatment plan. She is currently receiving her medication through [facility] and plans to continue with the [facility] program due to its benefits.  She meets her calorie and protein needs most of the time and has incorporated protein shakes into her diet. Occasional fatigue is noted, attributed to a busy schedule rather than a medical issue.  She experiences localized irritation and itching at the injection site of her medication, managed with allergy pills and Benadryl as needed. Despite trying different injection sites, including her thighs and stomach, irritation persists.  She reports a change in her vision, particularly at night, with difficulty seeing clearly with her glasses.  She has a history of prediabetes and hypertension. Her lipid profile is good, with HDL, total cholesterol, and triglycerides within normal limits. She is not on cholesterol medication but is at risk due to her prediabetes. No major issues with nausea, vomiting, diarrhea, or constipation are reported.  [name] is here to discuss her progress with her obesity treatment plan. She is on the Category 1 Plan and states she is following her eating plan approximately 55 % of the time. She states she is exercising walking 1 mile/weights 30/10 minutes 3/2 times per week.  Fasting labs obtained today.  The patient was informed we would discuss the lab results at the next visit unless there is a critical issue that needs to be addressed sooner. The patient agreed to keep  the next visit at the agreed upon time to discuss these results.    OBJECTIVE: Visit Diagnoses: Problem List Items Addressed This Visit     Prediabetes - Primary   Relevant Orders   CMP14+EGFR   Hemoglobin A1c   Insulin, random   Lipid Panel With LDL/HDL Ratio   Vitamin D deficiency   Relevant Medications   Vitamin D, Ergocalciferol, (DRISDOL) 1.25 MG (50000 UNIT) CAPS capsule   Other Relevant Orders   VITAMIN D 25 Hydroxy (Vit-D Deficiency, Fractures)   Stress   Relevant Medications   buPROPion (WELLBUTRIN SR) 150 MG 12 hr tablet   Morbid obesity (HCC)-START BMI 47.03   Polyphagia   Other Visit Diagnoses       Fatigue, unspecified type       Relevant Orders   Vitamin B12   CBC with Differential/Platelet     BMI 37.0-37.9, adult Current BMI 37.3         Obesity with polyphagia.  Mary Odonnell, a 58 year old female, has successfully lost 47 pounds (21% of her body weight) through a [facility].  Polyphagia well controlled with Zepbound  [facility]chers is now prescribing her medication, Zepbound 10 mg weekly.   She has built muscle and reduced adipose tissue, with a visceral adipose rating of 12.  She reports localized irritation and itching at the injection site, managed with allergy medication. No systemic reactions noted. Discussed using chlorhexidine swabs if irritation persists vs use of alcohol. Make sure site completely dry before administering the injection.  - Continue current obesity management  Zepbound  10 mg weekly as prescribed through [facility]. - Schedule monthly follow-up visits to monitor weight and body composition. - Continue taking allergy medication (e.g., Benadryl) around the time of injections. - Consider using chlorhexidine swabs instead of alcohol swabs if irritation persists.  Prediabetes Mary's last lipid profile is good, but continuous monitoring is necessary due to her risk factors. Lab Results  Component Value Date    HGBA1C 5.7 11/22/2022   HGBA1C 5.5 08/05/2022   HGBA1C 5.8 05/26/2022   Lab Results  Component Value Date   LDLCALC 92 11/22/2022   CREATININE 1.0 11/22/2022   Order fasting labs- A1c, insulin, CMET and fasting lipid panel. -Continue working on nutrition plan to decrease simple carbohydrates, increase lean proteins and exercise to promote weight loss, improve glycemic control and prevent progression to Type 2 diabetes.    Vitamin D Deficiency Vitamin D is not at goal of 50.  Most recent vitamin D level was 46.5. She is on  prescription ergocalciferol 50,000 IU weekly. No N/V or muscle weakness with Ergo.  Lab Results  Component Value Date   VD25OH 46.9 07/01/2022   VD25OH 47.3 12/14/2021   VD25OH 51.5 12/14/2021    Plan: Continue and refill  prescription ergocalciferol 50,000 IU weekly Low vitamin D levels can be associated with adiposity and may result in leptin resistance and weight gain. Also associated with fatigue.  Currently on vitamin D supplementation without any adverse effects such as nausea, vomiting or muscle weakness.  Recheck vitamin D level today.   Fatigue Mary reports occasional fatigue, potentially related to her overall health and medication regimen. Comprehensive lab work is needed to evaluate underlying causes. - Order  lab work- B 12, CBC, vitamin D to evaluate underlying causes of fatigue.  Stress [name] has had issues with stress/emotional eating. Currently this is well controlled. Overall mood is stable. Medication(s): Bupropion SR 150 mg daily in am  Plan: Continue and refill Bupropion SR 150 mg daily in am Continue to work on emotional eating strategies.   General Health Maintenance None - Order comprehensive lab work including lipid panel. - Refill Wellbutrin prescription.  Follow-up - Schedule next follow-up appointment for March 24, 2023. - Discuss lab results and any necessary adjustments to the treatment plan during the next  visit.  Vitals Temp: 98.3 F (36.8 C) BP: 119/81 Pulse Rate: 77 SpO2: 98 %   Anthropometric Measurements Height: 4\' 10"  (1.473 m) Weight: 178 lb (80.7 kg) BMI (Calculated): 37.21 Weight at Last Visit: 181 lb Weight Lost Since Last Visit: 3 Weight Gained Since Last Visit: 0 Starting Weight: 225 lb Total Weight Loss (lbs): 47 lb (21.3 kg) Peak Weight: 232 lb   Body Composition  Body Fat %: 42.1 % Fat Mass (lbs): 75.2 lbs Muscle Mass (lbs): 98.2 lbs Total Body Water (lbs): 68.6 lbs Visceral Fat Rating : 12   Other Clinical Data Fasting: yes Labs: yes Today's Visit #: 45 Starting Date: 06/12/20     ASSESSMENT AND PLAN:  Diet: Jandi is currently in the action stage of change. As such, her goal is to continue with weight loss efforts. She has agreed to Category 1 Plan.  Exercise: Marykatherine has been instructed to work up to a goal of 150 minutes of combined cardio and strengthening exercise per week for weight loss and overall health benefits.   Behavior Modification:  We discussed the following Behavioral Modification Strategies today: increasing lean protein intake, decreasing simple carbohydrates, increasing vegetables, increase H2O intake, increase high fiber foods, no  skipping meals, meal planning and cooking strategies, emotional eating strategies , avoiding temptations, and planning for success. We discussed various medication options to help Samuel with her weight loss efforts and we both agreed to continue tirzepatide 10 mg weekly as prescribed by weight watchers and wellbutrin for emotional eating behavior and continue to work on nutritional and behavioral strategies to promote weight loss.  .  Return in about 4 weeks (around 03/24/2023).Marland Kitchen She was informed of the importance of frequent follow up visits to maximize her success with intensive lifestyle modifications for her multiple health conditions.  Attestation Statements:   Reviewed by clinician on day of  visit: allergies, medications, problem list, medical history, surgical history, family history, social history, and previous encounter notes.   Time spent on visit including pre-visit chart review and post-visit care and charting was 36 minutes.    Case Vassell, PA-C

## 2023-02-25 LAB — CMP14+EGFR
ALT: 12 [IU]/L (ref 0–32)
AST: 16 [IU]/L (ref 0–40)
Albumin: 4.3 g/dL (ref 3.8–4.9)
Alkaline Phosphatase: 70 [IU]/L (ref 44–121)
BUN/Creatinine Ratio: 11 (ref 9–23)
BUN: 11 mg/dL (ref 6–24)
Bilirubin Total: 0.5 mg/dL (ref 0.0–1.2)
CO2: 24 mmol/L (ref 20–29)
Calcium: 9.1 mg/dL (ref 8.7–10.2)
Chloride: 105 mmol/L (ref 96–106)
Creatinine, Ser: 1 mg/dL (ref 0.57–1.00)
Globulin, Total: 2.6 g/dL (ref 1.5–4.5)
Glucose: 78 mg/dL (ref 70–99)
Potassium: 4.4 mmol/L (ref 3.5–5.2)
Sodium: 143 mmol/L (ref 134–144)
Total Protein: 6.9 g/dL (ref 6.0–8.5)
eGFR: 66 mL/min/{1.73_m2} (ref >59)

## 2023-02-25 LAB — CBC WITH DIFFERENTIAL/PLATELET
Basophils Absolute: 0 10*3/uL (ref 0.0–0.2)
Basos: 1 %
EOS (ABSOLUTE): 0 10*3/uL (ref 0.0–0.4)
Eos: 1 %
Hematocrit: 43.6 % (ref 34.0–46.6)
Hemoglobin: 14.2 g/dL (ref 11.1–15.9)
Immature Grans (Abs): 0 10*3/uL (ref 0.0–0.1)
Immature Granulocytes: 0 %
Lymphocytes Absolute: 1.7 10*3/uL (ref 0.7–3.1)
Lymphs: 53 %
MCH: 28.1 pg (ref 26.6–33.0)
MCHC: 32.6 g/dL (ref 31.5–35.7)
MCV: 86 fL (ref 79–97)
Monocytes Absolute: 0.3 10*3/uL (ref 0.1–0.9)
Monocytes: 8 %
Neutrophils Absolute: 1.2 10*3/uL — ABNORMAL LOW (ref 1.4–7.0)
Neutrophils: 37 %
Platelets: 313 10*3/uL (ref 150–450)
RBC: 5.06 x10E6/uL (ref 3.77–5.28)
RDW: 13.2 % (ref 11.7–15.4)
WBC: 3.2 10*3/uL — ABNORMAL LOW (ref 3.4–10.8)

## 2023-02-25 LAB — HEMOGLOBIN A1C
Est. average glucose Bld gHb Est-mCnc: 111 mg/dL
Hgb A1c MFr Bld: 5.5 % (ref 4.8–5.6)

## 2023-02-25 LAB — LIPID PANEL WITH LDL/HDL RATIO
Cholesterol, Total: 153 mg/dL (ref 100–199)
HDL: 63 mg/dL (ref >39)
LDL Chol Calc (NIH): 78 mg/dL (ref 0–99)
LDL/HDL Ratio: 1.2 {ratio} (ref 0.0–3.2)
Triglycerides: 60 mg/dL (ref 0–149)
VLDL Cholesterol Cal: 12 mg/dL (ref 5–40)

## 2023-02-25 LAB — INSULIN, RANDOM: INSULIN: 17.2 u[IU]/mL (ref 2.6–24.9)

## 2023-02-25 LAB — VITAMIN D 25 HYDROXY (VIT D DEFICIENCY, FRACTURES): Vit D, 25-Hydroxy: 77.8 ng/mL (ref 30.0–100.0)

## 2023-02-25 LAB — VITAMIN B12: Vitamin B-12: 608 pg/mL (ref 232–1245)

## 2023-03-24 ENCOUNTER — Telehealth (INDEPENDENT_AMBULATORY_CARE_PROVIDER_SITE_OTHER): Payer: Self-pay

## 2023-03-24 ENCOUNTER — Encounter (INDEPENDENT_AMBULATORY_CARE_PROVIDER_SITE_OTHER): Payer: Self-pay

## 2023-03-24 ENCOUNTER — Ambulatory Visit (INDEPENDENT_AMBULATORY_CARE_PROVIDER_SITE_OTHER): Payer: Managed Care, Other (non HMO) | Admitting: Physician Assistant

## 2023-03-24 ENCOUNTER — Encounter (INDEPENDENT_AMBULATORY_CARE_PROVIDER_SITE_OTHER): Payer: Self-pay | Admitting: Physician Assistant

## 2023-03-24 VITALS — BP 123/80 | HR 84 | Temp 98.1°F | Ht <= 58 in | Wt 178.0 lb

## 2023-03-24 DIAGNOSIS — R7303 Prediabetes: Secondary | ICD-10-CM

## 2023-03-24 DIAGNOSIS — R632 Polyphagia: Secondary | ICD-10-CM | POA: Diagnosis not present

## 2023-03-24 DIAGNOSIS — Z6837 Body mass index (BMI) 37.0-37.9, adult: Secondary | ICD-10-CM

## 2023-03-24 DIAGNOSIS — F439 Reaction to severe stress, unspecified: Secondary | ICD-10-CM

## 2023-03-24 DIAGNOSIS — E559 Vitamin D deficiency, unspecified: Secondary | ICD-10-CM

## 2023-03-24 MED ORDER — BUPROPION HCL ER (SR) 150 MG PO TB12: 150.0000 mg | ORAL_TABLET | Freq: Every day | ORAL | 0 refills | Status: DC

## 2023-03-24 MED ORDER — VITAMIN D (ERGOCALCIFEROL) 1.25 MG (50000 UNIT) PO CAPS: 50000.0000 [IU] | ORAL_CAPSULE | ORAL | 0 refills | Status: DC

## 2023-03-24 NOTE — Telephone Encounter (Signed)
 See my chart message

## 2023-03-24 NOTE — Progress Notes (Signed)
 SUBJECTIVE: Discussed the use of AI scribe software for clinical note transcription with the patient, who gave verbal consent to proceed.  Chief Complaint: Obesity  Interim History: Mary Odonnell has maintained her weight since last visit. Down 47 lbs overall TBW loss of 20.9% Zepbound was increased to 12.5 mg weekly by prescribing provider at [facility] over the past week.   Muscle mass - 0.8 lbs Adipose mass +0.8 lbs  Mary Odonnell is here to discuss her progress with her obesity treatment plan. Mary Odonnell is on the Category 1 Plan and states Mary Odonnell is following her eating plan approximately 30 % of the time. Mary Odonnell states Mary Odonnell is exercising 20 minutes 3 times per week. The patient, with obesity, presents for follow-up of her obesity treatment plan.  Mary Odonnell is adhering to her category one obesity treatment plan approximately 30% of the time. Her weight has remained stable over the past few weeks, but Mary Odonnell has lost 47 pounds from her initial weight. Mary Odonnell is focused on maintaining her weight loss and is cautious about not regaining weight, especially after observing a colleague's experience with weight regain post-treatment.  Mary Odonnell exercises for 20 minutes three times per week. Although Mary Odonnell has not resumed her walking routine despite the warmer weather, Mary Odonnell uses the treadmill and occasionally lifts weights.  Mary Odonnell is working on improving her water intake and is mindful of her diet, choosing protein-based snacks and reducing heavy meals and dining out. Mary Odonnell is experimenting with new foods and recipes, such as dark chocolate hummus with pretzels, and aims to maintain a balanced diet with more home-cooked meals including vegetables and proteins. Mary Odonnell is attentive to portion sizes and indulgences, opting for smaller portions like a small popcorn at the movies and avoiding sugary drinks.  Mary Odonnell experiences emotional and stress eating and is attempting to manage her stress. Mary Odonnell expresses a desire to reduce her stress medications- Wellbutrin which  was started for stress related emotional eating.  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 Her Zepbound was increased by her provider at [facility] last week.    Mary Odonnell has a history of vitamin D deficiency and is currently on Ergocalciferol 50,000 units once weekly. Mary Odonnell has requested a refill.  OBJECTIVE: Visit Diagnoses: Problem List Items Addressed This Visit     Prediabetes - Primary   Vitamin D deficiency   Relevant Medications   Vitamin D, Ergocalciferol, (DRISDOL) 1.25 MG (50000 UNIT) CAPS capsule   Stress   Relevant Medications   buPROPion (WELLBUTRIN SR) 150 MG 12 hr tablet   Morbid obesity (HCC)-START BMI 47.03   Polyphagia   Other Visit Diagnoses       BMI 37.0-37.9, adult Current BMI 37.3         Obesity Mary Odonnell is on a category one obesity treatment plan with approximately 30% adherence.  Mary Odonnell exercises for 20 minutes three times weekly and has lost 47 pounds from her starting weight.  Her weight is stable, and her visceral adipose rating is at goal.  Mary Odonnell is improving her diet by reducing portion sizes, avoiding late-night heavy meals, and choosing healthier options. Mary Odonnell occasionally uses the treadmill and weights and aims to establish a consistent routine. Mindfulness and conscious food choices are emphasized for Marland-term success. Mary Odonnell is considering the impact of medication coverage changes on her progress. - Encourage adherence to the category one obesity treatment plan. - Increase exercise frequency and duration as tolerated. - Continue mindful eating and healthier food choices. - Support efforts to reduce portion sizes and avoid  late-night heavy meals. - Encourage exploration of protein-based snacks and meal planning. - Reinforce 80% adherence to healthy eating habits for Tison-term success and regular exercise program to maintain muscle mass.    Prediabetes with polyphagia Mary Odonnell manages prediabetes with lifestyle changes, including diet and exercise and Zepbound. Mary Odonnell  is working on exercise consistency and focuses on adequate protein and water intake. Mary Odonnell explores stress management techniques, such as reading and puzzles, to address emotional and stress eating.Polyphagia is well controlled . A1c in goal range. Insulin level still elevated above goal of <6 Lab Results  Component Value Date   HGBA1C 5.5 02/24/2023   HGBA1C 5.7 11/22/2022   HGBA1C 5.5 08/05/2022   Lab Results  Component Value Date   LDLCALC 78 02/24/2023   CREATININE 1.00 02/24/2023   INSULIN  Date Value Ref Range Status  02/24/2023 17.2 2.6 - 24.9 uIU/mL Final  ] - Encourage continued lifestyle modifications, including regular exercise and healthy eating habits. Continue working on nutrition plan to decrease simple carbohydrates, increase lean proteins and exercise to promote weight loss, improve glycemic control and prevent progression to Type 2 diabetes.  - Consider reducing stress medication if stress management techniques are effective.   Emotional eating/stress eating Mary Odonnell manages emotional and stress eating by focusing on stress management, adequate protein and water intake, and healthier food choices. Mary Odonnell considers reducing Wellbutrin dosage but will keep it available for increased cravings or stress-related eating. Mindfulness practices and weight maintenance are encouraged. - Continue current stress management techniques, including reading and puzzles. - Consider stopping Wellbutrin, but keep it available in case of increased cravings or stress. Can start by taking every other day and then stop.  - Focus on maintaining current weight loss and developing Broxson-term maintenance strategies.  Vitamin D Deficiency Vitamin D is at goal of 50.  Most recent vitamin D level was 77. Mary Odonnell is on  prescription ergocalciferol 50,000 IU weekly. Lab Results  Component Value Date   VD25OH 77.8 02/24/2023   VD25OH 46.9 07/01/2022   VD25OH 47.3 12/14/2021    Plan: Continue and decrease  dose  prescription ergocalciferol 50,000 IU every 14 days Low vitamin D levels can be associated with adiposity and may result in leptin resistance and weight gain. Also associated with fatigue.  Currently on vitamin D supplementation without any adverse effects such as nausea, vomiting or muscle weakness.  Recheck vitamin D level several times yearly to optimize supplementation/ avoid over supplemenation  Vitals Temp: 98.1 F (36.7 C) BP: 123/80 Pulse Rate: 84 SpO2: 99 %   Anthropometric Measurements Height: 4\' 10"  (1.473 m) Weight: 178 lb (80.7 kg) BMI (Calculated): 37.21 Weight at Last Visit: 178 lb Weight Lost Since Last Visit: 0 lb Weight Gained Since Last Visit: 0 lb Starting Weight: 225 lb Total Weight Loss (lbs): 47 lb (21.3 kg) Peak Weight: 232 lb   Body Composition  Body Fat %: 42.5 % Fat Mass (lbs): 76 lbs Muscle Mass (lbs): 97.4 lbs Total Body Water (lbs): 69 lbs Visceral Fat Rating : 12   Other Clinical Data Fasting: yes Labs: no Today's Visit #: 46 Starting Date: 06/12/20     ASSESSMENT AND PLAN:  Diet: Venisa is currently in the action stage of change. As such, her goal is to continue with weight loss efforts and has agreed to the Category 1 Plan.   Exercise:  For substantial health benefits, adults should do at least 150 minutes (2 hours and 30 minutes) a week of moderate-intensity, or 75 minutes (  1 hour and 15 minutes) a week of vigorous-intensity aerobic physical activity, or an equivalent combination of moderate- and vigorous-intensity aerobic activity. Aerobic activity should be performed in episodes of at least 10 minutes, and preferably, it should be spread throughout the week. and Adults should also include muscle-strengthening activities that involve all major muscle groups on 2 or more days a week.  Behavior Modification:  We discussed the following Behavioral Modification Strategies today: increasing lean protein intake, decreasing simple  carbohydrates, increasing vegetables, increase H2O intake, increase high fiber foods, meal planning and cooking strategies, avoiding temptations, and planning for success. We discussed various medication options to help Kiernan with her weight loss efforts and we both agreed to continue Wellbutrin but consider tapering and monitor for increased cravings, continue Zepbound 12.5 mg weekly per prescribing provider and continue to work on nutritional and behavioral strategies to promote weight loss.  .  No follow-ups on file.Marland Kitchen Mary Odonnell was informed of the importance of frequent follow up visits to maximize her success with intensive lifestyle modifications for her multiple health conditions.  Attestation Statements:   Reviewed by clinician on day of visit: allergies, medications, problem list, medical history, surgical history, family history, social history, and previous encounter notes.   Time spent on visit including pre-visit chart review and post-visit care and charting was 42 minutes  Schelly Chuba,PA-C

## 2023-03-24 NOTE — Telephone Encounter (Signed)
-----   Message from [facility] sent at 03/24/2023  9:54 AM EDT ----- Regarding: Ergocalciferol decrease Can someone call Pam Crisman and let her know her Ergocalciferol is decreased to every other week (every 14 days) now please.  Thanks [employer]

## 2023-04-19 NOTE — Progress Notes (Signed)
 SUBJECTIVE: Discussed the use of AI scribe software for clinical note transcription with the patient, who gave verbal consent to proceed.  Chief Complaint: Obesity  Interim History: She has maintained her weight since last visit.  Down 47 lbs overall.  TBW loss of 20.9%  Mary Odonnell is here to discuss her progress with her obesity treatment plan. She is on the Category 1 Plan and states she is following her eating plan approximately 75 % of the time. She states she is exercising walking minutes 2-3 times per week.  Mary Odonnell is a 58 year old female with obesity who presents for follow-up of her obesity treatment plan.  She has been on Zepbound which was increased to 15 mg two weeks ago by her prescribing provider.  She has maintained her weight since the last visit but notes a personal scale weight loss of two pounds. She has experienced non-scale victories such as fitting into smaller clothing sizes and improved vision, which her eye doctor attributed to weight loss.  Her dietary habits include ensuring she eats breakfast, often consisting of eggs and wheat crackers with cheese. Lunch is typically the main meal of the day, and she tries to include protein in her evening meals, such as chicken sandwiches or vegetables. She is mindful of her protein intake, aiming for 75 grams per day, and tries to avoid starchy foods. She also attempts to consume more fruits and vegetables, adjusting her intake based on seasonal availability.  She engages in physical activities such as walking during weekend outings, including trips to [employer] and [employer], which help her stay active and avoid overeating. She mentions a preference for home activities like watching movies to avoid high-calorie snacks at theaters.  She experiences occasional constipation and manages it with Clearlax or Colace every other day. She also takes Wellbutrin for stress/emotional eating and tends to take on Monday, Wednesday, and  Friday only, but also has times where she is taking it more regularly with increased work stress. She has been under stress due to work-related issues, including an employee quitting unexpectedly.  No systemic reactions to Zepbound, but there is localized itching at the injection site, which resolves within 24-48 hours. She makes sure to take her usual allergy medications the day before and following her days of injection administration.  Her blood pressure is well-controlled, and she continues to take vitamin D supplements every 14 days.  OBJECTIVE: Visit Diagnoses: Problem List Items Addressed This Visit     Essential hypertension   Prediabetes - Primary   Vitamin D deficiency   Relevant Medications   Vitamin D, Ergocalciferol, (DRISDOL) 1.25 MG (50000 UNIT) CAPS capsule   Stress   Relevant Medications   buPROPion (WELLBUTRIN SR) 150 MG 12 hr tablet   Morbid obesity (HCC)-START BMI 47.03   Relevant Medications   ZEPBOUND 15 MG/0.5ML Pen   Polyphagia   Other Visit Diagnoses       BMI 37.0-37.9, adult Current BMI 37.2         Obesity with polyphagia She is on a weight management plan with Zepbound, currently at 15 mg weekly. She has experienced non-scale victories such as fitting into smaller clothing sizes and improved vision, attributed to weight loss. Weight has remained stable since the last visit, but she reports a 2-pound loss on her home scale. Engaging in regular physical activities and making dietary adjustments to support weight loss. Concerns about adequate protein intake and constipation due to medication. Discussed the potential for new  oral medications that may be as effective as injectables, which could help with cost and administration issues. Current self-pay options for Zepbound and Mary Odonnell are available but remain costly. Emphasized the importance of maintaining protein intake to prevent muscle loss and ensure nutritional adequacy.   - Continue Zepbound 15 mg weekly    - Encourage 75 grams of protein intake daily   - Advise on maintaining hydration with 100 ounces of water daily   - Continue physical activities and dietary adjustments   - Monitor for constipation and manage with Clearlax or Colace as needed   - Consider future oral medication options as they become available   - Schedule follow-up appointment on May 5th at 9 AM    Stress/Emotional eating   She is managing emotional eating with lifestyle changes and support from medication. Continues to make conscious dietary choices to support weight management. Bupropion is used as a backup plan in case of issues with current medication access.   - Continue bupropion as needed for emotional support    Hypertension   Blood pressure is well-controlled at 124/84 mmHg.    Vitamin D deficiency   She is on a vitamin D supplementation regimen, taking it every 14 days. Levels have improved.   - Refill vitamin D prescription Meds ordered this encounter  Medications   buPROPion (WELLBUTRIN SR) 150 MG 12 hr tablet    Sig: Take 1 tablet (150 mg total) by mouth daily.    Dispense:  30 tablet    Refill:  0   Vitamin D, Ergocalciferol, (DRISDOL) 1.25 MG (50000 UNIT) CAPS capsule    Sig: Take 1 capsule (50,000 Units total) by mouth every 14 (fourteen) days.    Dispense:  2 capsule    Refill:  0    Vitals Temp: 97.7 F (36.5 C) BP: 124/84 Pulse Rate: 77 SpO2: 98 %   Anthropometric Measurements Height: 4\' 10"  (1.473 m) Weight: 178 lb (80.7 kg) BMI (Calculated): 37.21 Weight at Last Visit: 178 lb Weight Lost Since Last Visit: 0 Weight Gained Since Last Visit: 0 Starting Weight: 225 lb Total Weight Loss (lbs): 47 lb (21.3 kg) Peak Weight: 232 lb   Body Composition  Body Fat %: 42.9 % Fat Mass (lbs): 76.4 lbs Muscle Mass (lbs): 96.4 lbs Total Body Water (lbs): 70.6 lbs Visceral Fat Rating : 12   Other Clinical Data Fasting: no Labs: no Today's Visit #: 63 Starting Date:  06/12/20     ASSESSMENT AND PLAN:  Diet: Mary Odonnell is currently in the action stage of change. As such, her goal is to continue with weight loss efforts. She has agreed to Category 1 Plan.  Exercise: Mary Odonnell has been instructed to work up to a goal of 150 minutes of combined cardio and strengthening exercise per week for weight loss and overall health benefits.   Behavior Modification:  We discussed the following Behavioral Modification Strategies today: increasing lean protein intake, decreasing simple carbohydrates, increasing vegetables, increase H2O intake, increase high fiber foods, no skipping meals, avoiding temptations, and planning for success. We discussed various medication options to help Mary Odonnell with her weight loss efforts and we both agreed to continue current treatment plan, continue to work on nutritional and behavioral strategies to promote weight loss.  .  Return in about 4 weeks (around 05/18/2023).Mary Odonnell She was informed of the importance of frequent follow up visits to maximize her success with intensive lifestyle modifications for her multiple health conditions.  Attestation Statements:   Reviewed by clinician  on day of visit: allergies, medications, problem list, medical history, surgical history, family history, social history, and previous encounter notes.   Time spent on visit including pre-visit chart review and post-visit care and charting was 34 minutes.    Mary Wilensky, PA-C

## 2023-04-20 ENCOUNTER — Encounter (INDEPENDENT_AMBULATORY_CARE_PROVIDER_SITE_OTHER): Payer: Self-pay | Admitting: Physician Assistant

## 2023-04-20 ENCOUNTER — Other Ambulatory Visit (INDEPENDENT_AMBULATORY_CARE_PROVIDER_SITE_OTHER): Payer: Self-pay | Admitting: Physician Assistant

## 2023-04-20 ENCOUNTER — Ambulatory Visit (INDEPENDENT_AMBULATORY_CARE_PROVIDER_SITE_OTHER): Payer: Managed Care, Other (non HMO) | Admitting: Physician Assistant

## 2023-04-20 VITALS — BP 124/84 | HR 77 | Temp 97.7°F | Ht <= 58 in | Wt 178.0 lb

## 2023-04-20 DIAGNOSIS — E559 Vitamin D deficiency, unspecified: Secondary | ICD-10-CM

## 2023-04-20 DIAGNOSIS — I10 Essential (primary) hypertension: Secondary | ICD-10-CM

## 2023-04-20 DIAGNOSIS — Z6837 Body mass index (BMI) 37.0-37.9, adult: Secondary | ICD-10-CM

## 2023-04-20 DIAGNOSIS — R632 Polyphagia: Secondary | ICD-10-CM | POA: Diagnosis not present

## 2023-04-20 DIAGNOSIS — F439 Reaction to severe stress, unspecified: Secondary | ICD-10-CM

## 2023-04-20 DIAGNOSIS — R7303 Prediabetes: Secondary | ICD-10-CM

## 2023-04-20 MED ORDER — VITAMIN D (ERGOCALCIFEROL) 1.25 MG (50000 UNIT) PO CAPS: 50000.0000 [IU] | ORAL_CAPSULE | ORAL | 0 refills | Status: DC

## 2023-04-20 MED ORDER — BUPROPION HCL ER (SR) 150 MG PO TB12
150.0000 mg | ORAL_TABLET | Freq: Every day | ORAL | 0 refills | Status: DC
Start: 2023-04-20 — End: 2023-05-16

## 2023-05-02 ENCOUNTER — Ambulatory Visit: Payer: Managed Care, Other (non HMO) | Admitting: Dermatology

## 2023-05-02 ENCOUNTER — Encounter: Payer: Self-pay | Admitting: Dermatology

## 2023-05-02 VITALS — BP 126/86

## 2023-05-02 DIAGNOSIS — L720 Epidermal cyst: Secondary | ICD-10-CM | POA: Diagnosis not present

## 2023-05-02 DIAGNOSIS — L810 Postinflammatory hyperpigmentation: Secondary | ICD-10-CM | POA: Diagnosis not present

## 2023-05-02 DIAGNOSIS — L729 Follicular cyst of the skin and subcutaneous tissue, unspecified: Secondary | ICD-10-CM

## 2023-05-02 MED ORDER — SAFETY SEAL MISCELLANEOUS MISC: 1.0000 | Freq: Two times a day (BID) | 8 refills | Status: AC

## 2023-05-02 NOTE — Patient Instructions (Addendum)
 Hello Mary Odonnell,  Thank you for visiting today. Here is a summary of the key instructions:  - Skin Care:   - Use water wipes with no fragrance or preservatives for the groin area to prevent flares of the cyst   - Find water wipes on [employer]  - Hyperpigmentation Treatment:   - Increase melasma treatment to twice a day   - Use Eucerin Radiant Tone with thiametol morning and night   - Continue using MedRock tranexamic acid day and night   - In the morning, mix Radiant Tone and tranexamic acid, then apply sunscreen on top  - Sun Protection:   - Be diligent with sunscreen application, especially during summer   - Reapply sunscreen throughout the day   - Use sunscreen to prevent dark spots from getting darker  - Medications:   - A refill for tranexamic acid will be sent  - Follow-up:   - Return for a follow-up appointment in 6 months  Please reach out if you have any questions or concerns.  Warm regards,  Dr. Louana Roup, Dermatology   Important Information  Due to recent changes in healthcare laws, you may see results of your pathology and/or laboratory studies on MyChart before the doctors have had a chance to review them. We understand that in some cases there may be results that are confusing or concerning to you. Please understand that not all results are received at the same time and often the doctors may need to interpret multiple results in order to provide you with the best plan of care or course of treatment. Therefore, we ask that you please give us  2 business days to thoroughly review all your results before contacting the office for clarification. Should we see a critical lab result, you will be contacted sooner.   If You Need Anything After Your Visit  If you have any questions or concerns for your doctor, please call our main line at [phone] If no one answers, please leave a voicemail as directed and we will return your call as soon as possible. Messages left after  4 pm will be answered the following business day.   You may also send us  a message via MyChart. We typically respond to MyChart messages within 1-2 business days.  For prescription refills, please ask your pharmacy to contact our office. Our fax number is [phone].  If you have an urgent issue when the clinic is closed that cannot wait until the next business day, you can page your doctor at the number below.    Please note that while we do our best to be available for urgent issues outside of office hours, we are not available 24/7.   If you have an urgent issue and are unable to reach us , you may choose to seek medical care at your doctor's office, retail clinic, urgent care center, or emergency room.  If you have a medical emergency, please immediately call 911 or go to the emergency department. In the event of inclement weather, please call our main line at [phone] for an update on the status of any delays or closures.  Dermatology Medication Tips: Please keep the boxes that topical medications come in in order to help keep track of the instructions about where and how to use these. Pharmacies typically print the medication instructions only on the boxes and not directly on the medication tubes.   If your medication is too expensive, please contact our office at [phone] or send us  a message  through MyChart.   We are unable to tell what your co-pay for medications will be in advance as this is different depending on your insurance coverage. However, we may be able to find a substitute medication at lower cost or fill out paperwork to get insurance to cover a needed medication.   If a prior authorization is required to get your medication covered by your insurance company, please allow us  1-2 business days to complete this process.  Drug prices often vary depending on where the prescription is filled and some pharmacies may offer cheaper prices.  The website www.goodrx.com  contains coupons for medications through different pharmacies. The prices here do not account for what the cost may be with help from insurance (it may be cheaper with your insurance), but the website can give you the price if you did not use any insurance.  - You can print the associated coupon and take it with your prescription to the pharmacy.  - You may also stop by our office during regular business hours and pick up a GoodRx coupon card.  - If you need your prescription sent electronically to a different pharmacy, notify our office through [facility] or by phone at [phone]

## 2023-05-02 NOTE — Progress Notes (Signed)
   Follow-Up Visit   Subjective  Mary Odonnell is a 58 y.o. female who presents for the following: PIH  Patient present today for follow up visit. Patient was last evaluated on 11/01/23. At this visit patient was prescribed Medrock Melaxemic Cream. Pt stated that she is using the cream about 4x/week.  Patient reports sxs are better. Patient denies medication changes.  The following portions of the chart were reviewed this encounter and updated as appropriate: medications, allergies, medical history  Review of Systems:  No other skin or systemic complaints except as noted in HPI or Assessment and Plan.  Objective  Well appearing patient in no apparent distress; mood and affect are within normal limits.   A focused examination was performed of the following areas: chin   Relevant exam findings are noted in the Assessment and Plan.        Assessment & Plan    1. PIH - Assessment: Patient was seen in October for hyperpigmentation from ingrown hairs on the chin. Prescribed lightening cream with tranexamic acid, used a few times a week instead of daily. Patient reports approximately 30% improvement, with fluctuations in efficacy. Continues to pick at ingrown hairs, though less frequently. Clinical examination reveals improvement in hyperpigmentation.  - Plan:    Continue melasma treatment, increasing application to twice daily    Prescribe new lightening cream: Eucerin Radiant Tone with thiamidol     - Apply morning and night    Refill prescription for tranexamic acid (MedRock)     - Apply day and night    Morning regimen: Apply thiametol and tranexamic acid, followed by sunscreen    Provide samples of Radiant Tone Serum and sunscreen    Emphasize importance of diligent sunscreen use and reapplication, especially during summer months    Schedule follow-up in 6 months for comparison with current photographs  2. Epidermal Inclusion Cyst - Assessment: Patient reports a stable groin  cyst that fluctuates in size. The cyst was previously discussed during the October visit. - Plan:    Recommend use of water wipes (no fragrance or preservatives, sterile saline) for hygiene    Advise against use of wipes containing Aloe Vera    No follow-ups on file.    Documentation: I have reviewed the above documentation for accuracy and completeness, and I agree with the above.   I, Shirron Louanne Roussel, CMA, am acting as scribe for [employer], DO.   Louana Roup, DO

## 2023-05-15 NOTE — Progress Notes (Unsigned)
 SUBJECTIVE: Discussed the use of AI scribe software for clinical note transcription with the patient, who gave verbal consent to proceed.  Chief Complaint: Obesity  Interim History: She is down 3 lbs since last visit. Down 50 lb overall TBW loss of 22.2% Muscle mass + 1.6 lbs over the past 4 weeks Adipose mass - 4.0 lbs over the past 4 weeks.   Since starting with [facility] 06/12/2020:  Muscle mass - 5.8 lbs total  Adipose mass - 43.4 lbs total - EXCELLENT !  Mary Odonnell is here to discuss her progress with her obesity treatment plan. She is on the Category 1 Plan and states she is following her eating plan approximately 40 % of the time. She states she is exercising walking for 40 minutes 3 times per week.  Mary Odonnell is a 58 year old female with obesity, prediabetes, and hypertension who presents for follow-up of her obesity treatment plan.  She has achieved a total weight loss of fifty-seven pounds, with fifty pounds lost on the current program with [facility].  She feels significantly better and is pleased with her progress. She is currently on Zepbound , with a recent dose adjustment from fifteen to twelve point five due to nausea and low blood sugar episodes. She monitors her weight weekly and logs it as part of her program requirements through her workplace requirements.  She has a history of prediabetes and has been monitoring her blood sugar levels. She experiences episodes of low blood sugar, with readings in the seventies, and occasional readings in the low hundreds. She attributes some of the fluctuations to her dietary intake, particularly in the evenings when she sometimes eats less to accommodate her husband's dietary restrictions. We discussed strategies to include more protein in her diet to stabilize her blood sugar levels overnight to avoid lows.  Her hypertension is currently well-managed, with a recent blood pressure reading of 131/83 mmHg. She continues to take bupropion  and vitamin D   supplements- Ergocalciferol  50,000 units every 14 days.  She is actively engaging in physical activities and planning to join a gym.  We discussed considering [facility] RIght start program to work on some weight training and other activities to maintain muscle mass.  She participates in [employer] seminars and classes related to her weight loss program and finds them beneficial. She also maintains an active lifestyle by planning weekend activities and trips with her family. OBJECTIVE: Visit Diagnoses: Problem List Items Addressed This Visit     Essential hypertension   Prediabetes - Primary   Vitamin D  deficiency   Relevant Medications   Vitamin D , Ergocalciferol , (DRISDOL ) 1.25 MG (50000 UNIT) CAPS capsule   Stress   Relevant Medications   buPROPion  (WELLBUTRIN  SR) 150 MG 12 hr tablet   Morbid obesity (HCC)-START BMI 47.03   Polyphagia   Other Visit Diagnoses       BMI 36.0-36.9,adult Current BMI 36.7         Obesity with polyphagia Significant weight loss achieved with a total of 57 pounds lost, 50 lbs during her treatment with [facility]. Current weight is 175 pounds with corresponding BMI of 36.7.  Zepbound  dose adjusted from 15 mg to 12.5 mg weekly due to nausea and hypoglycemic episodes per [employer] provider per company policy.  Actively participating in a weight management program and considering joining a gym with a structured program- Right start at [facility].  Muscle mass has increased, over the past 4 weeks and body fat percentage is decreasing, currently at 41.2%  with a goal of 35% or less. Emphasizes the importance of maintaining muscle mass to sustain weight loss. - Continue Zepbound  at 12.5 mg weekly. - Encourage participation in the [facility] program at the gym. - Emphasize the importance of protein intake, especially with increased physical activity. - Document muscle mass in the chart.  Prediabetes A1c is well-controlled at 5.5%. Experiences occasional hypoglycemic episodes,  likely related to dietary intake and Zepbound . Emphasis on maintaining stable blood glucose levels through adequate protein intake, especially in the evening. Lab Results  Component Value Date   HGBA1C 5.5 02/24/2023   HGBA1C 5.7 11/22/2022   HGBA1C 5.5 08/05/2022   Lab Results  Component Value Date   LDLCALC 78 02/24/2023   CREATININE 1.00 02/24/2023   INSULIN   Date Value Ref Range Status  02/24/2023 17.2 2.6 - 24.9 uIU/mL Final  ]Continue working on nutrition plan to decrease simple carbohydrates, increase lean proteins and exercise to promote weight loss, improve glycemic control and prevent progression to Type 2 diabetes.  - Monitor blood glucose levels regularly. - Ensure adequate protein intake, particularly in the evening.  Hypertension Blood pressure is well-controlled at 131/83 mmHg. Heart rate is 85 bpm. BP Readings from Last 3 Encounters:  05/16/23 131/83  05/02/23 126/86  04/20/23 124/84   Lab Results  Component Value Date   NA 143 02/24/2023   CL 105 02/24/2023   K 4.4 02/24/2023   CO2 24 02/24/2023   BUN 11 02/24/2023   CREATININE 1.00 02/24/2023   EGFR 66 02/24/2023   CALCIUM 9.1 02/24/2023   ALBUMIN 4.3 02/24/2023   GLUCOSE 78 02/24/2023   Continue to work on nutrition plan to promote weight loss and improve BP control.  On amlodipine 10 mg daily- continue current medication. No symptoms of hypotension.  Monitor closely with continued weight loss and treatment with Zepbound .   Vitamin D  Deficiency Vitamin D  is at goal of 50.  Most recent vitamin D  level was 77.8. She is on  prescription ergocalciferol  50,000 IU every 14 days. No N/V or muscle weakness with Ergo.  Lab Results  Component Value Date   VD25OH 77.8 02/24/2023   VD25OH 46.9 07/01/2022   VD25OH 47.3 12/14/2021    Plan: Continue and refill  prescription ergocalciferol  50,000 IU every 14 days Low vitamin D  levels can be associated with adiposity and may result in leptin resistance and  weight gain. Also associated with fatigue.  Currently on vitamin D  supplementation without any adverse effects such as nausea, vomiting or muscle weakness.  Meds ordered this encounter  Medications   buPROPion  (WELLBUTRIN  SR) 150 MG 12 hr tablet    Sig: Take 1 tablet (150 mg total) by mouth daily.    Dispense:  30 tablet    Refill:  0   Vitamin D , Ergocalciferol , (DRISDOL ) 1.25 MG (50000 UNIT) CAPS capsule    Sig: Take 1 capsule (50,000 Units total) by mouth every 14 (fourteen) days.    Dispense:  2 capsule    Refill:  0   Eating disorder/emotional eating/Stress Nahlani has had issues with stress/emotional eating. Currently this is well controlled. Overall mood is stable. Medication(s): Bupropion  SR 150 mg daily in am  Plan: Continue and refill Bupropion  SR 150 mg daily in am  Meds ordered this encounter  Medications   buPROPion  (WELLBUTRIN  SR) 150 MG 12 hr tablet    Sig: Take 1 tablet (150 mg total) by mouth daily.    Dispense:  30 tablet    Refill:  0  Vitamin D , Ergocalciferol , (DRISDOL ) 1.25 MG (50000 UNIT) CAPS capsule    Sig: Take 1 capsule (50,000 Units total) by mouth every 14 (fourteen) days.    Dispense:  2 capsule    Refill:  0    Vitals Temp: 97.8 F (36.6 C) BP: 131/83 Pulse Rate: 85 SpO2: 100 %   Anthropometric Measurements Height: 4\' 10"  (1.473 m) Weight: 175 lb (79.4 kg) BMI (Calculated): 36.58 Weight at Last Visit: 178 lb Weight Lost Since Last Visit: 3 lb Weight Gained Since Last Visit: 0 Starting Weight: 225 lb Total Weight Loss (lbs): 50 lb (22.7 kg) Peak Weight: 232 lb   Body Composition  Body Fat %: 41.2 % Fat Mass (lbs): 72.4 lbs Muscle Mass (lbs): 98 lbs Total Body Water (lbs): 70 lbs Visceral Fat Rating : 12   Other Clinical Data Fasting: yes Labs: no Today's Visit #: 48 Starting Date: 06/12/20     ASSESSMENT AND PLAN:  Diet: Rosamond is currently in the action stage of change. As such, her goal is to continue with weight  loss efforts. She has agreed to Category 1 Plan.  Exercise: Cubie has been instructed to work up to a goal of 150 minutes of combined cardio and strengthening exercise per week for weight loss and overall health benefits.   Behavior Modification:  We discussed the following Behavioral Modification Strategies today: increasing lean protein intake, decreasing simple carbohydrates, increasing vegetables, increase H2O intake, increase high fiber foods, no skipping meals, meal planning and cooking strategies, avoiding temptations, and planning for success. We discussed various medication options to help Eimmy with her weight loss efforts and we both agreed to continue current treatment plan and continue to work on nutritional and behavioral strategies to promote weight loss.  .  Return in about 4 weeks (around 06/13/2023).Aaron Aas She was informed of the importance of frequent follow up visits to maximize her success with intensive lifestyle modifications for her multiple health conditions.  Attestation Statements:   Reviewed by clinician on day of visit: allergies, medications, problem list, medical history, surgical history, family history, social history, and previous encounter notes.   Time spent on visit including pre-visit chart review and post-visit care and charting was 34 minutes.    Deklyn Trachtenberg, PA-C

## 2023-05-16 ENCOUNTER — Ambulatory Visit (INDEPENDENT_AMBULATORY_CARE_PROVIDER_SITE_OTHER): Admitting: Physician Assistant

## 2023-05-16 ENCOUNTER — Encounter (INDEPENDENT_AMBULATORY_CARE_PROVIDER_SITE_OTHER): Payer: Self-pay | Admitting: Physician Assistant

## 2023-05-16 VITALS — BP 131/83 | HR 85 | Temp 97.8°F | Ht <= 58 in | Wt 175.0 lb

## 2023-05-16 DIAGNOSIS — R632 Polyphagia: Secondary | ICD-10-CM

## 2023-05-16 DIAGNOSIS — I10 Essential (primary) hypertension: Secondary | ICD-10-CM

## 2023-05-16 DIAGNOSIS — E559 Vitamin D deficiency, unspecified: Secondary | ICD-10-CM

## 2023-05-16 DIAGNOSIS — Z6836 Body mass index (BMI) 36.0-36.9, adult: Secondary | ICD-10-CM

## 2023-05-16 DIAGNOSIS — R7303 Prediabetes: Secondary | ICD-10-CM | POA: Diagnosis not present

## 2023-05-16 DIAGNOSIS — F439 Reaction to severe stress, unspecified: Secondary | ICD-10-CM | POA: Diagnosis not present

## 2023-05-16 MED ORDER — VITAMIN D (ERGOCALCIFEROL) 1.25 MG (50000 UNIT) PO CAPS: 50000.0000 [IU] | ORAL_CAPSULE | ORAL | 0 refills | Status: DC

## 2023-05-16 MED ORDER — BUPROPION HCL ER (SR) 150 MG PO TB12: 150.0000 mg | ORAL_TABLET | Freq: Every day | ORAL | 0 refills | Status: DC

## 2023-06-12 NOTE — Progress Notes (Unsigned)
 SUBJECTIVE: Discussed the use of AI scribe software for clinical note transcription with the patient, who gave verbal consent to proceed.  Chief Complaint: Obesity  Interim History: She has maintained her weight loss since last visit.  Down 50 lbs total TBW loss    Mary Odonnell is here to discuss her progress with her obesity treatment plan. She is on the Category 1 Plan and states she is following her eating plan approximately 50 % of the time. She states she is exercising 30 minutes 4 times per week.   OBJECTIVE: Visit Diagnoses: Obesity Obesity management is ongoing with a total weight loss of 50 pounds, maintained over the past four weeks. Current treatment includes Zepbound  12.5 mg weekly. She engages in physical activity, including walking and weightlifting four times per week. Hunger and cravings are well-controlled. She is considering joining a gym to enhance her exercise routine. - Continue Zepbound  12.5 mg weekly - Encourage continued physical activity, including walking and weightlifting - Advise on maintaining a balanced diet with fruits and nuts  Stress and emotional eating Stress and emotional eating are managed with bupropion  SR 150 mg daily. She reports good control over hunger and cravings and focuses on maintaining a balanced diet with fruits and nuts to support her emotional well-being. - Continue bupropion  SR 150 mg daily  Hypertension Hypertension is well-controlled with current medications. Blood pressure is 114/79 mmHg. Current medications include Norvasc 10 mg daily and Losartan 100 mg daily. - Continue Norvasc 10 mg daily - Continue Losartan 100 mg daily  Vitamin D  deficiency Vitamin D  deficiency is managed with ergocalciferol  50,000 units every other week. Recent vitamin D  level is 77.8, indicating good control. - Continue ergocalciferol  50,000 units every other week  Headache Recent severe headache possibly related to taking three Caelyx tablets. Headache  was intense and required Aleve for relief. Possible contributing factors include barometric pressure changes. Headaches occurred during menopause. No current prophylactic treatment discussed. She is advised to monitor headache frequency and severity. - Monitor headache frequency and severity - Consider prophylactic treatment if headaches persist   Vitals Temp: 97.7 F (36.5 C) BP: 114/79 Pulse Rate: 75 SpO2: 99 %   Anthropometric Measurements Height: 4\' 10"  (1.473 m) Weight: 175 lb (79.4 kg) BMI (Calculated): 36.58 Weight at Last Visit: 175lb Weight Lost Since Last Visit: 0 Weight Gained Since Last Visit: 0 Starting Weight: 225lb Total Weight Loss (lbs): 50 lb (22.7 kg) Peak Weight: 232lb   Body Composition  Body Fat %: 42 % Fat Mass (lbs): 73.6 lbs Muscle Mass (lbs): 96.2 lbs Total Body Water (lbs): 67.8 lbs Visceral Fat Rating : 12   Other Clinical Data Fasting: yes Labs: no Today's Visit #: 49 Starting Date: 06/12/20     ASSESSMENT AND PLAN:  Diet: Farha {CHL AMB IS/IS NOT:210130109} currently in the action stage of change. As such, her goal is to {[facility] Weight Loss Efforts:210964006}. She {HAS HAS FAO:13086} agreed to {[facility] Weight Loss Plan:210964005}.  Exercise: [name] has been instructed {[facility] Exercise:210964007} for weight loss and overall health benefits.   Behavior Modification:  We discussed the following Behavioral Modification Strategies today: {HWW Behavior Modification:210964008}. We discussed various medication options to help [name] with her weight loss efforts and we both agreed to ***.  No follow-ups on file.Mary Odonnell She was informed of the importance of frequent follow up visits to maximize her success with intensive lifestyle modifications for her multiple health conditions.  Attestation Statements:   Reviewed by clinician on day of visit: allergies, medications,  problem list, medical history, surgical history, family history, social history, and  previous encounter notes.   Time spent on visit including pre-visit chart review and post-visit care and charting was *** minutes.    Mary Bodner, PA-C

## 2023-06-13 ENCOUNTER — Encounter (INDEPENDENT_AMBULATORY_CARE_PROVIDER_SITE_OTHER): Payer: Self-pay | Admitting: Physician Assistant

## 2023-06-13 ENCOUNTER — Ambulatory Visit (INDEPENDENT_AMBULATORY_CARE_PROVIDER_SITE_OTHER): Admitting: Physician Assistant

## 2023-06-13 VITALS — BP 114/79 | HR 75 | Temp 97.7°F | Ht <= 58 in | Wt 175.0 lb

## 2023-06-13 DIAGNOSIS — F439 Reaction to severe stress, unspecified: Secondary | ICD-10-CM

## 2023-06-13 DIAGNOSIS — E559 Vitamin D deficiency, unspecified: Secondary | ICD-10-CM

## 2023-06-13 DIAGNOSIS — G44209 Tension-type headache, unspecified, not intractable: Secondary | ICD-10-CM

## 2023-06-13 DIAGNOSIS — I10 Essential (primary) hypertension: Secondary | ICD-10-CM

## 2023-06-13 DIAGNOSIS — R632 Polyphagia: Secondary | ICD-10-CM | POA: Diagnosis not present

## 2023-06-13 DIAGNOSIS — R7303 Prediabetes: Secondary | ICD-10-CM | POA: Diagnosis not present

## 2023-06-13 DIAGNOSIS — Z6836 Body mass index (BMI) 36.0-36.9, adult: Secondary | ICD-10-CM

## 2023-06-13 NOTE — Progress Notes (Signed)
 SUBJECTIVE: Discussed the use of AI scribe software for clinical note transcription with the patient, who gave verbal consent to proceed.  Chief Complaint: Obesity   Interim History: She has maintained her weight loss since last visit.  Down 50 lbs total TBW loss    Kesleigh is here to discuss her progress with her obesity treatment plan. She is on the Category 1 Plan and states she is following her eating plan approximately 50 % of the time. She states she is exercising 30 minutes 4 times per week.  DARNELLA ZEITER is a 58 year old female who presents for follow-up of her obesity treatment plan.  She has maintained her weight over the past four weeks with a total weight loss of fifty pounds. She is currently on Zepbound  12.5 mg weekly through an alternate provider. She is also taking Norvasc 10 mg daily and losartan 100 mg daily for blood pressure management, and bupropion  SR 150 mg daily for stress and emotional eating. Additionally, she takes ergocalciferol  50,000 units every other week for vitamin D  deficiency.  Yesterday afternoon, she experienced a severe headache that began in the morning.  The headache intensified in the afternoon, prompting her to take Benadryl and Aleve, after which she went to sleep. She described the headache as intense and almost went to the hospital. She also noted her face felt flaky after taking Benadryl. She has a history of headaches throughout her life and is postmenopausal for three to four years, still experiencing night sweats and occasional headaches.  She mentioned a recent issue with a brown spot and itching near the site of her Zepbound  injection on her shoulder, which she initially thought might be a bite. She has been experiencing itching but no issues at the injection site itself. She takes Xyzal  occasionally, more often using a spray than the pill.  Her hunger is well-controlled, and she is trying to incorporate more fruits into her diet, such as  grapes, blueberries, and watermelon. She is physically active, walking and weightlifting four times a week, focusing on arm exercises. She and her husband are considering joining a gym.  She recently visited her daughter in [place]  and enjoyed walking around downtown. Her mother-in-law is currently in a rehabilitation facility after a recent illness, which has impacted her routine.  Patient had labs done elsewhere and will bring in next visit.    OBJECTIVE: Visit Diagnoses: Problem List Items Addressed This Visit     Essential hypertension   Prediabetes - Primary   Vitamin D  deficiency   Stress   Morbid obesity (HCC)-START BMI 47.03   Polyphagia   Other Visit Diagnoses       Acute non intractable tension-type headache         BMI 36.0-36.9,adult Current BMI 36.6         Obesity with polyphagia Obesity management is ongoing with a total weight loss of 50 pounds, maintained over the past four weeks. Current treatment includes Zepbound  12.5 mg weekly. She engages in physical activity, including walking and weightlifting four times per week. Hunger and cravings are well-controlled. She is considering joining a gym to enhance her exercise routine. - Continue Zepbound  12.5 mg weekly- Prescribed by [employer] as required by her employer.  - Encourage continued physical activity, including walking and weightlifting - Advise on maintaining a balanced diet with fruits and nuts - Work on [employer]/film/video consistently.   Prediabetes Last A1c was 5.5/ Insulin  17.2- increased and not at goal.   Medication(s):  Zepbound  12.5 mg SQ weekly Denies mass in neck, dysphagia, dyspepsia, persistent hoarseness, abdominal pain, or N/V/Constipation or diarrhea. Has annual eye exam. Mood is stable.   Polyphagia:No Lab Results  Component Value Date   HGBA1C 5.5 02/24/2023   HGBA1C 5.7 11/22/2022   HGBA1C 5.5 08/05/2022   HGBA1C 5.8 05/26/2022   HGBA1C 5.9 (H) 12/14/2021   Lab Results  Component Value Date    INSULIN  17.2 02/24/2023   INSULIN  11.4 12/14/2021   INSULIN  16.5 06/18/2021   INSULIN  13.9 03/05/2021   INSULIN  13.2 06/12/2020    Plan: Continue Zepbound  12.5 mg SQ weekly- Filled by [employer] provider Continue working on nutrition plan to decrease simple carbohydrates, increase lean proteins and exercise to promote weight loss, improve glycemic control and prevent progression to Type 2 diabetes.    Stress and emotional eating Stress and emotional eating are managed with bupropion  SR 150 mg daily. She reports good control over hunger and cravings and focuses on maintaining a balanced diet with fruits and nuts to support her emotional well-being. No SE with bupropion .  - Continue bupropion  SR 150 mg daily No refill needed this visit.  - Continue to work on emotional eating strategies.   Hypertension Hypertension is well-controlled with current medications. Blood pressure is 114/79 mmHg. Current medications include Norvasc 10 mg daily and Losartan 100 mg daily. - Continue Norvasc 10 mg daily - Continue Losartan 100 mg daily -Continue to work on nutrition plan to promote weight loss and improve BP control.   Vitamin D  deficiency Vitamin D  deficiency is managed with ergocalciferol  50,000 units every other week. Recent vitamin D  level is 77.8, indicating good control. - Continue ergocalciferol  50,000 units every 14 days. No refill needed this visit. -Low vitamin D  levels can be associated with adiposity and may result in leptin resistance and weight gain. Also associated with fatigue.  Currently on vitamin D  supplementation without any adverse effects such as nausea, vomiting or muscle weakness.   Headache Recent severe headache over weekend. Much better today overall. Headache was intense and required Aleve for relief. Possible contributing factors include barometric pressure changes. Headaches occurred during menopause. No current prophylactic treatment discussed. She is advised to monitor  headache frequency and severity. Could consider topamax for prophylaxis of HA as well as craving control, but patient currently reports few cravings while on Zepbound . Would need to discuss other history prior to starting.  - Monitor headache frequency and severity - Consider prophylactic treatment if headaches persist  Vitals Temp: 97.7 F (36.5 C) BP: 114/79 Pulse Rate: 75 SpO2: 99 %   Anthropometric Measurements Height: 4\' 10"  (1.473 m) Weight: 175 lb (79.4 kg) BMI (Calculated): 36.58 Weight at Last Visit: 175lb Weight Lost Since Last Visit: 0 Weight Gained Since Last Visit: 0 Starting Weight: 225lb Total Weight Loss (lbs): 50 lb (22.7 kg) Peak Weight: 232lb   Body Composition  Body Fat %: 42 % Fat Mass (lbs): 73.6 lbs Muscle Mass (lbs): 96.2 lbs Total Body Water (lbs): 67.8 lbs Visceral Fat Rating : 12   Other Clinical Data Fasting: yes Labs: no Today's Visit #: 30 Starting Date: 06/12/20     ASSESSMENT AND PLAN:  Diet: Saliah is currently in the action stage of change. As such, her goal is to continue with weight loss efforts. She has agreed to Category 1 Plan.  Exercise: Rainie has been instructed to work up to a goal of 150 minutes of combined cardio and strengthening exercise per week for weight loss and overall  health benefits.   Behavior Modification:  We discussed the following Behavioral Modification Strategies today: increasing lean protein intake, decreasing simple carbohydrates, increasing vegetables, increase H2O intake, increase high fiber foods, no skipping meals, meal planning and cooking strategies, avoiding temptations, and planning for success. We discussed various medication options to help Karaline with her weight loss efforts and we both agreed to continue to work on nutritional and behavioral strategies to promote weight loss and continue current treatment plan. .  Return in about 4 weeks (around 07/11/2023).Aaron Aas She was informed of the  importance of frequent follow up visits to maximize her success with intensive lifestyle modifications for her multiple health conditions.  Attestation Statements:   Reviewed by clinician on day of visit: allergies, medications, problem list, medical history, surgical history, family history, social history, and previous encounter notes.   Time spent on visit including pre-visit chart review and post-visit care and charting was 38 minutes.    Ketura Sirek, PA-C

## 2023-06-14 ENCOUNTER — Ambulatory Visit (INDEPENDENT_AMBULATORY_CARE_PROVIDER_SITE_OTHER): Admitting: Physician Assistant

## 2023-07-12 NOTE — Progress Notes (Signed)
 SUBJECTIVE: Discussed the use of AI scribe software for clinical note transcription with the patient, who gave verbal consent to proceed.  Chief Complaint: Obesity  Interim History: She is up 1 lb since last visit Down 49 lbs overall TBW loss of 21.8%  Mary Odonnell is here to discuss her progress with her obesity treatment plan. She is on the Category 1 Plan and states she is following her eating plan approximately 55 % of the time. She states she is exercising 20 minutes 4 times per week.  The patient is a 58 year old with obesity who presents for follow-up of her obesity treatment plan.  She is currently on Zepbound  12.5 mg weekly ,having previously been on a 15 mg dose for one to two months.  Her hunger is manageable, and she maintains a routine of three meals a day.  She had a medication break for a colonoscopy, which showed no polyps.  She is on ergocalciferol  50,000 units every 14 days for vitamin D  deficiency, and takes losartan 100 mg and Norvasc 10 mg daily for hypertension.  She takes bupropion  SR 150 mg daily for cravings and emotional eating, with no side effects such as nausea, vomiting, or sleeplessness. She has lost 49 pounds and is working on maintaining this weight loss. She is planning to return to the office three days a week, which may affect her meal planning and daily routine.  She has been incorporating more physical activity, including hiking during a recent vacation and plans to use the gym at her workplace. OBJECTIVE: Visit Diagnoses: Problem List Items Addressed This Visit     Essential hypertension   Prediabetes   Vitamin D  deficiency   Relevant Medications   Vitamin D , Ergocalciferol , (DRISDOL ) 1.25 MG (50000 UNIT) CAPS capsule   Stress   Relevant Medications   buPROPion  (WELLBUTRIN  SR) 150 MG 12 hr tablet   Morbid obesity (HCC)-START BMI 47.03   Polyphagia - Primary   Other Visit Diagnoses       BMI 36.0-36.9,adult Current BMI 36.9         Obesity  with polyphagia She is on Zepbound  12.5 mg for obesity management, previously on 15 mg. Weight has increased slightly, but hunger is well-controlled. She has lost 49 pounds and is considering increasing Zepbound  to 15 mg for further weight loss before transitioning to maintenance. - She plans to discuss increase of Zepbound  to 15 mg for next three months to enhance weight loss, then transition to maintenance phase if approved by [facility] provider - Continue meal planning and preparation -Okay to incorporate nuts, but limit to 1/4 cup of nuts daily.  - Encourage regular physical activity, including using stairs and gym facilities  Prediabetes Last A1c was 5.4- outside testing  Medication(s): Zepbound  12.5 mg SQ weekly and metformin  either 500 mg or 1000 mg daily per PCP and patient is going to confirm dose of metformin .  Polyphagia:No Lab Results  Component Value Date   HGBA1C 5.5 02/24/2023   HGBA1C 5.7 11/22/2022   HGBA1C 5.5 08/05/2022   HGBA1C 5.8 05/26/2022   HGBA1C 5.9 (H) 12/14/2021   Lab Results  Component Value Date   INSULIN  17.2 02/24/2023   INSULIN  11.4 12/14/2021   INSULIN  16.5 06/18/2021   INSULIN  13.9 03/05/2021   INSULIN  13.2 06/12/2020    Plan: Continue and increase dose Zepbound  15 mg SQ weekly per [facility] provider if agreed upon.  Continue working on nutrition plan to decrease simple carbohydrates, increase lean proteins and exercise to promote  weight loss, improve glycemic control and prevent progression to Type 2 diabetes.    Emotional Eating She is on bupropion  SR 150 mg daily for emotional eating and stress management. No side effects reported. - Refill bupropion  SR 150 mg daily Meds ordered this encounter  Medications   Vitamin D , Ergocalciferol , (DRISDOL ) 1.25 MG (50000 UNIT) CAPS capsule    Sig: Take 1 capsule (50,000 Units total) by mouth every 14 (fourteen) days.    Dispense:  2 capsule    Refill:  0   buPROPion  (WELLBUTRIN  SR) 150 MG 12 hr tablet    Sig:  Take 1 tablet (150 mg total) by mouth daily.    Dispense:  30 tablet    Refill:  0    Hypertension Blood pressure is well-controlled on losartan 100 mg and Norvasc 10 mg daily. Recent reading was 131/84 mmHg. BP Readings from Last 3 Encounters:  07/13/23 131/84  06/13/23 114/79  05/16/23 131/83     Vitamin D  Deficiency She is on ergocalciferol  50,000 units every 14 days. No issues reported. - Refill ergocalciferol  50,000 units every 14 days Meds ordered this encounter  Medications   Vitamin D , Ergocalciferol , (DRISDOL ) 1.25 MG (50000 UNIT) CAPS capsule    Sig: Take 1 capsule (50,000 Units total) by mouth every 14 (fourteen) days.    Dispense:  2 capsule    Refill:  0   buPROPion  (WELLBUTRIN  SR) 150 MG 12 hr tablet    Sig: Take 1 tablet (150 mg total) by mouth daily.    Dispense:  30 tablet    Refill:  0    Vitals Temp: 98 F (36.7 C) BP: 131/84 Pulse Rate: 94 SpO2: 100 %   Anthropometric Measurements Height: 4' 10 (1.473 m) Weight: 176 lb (79.8 kg) BMI (Calculated): 36.79 Weight at Last Visit: 175lb Weight Lost Since Last Visit: 0lb Weight Gained Since Last Visit: 1lb Starting Weight: 225lb Total Weight Loss (lbs): 49 lb (22.2 kg) Peak Weight: 232lb   Body Composition  Body Fat %: 43.2 % Fat Mass (lbs): 76.2 lbs Muscle Mass (lbs): 95.2 lbs Total Body Water (lbs): 68 lbs Visceral Fat Rating : 12   Other Clinical Data Fasting: No Labs: No Today's Visit #: 50 Starting Date: 06/12/20     ASSESSMENT AND PLAN:  Diet: Anandi is currently in the action stage of change. As such, her goal is to continue with weight loss efforts. She has agreed to Category 1 Plan.  Exercise: Keirstyn has been instructed to work up to a goal of 150 minutes of combined cardio and strengthening exercise per week for weight loss and overall health benefits.   Behavior Modification:  We discussed the following Behavioral Modification Strategies today: increasing lean protein  intake, decreasing simple carbohydrates, increasing vegetables, increase H2O intake, increase high fiber foods, no skipping meals, meal planning and cooking strategies, emotional eating strategies , avoiding temptations, and planning for success. We discussed various medication options to help Aariel with her weight loss efforts and we both agreed to continue current treatment plan and discuss increase of Zepbound  with [facility] provider.  Return in about 4 weeks (around 08/10/2023).SABRA She was informed of the importance of frequent follow up visits to maximize her success with intensive lifestyle modifications for her multiple health conditions.  Attestation Statements:   Reviewed by clinician on day of visit: allergies, medications, problem list, medical history, surgical history, family history, social history, and previous encounter notes.   Time spent on visit including pre-visit chart review and  post-visit care and charting was 40 minutes.    Brendan Gruwell, PA-C

## 2023-07-13 ENCOUNTER — Encounter (INDEPENDENT_AMBULATORY_CARE_PROVIDER_SITE_OTHER): Payer: Self-pay | Admitting: Physician Assistant

## 2023-07-13 ENCOUNTER — Ambulatory Visit (INDEPENDENT_AMBULATORY_CARE_PROVIDER_SITE_OTHER): Admitting: Physician Assistant

## 2023-07-13 VITALS — BP 131/84 | HR 94 | Temp 98.0°F | Ht <= 58 in | Wt 176.0 lb

## 2023-07-13 DIAGNOSIS — E559 Vitamin D deficiency, unspecified: Secondary | ICD-10-CM | POA: Diagnosis not present

## 2023-07-13 DIAGNOSIS — Z6836 Body mass index (BMI) 36.0-36.9, adult: Secondary | ICD-10-CM

## 2023-07-13 DIAGNOSIS — I10 Essential (primary) hypertension: Secondary | ICD-10-CM

## 2023-07-13 DIAGNOSIS — R7303 Prediabetes: Secondary | ICD-10-CM | POA: Diagnosis not present

## 2023-07-13 DIAGNOSIS — G44209 Tension-type headache, unspecified, not intractable: Secondary | ICD-10-CM

## 2023-07-13 DIAGNOSIS — R632 Polyphagia: Secondary | ICD-10-CM

## 2023-07-13 DIAGNOSIS — F439 Reaction to severe stress, unspecified: Secondary | ICD-10-CM

## 2023-07-13 MED ORDER — VITAMIN D (ERGOCALCIFEROL) 1.25 MG (50000 UNIT) PO CAPS: 50000.0000 [IU] | ORAL_CAPSULE | ORAL | 0 refills | Status: DC

## 2023-07-13 MED ORDER — BUPROPION HCL ER (SR) 150 MG PO TB12: 150.0000 mg | ORAL_TABLET | Freq: Every day | ORAL | 0 refills | Status: DC

## 2023-08-06 ENCOUNTER — Other Ambulatory Visit (INDEPENDENT_AMBULATORY_CARE_PROVIDER_SITE_OTHER): Payer: Self-pay | Admitting: Physician Assistant

## 2023-08-06 DIAGNOSIS — F439 Reaction to severe stress, unspecified: Secondary | ICD-10-CM

## 2023-08-07 NOTE — Progress Notes (Unsigned)
 SUBJECTIVE: Discussed the use of AI scribe software for clinical note transcription with the patient, who gave verbal consent to proceed.  Chief Complaint: Obesity  Interim History: She has maintained her weight since last visit.  Down 49 lbs overall TBW loss of 22.3%  Muscle mass + 2.4 lbs Adipose mass - 3.0 lbs  Leshawn is here to discuss her progress with her obesity treatment plan. She is on the Category 1 Plan and states she is following her eating plan approximately 60 % of the time. She states she is exercising 20 minutes 4 times per week.  JAMIAH HOMEYER is a 58 year old female with obesity, prediabetes, and hypertension who presents for follow-up of her obesity treatment plan.  She has been adhering to an obesity treatment plan with no change in overall weight but has experienced an increase in muscle mass by 2.4 pounds and a decrease in adipose mass by 3 pounds. Her body adipose percentage has decreased from 51.4% to 41.6%. She engages in regular physical activity, including walking and weight training, and focuses on protein intake. She follows her plan about 60% of the time and is working on increasing her water intake.  She is currently taking 15 mg of Zetban for the past three months, experiencing occasional itching, which is alleviated by using [place] soap. She also takes bupropion  and vitamin D  supplements without any issues. For constipation, she uses Coly, usually one or two daily, and is attempting to increase her water intake to aid with this.  Her dietary habits include being mindful of her intake, avoiding snacks, and choosing healthier options such as fruits, hummus, and popcorn. She limits her consumption of ice cream and sodas, opting for smaller portions to prevent overeating.  Socially, she works from home, which she prefers, and is in the process of organizing her home and office space. OBJECTIVE: Visit Diagnoses: Problem List Items Addressed This Visit      Essential hypertension   Prediabetes   Vitamin D  deficiency   Relevant Medications   Vitamin D , Ergocalciferol , (DRISDOL ) 1.25 MG (50000 UNIT) CAPS capsule   Stress   Relevant Medications   buPROPion  (WELLBUTRIN  SR) 150 MG 12 hr tablet   Morbid obesity (HCC)-START BMI 47.03   Polyphagia - Primary   Other Visit Diagnoses       BMI 36.0-36.9,adult Current BMI 36.8          Obesity with polyphagia She is on a weight management plan with Zepbound  15 mg weekly, engaging in physical activities like walking and weight training. This has resulted in a gain of 2.4 pounds of muscle mass and a reduction of 3 pounds of adipose tissue. Her body adipose percentage has decreased from 51.4% to 41.6% overall, with a target of 35% or less. She adheres to her plan about 60% of the time, focusing on protein intake and reducing dining out, and opts for healthier snacks like fruits and hummus. She experiences itching with the medication, managed by using Ivory soap as using other soaps has caused increased itching. She is doing much better since switching to ivory soap and continues to monitor this closely. She has no systemic symptoms of allergic reaction like rash, SOB or other findings.  - Continue Zepbound  15 mg weekly per [employer] [facility] providers - Encourage continued physical activity including walking and weight training - Monitor body composition and adipose percentage - Reinforce dietary modifications focusing on protein intake and healthy snacks - Manage localized injection site itching with continued  use of Ivory soap/avoidance of other soaps which have caused itching at the injection sites.    Prediabetes Last A1c was 5.5- at goal/ Insulin  17.2- slightly increased and not at goal  Medication(s): Zepbound  15 mg SQ weekly Polyphagia:No Denies mass in neck, dysphagia, dyspepsia, persistent hoarseness, abdominal pain, or N/V/Constipation or diarrhea. Has annual eye exam. Mood is stable.   Lab Results   Component Value Date   HGBA1C 5.5 02/24/2023   HGBA1C 5.7 11/22/2022   HGBA1C 5.5 08/05/2022   HGBA1C 5.8 05/26/2022   HGBA1C 5.9 (H) 12/14/2021   Lab Results  Component Value Date   INSULIN  17.2 02/24/2023   INSULIN  11.4 12/14/2021   INSULIN  16.5 06/18/2021   INSULIN  13.9 03/05/2021   INSULIN  13.2 06/12/2020    Plan: Continue Zepbound  15 mg SQ weekly per [employer] [facility] provider Continue working on nutrition plan to decrease simple carbohydrates, increase lean proteins and exercise to promote weight loss, improve glycemic control and prevent progression to Type 2 diabetes.   Hypertension Hypertension asymptomatic, no significant medication side effects noted, and borderline controlled.  Medication(s): losartan 100 mg daily   Amlodipine 10 mg daily  BP Readings from Last 3 Encounters:  08/08/23 130/87  07/13/23 131/84  06/13/23 114/79   Lab Results  Component Value Date   CREATININE 1.00 02/24/2023   CREATININE 1.0 11/22/2022   CREATININE 1.0 08/05/2022   No results found for: GFR  Plan: Continue all antihypertensives at current dosages. Continue to work on nutrition plan to promote weight loss and improve BP control.  Continue monitoring on Zepbound  with continued weight loss. No symptoms of hypotension.   Vitamin D  deficiency She is on vitamin D  supplementation- Ergocalciferol  50,000 units every 14 days.  without issues such as nausea, vomiting, or muscle weakness. Last vitamin D  Lab Results  Component Value Date   VD25OH 77.8 02/24/2023   Low vitamin D  levels can be associated with adiposity and may result in leptin resistance and weight gain. Also associated with fatigue.  Currently on vitamin D  supplementation without any adverse effects such as nausea, vomiting or muscle weakness.  - Continue/refill Ergocalciferol  50,000 units every 14 days.  Follow up labs over the next 1-2 months thru [facility].   Eating disorder/emotional eating/stress Keenya has had issues with  stress/emotional eating. Currently this is well controlled. Overall mood is stable. Medication(s): Bupropion  SR 150 mg daily in am  Plan: Continue and refill Bupropion  SR 150 mg daily in am   Vitals Temp: 98.1 F (36.7 C) BP: 130/87 Pulse Rate: 81 SpO2: 99 %   Anthropometric Measurements Height: 4' 10 (1.473 m) Weight: 176 lb (79.8 kg) BMI (Calculated): 36.79 Weight at Last Visit: 176 lb Weight Lost Since Last Visit: 0 Weight Gained Since Last Visit: 0 Starting Weight: 225 lb Total Weight Loss (lbs): 49 lb (22.2 kg) Peak Weight: 232 lb   Body Composition  Body Fat %: 41.6 % Fat Mass (lbs): 73.2 lbs Muscle Mass (lbs): 97.6 lbs Total Body Water (lbs): 70 lbs Visceral Fat Rating : 12   No data recorded   ASSESSMENT AND PLAN:  Diet: Avo is currently in the action stage of change. As such, her goal is to continue with weight loss efforts. She has agreed to Category 2 Plan.  Exercise: Josselin has been instructed to work up to a goal of 150 minutes of combined cardio and strengthening exercise per week for weight loss and overall health benefits.   Behavior Modification:  We discussed the following Behavioral  Modification Strategies today: increasing lean protein intake, decreasing simple carbohydrates, increasing vegetables, increase H2O intake, increase high fiber foods, decreasing eating out, no skipping meals, meal planning and cooking strategies, avoiding temptations, and planning for success. We discussed various medication options to help Emalia with her weight loss efforts and we both agreed to continue current treatment plan, continue to work on nutritional and behavioral strategies to promote weight loss.  .  Return in about 4 weeks (around 09/05/2023).SABRA She was informed of the importance of frequent follow up visits to maximize her success with intensive lifestyle modifications for her multiple health conditions.  Attestation Statements:   Reviewed by  clinician on day of visit: allergies, medications, problem list, medical history, surgical history, family history, social history, and previous encounter notes.   Time spent on visit including pre-visit chart review and post-visit care and charting was 34 minutes.    Cleburn Maiolo, PA-C

## 2023-08-08 ENCOUNTER — Encounter (INDEPENDENT_AMBULATORY_CARE_PROVIDER_SITE_OTHER): Payer: Self-pay | Admitting: Physician Assistant

## 2023-08-08 ENCOUNTER — Ambulatory Visit (INDEPENDENT_AMBULATORY_CARE_PROVIDER_SITE_OTHER): Admitting: Physician Assistant

## 2023-08-08 VITALS — BP 130/87 | HR 81 | Temp 98.1°F | Ht <= 58 in | Wt 176.0 lb

## 2023-08-08 DIAGNOSIS — R632 Polyphagia: Secondary | ICD-10-CM

## 2023-08-08 DIAGNOSIS — R7303 Prediabetes: Secondary | ICD-10-CM

## 2023-08-08 DIAGNOSIS — I10 Essential (primary) hypertension: Secondary | ICD-10-CM

## 2023-08-08 DIAGNOSIS — F439 Reaction to severe stress, unspecified: Secondary | ICD-10-CM

## 2023-08-08 DIAGNOSIS — Z6836 Body mass index (BMI) 36.0-36.9, adult: Secondary | ICD-10-CM

## 2023-08-08 DIAGNOSIS — E559 Vitamin D deficiency, unspecified: Secondary | ICD-10-CM | POA: Diagnosis not present

## 2023-08-08 MED ORDER — VITAMIN D (ERGOCALCIFEROL) 1.25 MG (50000 UNIT) PO CAPS: 50000.0000 [IU] | ORAL_CAPSULE | ORAL | 0 refills | Status: DC

## 2023-08-08 MED ORDER — BUPROPION HCL ER (SR) 150 MG PO TB12: 150.0000 mg | ORAL_TABLET | Freq: Every day | ORAL | 0 refills | Status: DC

## 2023-08-09 ENCOUNTER — Other Ambulatory Visit (INDEPENDENT_AMBULATORY_CARE_PROVIDER_SITE_OTHER): Payer: Self-pay | Admitting: Physician Assistant

## 2023-08-09 DIAGNOSIS — E559 Vitamin D deficiency, unspecified: Secondary | ICD-10-CM

## 2023-09-04 NOTE — Progress Notes (Unsigned)
 SUBJECTIVE: Discussed the use of AI scribe software for clinical note transcription with the patient, who gave verbal consent to proceed.  Chief Complaint: Obesity  Interim History: She is down 4 lbs since her last visit Down 53 lbs overall TBW loss of 23.6% !!!!  Mary Odonnell is here to discuss her progress with her obesity treatment plan. She is on the Category 1 Plan and states she is following her eating plan approximately 65 % of the time. She states she is exercising walking/strength training 20 minutes 3-4 times per week. Mary Odonnell is a 58 year old female who presents for follow-up of her obesity treatment plan.  She has lost 4 pounds since her last visit and is currently on tirzepatide  15 mg once weekly. She adheres to a category one plan approximately 65% of the time and exercises for 20 minutes, including strength training, three to four times per week. She feels good overall, is not skipping meals, and sleeps 7 to 9 hours per night. She has experienced a significant weight loss of 53 pounds and reports that her body adipose percentage has decreased since starting her weight loss journey. Her goal weight is 150 pounds, though she is uncertain if she wants to go lower.  She has a history of hypertension, vitamin D  deficiency, and insulin  resistance/prediabetes. Her blood pressure was unexpectedly low at 110/75 mmHg today, which she found surprising given recent stressors. She is currently taking Wellbutrin  and vitamin D  supplements, with no issues such as nausea, vomiting, or muscle weakness.  She has experienced some itchiness, which she attributes to experimenting with different soaps, and has taken medication for it once. She has not needed to take it daily.  Socially, she mentions a recent vacation with her family, despite her husband's job loss, which she is viewing as an opportunity for new beginnings. She is planning a trip and is involved in work activities to plan for the  future. She is also adjusting to her children becoming more independent.  OBJECTIVE: Visit Diagnoses: Problem List Items Addressed This Visit     Essential hypertension   Prediabetes   Vitamin D  deficiency   Relevant Medications   Vitamin D , Ergocalciferol , (DRISDOL ) 1.25 MG (50000 UNIT) CAPS capsule   Stress   Relevant Medications   buPROPion  (WELLBUTRIN  SR) 150 MG 12 hr tablet   Morbid obesity (HCC)-START BMI 47.03   Relevant Medications   ZEPBOUND  15 MG/0.5ML Pen   Polyphagia - Primary   Other Visit Diagnoses       BMI 36.0-36.9,adult Current BMI 36.1          Obesity with polyphagia Obesity management is ongoing with a current weight loss of 53 pounds, which is 23.6% of her body weight. She is on tirzepatide  15 mg once weekly and following a Weight Watchers plan with 65% compliance. She exercises 3-4 times per week, including strength training, and is meeting her protein needs. She reports feeling good overall. The focus is on maintaining muscle mass to support metabolism and continued weight loss. Discussion included the potential introduction of Wegovy  in pill form, which is in final trials for FDA approval, as a future option for weight management. - Continue tirzepatide  15 mg once weekly per [employer] provider - Encourage continued adherence to current nutrition Cat 2 plan - Recommend strength training to maintain muscle mass and support metabolism - Suggest use of weighted vest or ankle weights during walking for added resistance - Encourage continued exercise routine, including strength training 3-4  times per week  Hypertension Blood pressure is well-controlled at 110/75 mmHg.  On Norvasc 10 mg daily Losartan 100 mg daily BP Readings from Last 3 Encounters:  09/05/23 110/75  08/08/23 130/87  07/13/23 131/84   Lab Results  Component Value Date   NA 143 02/24/2023   CL 105 02/24/2023   K 4.4 02/24/2023   CO2 24 02/24/2023   BUN 11 02/24/2023   CREATININE 1.00  02/24/2023   EGFR 66 02/24/2023   CALCIUM 9.1 02/24/2023   ALBUMIN 4.3 02/24/2023   GLUCOSE 78 02/24/2023   She reports feeling good and has not experienced any stress-related increases in blood pressure. Continue to work on nutrition plan to promote weight loss and improve BP control.  May be able to consider decreasing her BP medications with continued weight loss/treatment with Zepbound .   Vitamin D  deficiency Vitamin D  supplementation is ongoing without any reported side effects such as nausea, vomiting, or muscle weakness. Last vitamin D  Lab Results  Component Value Date   VD25OH 77.8 02/24/2023   On Ergocalciferol  50,000 units every 14 days.  - Refill vitamin D  prescription at [facility] on [facility] Meds ordered this encounter  Medications   buPROPion  (WELLBUTRIN  SR) 150 MG 12 hr tablet    Sig: Take 1 tablet (150 mg total) by mouth daily.    Dispense:  30 tablet    Refill:  0   Vitamin D , Ergocalciferol , (DRISDOL ) 1.25 MG (50000 UNIT) CAPS capsule    Sig: Take 1 capsule (50,000 Units total) by mouth every 14 (fourteen) days.    Dispense:  2 capsule    Refill:  0    Insulin  resistance/prediabetes Insulin  resistance is being managed as part of her obesity treatment plan. Weight loss and dietary compliance are contributing to improved metabolic health. On Zepbound  15 mg weekly thru [facility] provider.  Denies mass in neck, dysphagia, dyspepsia, persistent hoarseness, abdominal pain, or N/V/Constipation or diarrhea. Has annual eye exam. Mood is stable.  Lab Results  Component Value Date   HGBA1C 5.5 02/24/2023   HGBA1C 5.7 11/22/2022   HGBA1C 5.5 08/05/2022   Lab Results  Component Value Date   LDLCALC 78 02/24/2023   CREATININE 1.00 02/24/2023   INSULIN   Date Value Ref Range Status  02/24/2023 17.2 2.6 - 24.9 uIU/mL Final  ]Continue working on nutrition plan to decrease simple carbohydrates, increase lean proteins and exercise to promote weight loss, improve glycemic  control and prevent progression to Type 2 diabetes.  Continue Zepbound  15 mg weekly per [facility] provider.    Emotional eating/stress Stress with emotional eating is being managed with Wellbutrin , which she reports is helping to keep her mood stable. No issues with the medication were reported. - Refill Wellbutrin  prescription Meds ordered this encounter  Medications   buPROPion  (WELLBUTRIN  SR) 150 MG 12 hr tablet    Sig: Take 1 tablet (150 mg total) by mouth daily.    Dispense:  30 tablet    Refill:  0   Vitamin D , Ergocalciferol , (DRISDOL ) 1.25 MG (50000 UNIT) CAPS capsule    Sig: Take 1 capsule (50,000 Units total) by mouth every 14 (fourteen) days.    Dispense:  2 capsule    Refill:  0    Vitals Temp: 98 F (36.7 C) BP: 110/75 Pulse Rate: 75 SpO2: 100 %   Anthropometric Measurements Height: 4' 10 (1.473 m) Weight: 172 lb (78 kg) BMI (Calculated): 35.96 Weight at Last Visit: 176 lb Weight Lost Since Last Visit: 4 lb  Weight Gained Since Last Visit: 0 Starting Weight: 225 lb Total Weight Loss (lbs): 53 lb (24 kg)   Body Composition  Body Fat %: 41 % Fat Mass (lbs): 70.8 lbs Muscle Mass (lbs): 96.8 lbs Total Body Water (lbs): 68 lbs Visceral Fat Rating : 12   Other Clinical Data Fasting: Yes Today's Visit #: 51 Starting Date: 06/12/20     ASSESSMENT AND PLAN:  Diet: Braylei is currently in the action stage of change. As such, her goal is to continue with weight loss efforts. She has agreed to Category 1 Plan.  Exercise: Milo has been instructed to work up to a goal of 150 minutes of combined cardio and strengthening exercise per week for weight loss and overall health benefits.   Behavior Modification:  We discussed the following Behavioral Modification Strategies today: increasing lean protein intake, decreasing simple carbohydrates, increasing vegetables, increase H2O intake, increase high fiber foods, no skipping meals, meal planning and cooking  strategies, emotional eating strategies , avoiding temptations, and planning for success. We discussed various medication options to help Makaylen with her weight loss efforts and we both agreed to continue current treatment plan.  Return in about 4 weeks (around 10/03/2023).SABRA She was informed of the importance of frequent follow up visits to maximize her success with intensive lifestyle modifications for her multiple health conditions.  Attestation Statements:   Reviewed by clinician on day of visit: allergies, medications, problem list, medical history, surgical history, family history, social history, and previous encounter notes.   Time spent on visit including pre-visit chart review and post-visit care and charting was 31 minutes.    Hermenegildo Clausen, PA-C

## 2023-09-05 ENCOUNTER — Encounter (INDEPENDENT_AMBULATORY_CARE_PROVIDER_SITE_OTHER): Payer: Self-pay | Admitting: Physician Assistant

## 2023-09-05 ENCOUNTER — Ambulatory Visit (INDEPENDENT_AMBULATORY_CARE_PROVIDER_SITE_OTHER): Admitting: Physician Assistant

## 2023-09-05 VITALS — BP 110/75 | HR 75 | Temp 98.0°F | Ht <= 58 in | Wt 172.0 lb

## 2023-09-05 DIAGNOSIS — R7303 Prediabetes: Secondary | ICD-10-CM

## 2023-09-05 DIAGNOSIS — Z6836 Body mass index (BMI) 36.0-36.9, adult: Secondary | ICD-10-CM

## 2023-09-05 DIAGNOSIS — I10 Essential (primary) hypertension: Secondary | ICD-10-CM | POA: Diagnosis not present

## 2023-09-05 DIAGNOSIS — R632 Polyphagia: Secondary | ICD-10-CM

## 2023-09-05 DIAGNOSIS — E559 Vitamin D deficiency, unspecified: Secondary | ICD-10-CM

## 2023-09-05 DIAGNOSIS — F439 Reaction to severe stress, unspecified: Secondary | ICD-10-CM

## 2023-09-05 MED ORDER — VITAMIN D (ERGOCALCIFEROL) 1.25 MG (50000 UNIT) PO CAPS
50000.0000 [IU] | ORAL_CAPSULE | ORAL | 0 refills | Status: DC
Start: 2023-09-05 — End: 2023-10-03

## 2023-09-05 MED ORDER — BUPROPION HCL ER (SR) 150 MG PO TB12: 150.0000 mg | ORAL_TABLET | Freq: Every day | ORAL | 0 refills | Status: DC

## 2023-09-06 LAB — LIPID PANEL
Cholesterol: 153 (ref 0–200)
HDL: 60 (ref 35–70)
LDL Cholesterol: 82
Triglycerides: 55 (ref 40–160)

## 2023-09-06 LAB — HEMOGLOBIN A1C: Hemoglobin A1C: 5.5

## 2023-09-06 LAB — BASIC METABOLIC PANEL WITH GFR: Glucose: 79

## 2023-10-02 NOTE — Progress Notes (Unsigned)
 SUBJECTIVE: Discussed the use of AI scribe software for clinical note transcription with the patient, who gave verbal consent to proceed.  Chief Complaint: Obesity  Interim History: She is up 2 lbs since her last visit.  Down 51 lbs overall TBW loss of 22.7 %  Mary Odonnell is here to discuss her progress with her obesity treatment plan. She is on the Category 1 Plan and states she is following her eating plan approximately 45 % of the time. She states she is exercising walking/weights 20 minutes 3-4 times per week.  Mary Odonnell is a 58 year old female who presents for follow-up of her obesity treatment plan.  She is adhering to her obesity treatment plan approximately 45% of the time and has been on Zepbound  for a year and a half, resulting in a total weight loss of 51 pounds, although she has gained two pounds since her last visit. She engages in physical activity, including walking and weight training for 20 minutes, three to four times per week. Her waist circumference has decreased from 45 to 34 inches. Her total cholesterol has decreased from 179 to 153, triglycerides are at 55, HDL is 60, and LDL has decreased from 98. Her blood pressure readings have changed from 139/98 to 110/75. Her A1c is at 5.5, down from a previous 5.8.  She reports until last week, her progress was steady. She celebrated her birthday and has been focusing on increasing physical activity and cooking more at home.   She is experiencing stress related to her husband's mother's health, who is in hospice care, which has been emotionally challenging. She also faces stress from managing family dynamics.  She is currently taking vitamin D  supplements and needs to improve her water intake. She has been steaming vegetables more frequently.  She is aware of the importance of taking time for herself to de-stress and manage her responsibilities and takes a walk when she needs to take a break from things.  Labs ordered today and  outside labs from biometrics were also reviewed today.  The patient was informed we would discuss the lab results at the next visit unless there is a critical issue that needs to be addressed sooner. The patient agreed to keep the next visit at the agreed upon time to discuss these results.   OBJECTIVE: Visit Diagnoses: Problem List Items Addressed This Visit     Essential hypertension   Prediabetes   Relevant Orders   CMP14+EGFR   Insulin , random   Other fatigue   Relevant Orders   Vitamin B12   Vitamin D  deficiency   Relevant Medications   Vitamin D , Ergocalciferol , (DRISDOL ) 1.25 MG (50000 UNIT) CAPS capsule   Other Relevant Orders   VITAMIN D  25 Hydroxy (Vit-D Deficiency, Fractures)   Stress   Relevant Medications   buPROPion  (WELLBUTRIN  SR) 150 MG 12 hr tablet   Morbid obesity (HCC)-START BMI 47.03   Polyphagia - Primary  Obesity with polyphagia Mary Odonnell has gained 2 pounds since her last visit, despite following a category one plan approximately 45% of the time. She has been on Zepbound  for ~1.5 years, resulting in a 51-pound weight loss. Her cholesterol levels have improved, reducing cardiovascular risk. She engages in physical activity, including walking and weight training, 3-4 times per week. Polyphagia well controlled overall on Zepbound .  - Encourage adherence to the category one plan - Continue Zepbound  15 mg weekly per [facility] provider as required.  - Encourage strength training to increase lean mass -  Monitor weight and body adipose percentage  Polyphagia and emotional eating Polyphagia and emotional eating are managed as part of the obesity treatment plan. Stress and emotional factors, including family issues, may contribute to eating behaviors.  On Wellbutrin  SR but not always taking consistently and feels she does need to take daily at this point.  - Refill and continue on Wellbutrin  SR 150 mg daily - Address stress management strategies  Stress Mary Odonnell  experiences stress due to family issues, including her mother-in-law's health and family dynamics, impacting her eating habits and weight management. On Wellbutrin  SR 150 mg daily, but not always consistent with taking and would like to take consistently and see if this helps with stress.  -Continue/refill and take consistently Wellbutrin  SR 150 mg daily - Encourage stress management techniques - Consider increasing physical activity as a stress outlet Meds ordered this encounter  Medications   buPROPion  (WELLBUTRIN  SR) 150 MG 12 hr tablet    Sig: Take 1 tablet (150 mg total) by mouth daily.    Dispense:  30 tablet    Refill:  0   Vitamin D , Ergocalciferol , (DRISDOL ) 1.25 MG (50000 UNIT) CAPS capsule    Sig: Take 1 capsule (50,000 Units total) by mouth every 14 (fourteen) days.    Dispense:  2 capsule    Refill:  0    Essential hypertension Blood pressure has improved significantly, with systolic reduced from 139 to 110 and diastolic from 98 to 75, likely due to weight loss and medication effects on biometric testing she brings in today.  BP Readings from Last 3 Encounters:  10/03/23 129/85  09/05/23 110/75  08/08/23 130/87  On losartan 100 mg daily Amlodipine 10 mg daily No SE with medications.  Lab Results  Component Value Date   NA 143 02/24/2023   CL 105 02/24/2023   K 4.4 02/24/2023   CO2 24 02/24/2023   BUN 11 02/24/2023   CREATININE 1.00 02/24/2023   EGFR 66 02/24/2023   CALCIUM 9.1 02/24/2023   ALBUMIN 4.3 02/24/2023   GLUCOSE 78 02/24/2023   Plan:  Continue medications Recheck CMET today. Continue to work on nutrition plan to promote weight loss and improve BP control.   Prediabetes Last A1c was 5.5- at goal/ insulin  17.2 not at goal.   Medication(s): Zepbound  15 mg SQ weekly Denies mass in neck, dysphagia, dyspepsia, persistent hoarseness, abdominal pain, or N/V/Constipation or diarrhea. Has annual eye exam. Mood is stable.  Some local reaction to injection  at injection site at times, but less so in recent months with change in soap/lotion.  Polyphagia:No Lab Results  Component Value Date   HGBA1C 5.5 02/24/2023   HGBA1C 5.7 11/22/2022   HGBA1C 5.5 08/05/2022   HGBA1C 5.8 05/26/2022   HGBA1C 5.9 (H) 12/14/2021   Lab Results  Component Value Date   INSULIN  17.2 02/24/2023   INSULIN  11.4 12/14/2021   INSULIN  16.5 06/18/2021   INSULIN  13.9 03/05/2021   INSULIN  13.2 06/12/2020    Plan: Continue Zepbound  15 mg SQ weekly ordered by [employer] provider Continue working on nutrition plan to decrease simple carbohydrates, increase lean proteins and exercise to promote weight loss, improve glycemic control and prevent progression to Type 2 diabetes.     Vitamin D  Deficiency Vitamin D  is at goal of 50.  Most recent vitamin D  level was 77.8. She is on  prescription ergocalciferol  50,000 IU every 14 days. No N/V or muscle weakness with Ergocalciferol   Lab Results  Component Value Date   VD25OH  77.8 02/24/2023   VD25OH 46.9 07/01/2022   VD25OH 47.3 12/14/2021    Plan: Continue and refill  prescription ergocalciferol  50,000 IU every 14 days Low vitamin D  levels can be associated with adiposity and may result in leptin resistance and weight gain. Also associated with fatigue.  Currently on vitamin D  supplementation without any adverse effects such as nausea, vomiting or muscle weakness.  - Refill vitamin D  prescription - Order vitamin D  level Meds ordered this encounter  Medications   buPROPion  (WELLBUTRIN  SR) 150 MG 12 hr tablet    Sig: Take 1 tablet (150 mg total) by mouth daily.    Dispense:  30 tablet    Refill:  0   Vitamin D , Ergocalciferol , (DRISDOL ) 1.25 MG (50000 UNIT) CAPS capsule    Sig: Take 1 capsule (50,000 Units total) by mouth every 14 (fourteen) days.    Dispense:  2 capsule    Refill:  0    Vitals Temp: 98.4 F (36.9 C) BP: 129/85 Pulse Rate: 79 SpO2: 99 %   Anthropometric Measurements Height: 4' 10 (1.473  m) Weight: 174 lb (78.9 kg) BMI (Calculated): 36.38 Weight at Last Visit: 172 lb Weight Lost Since Last Visit: 0 Weight Gained Since Last Visit: 2 lb Starting Weight: 225 lb Total Weight Loss (lbs): 51 lb (23.1 kg) Peak Weight: 232 lb   Body Composition  Body Fat %: 42.7 % Fat Mass (lbs): 74.2 lbs Muscle Mass (lbs): 94.8 lbs Total Body Water (lbs): 66.2 lbs Visceral Fat Rating : 12   Other Clinical Data Fasting: No Labs: No Today's Visit #: 52 Starting Date: 06/12/20     ASSESSMENT AND PLAN:  Diet: Mary Odonnell is currently in the action stage of change. As such, her goal is to continue with weight loss efforts. She has agreed to Category 1 Plan.  Exercise: Bernell has been instructed to work up to a goal of 150 minutes of combined cardio and strengthening exercise per week for weight loss and overall health benefits.   Behavior Modification:  We discussed the following Behavioral Modification Strategies today: increasing lean protein intake, decreasing simple carbohydrates, increasing vegetables, increase H2O intake, increase high fiber foods, meal planning and cooking strategies, emotional eating strategies , avoiding temptations, and planning for success. We discussed various medication options to help Mary Odonnell with her weight loss efforts and we both agreed to continue current treatment plan.  Return in about 4 weeks (around 10/31/2023).SABRA She was informed of the importance of frequent follow up visits to maximize her success with intensive lifestyle modifications for her multiple health conditions.  Attestation Statements:   Reviewed by clinician on day of visit: allergies, medications, problem list, medical history, surgical history, family history, social history, and previous encounter notes.   Time spent on visit including pre-visit chart review and post-visit care and charting was 45 minutes.    Joud Pettinato, PA-C

## 2023-10-03 ENCOUNTER — Encounter (INDEPENDENT_AMBULATORY_CARE_PROVIDER_SITE_OTHER): Payer: Self-pay | Admitting: Physician Assistant

## 2023-10-03 ENCOUNTER — Ambulatory Visit (INDEPENDENT_AMBULATORY_CARE_PROVIDER_SITE_OTHER): Admitting: Physician Assistant

## 2023-10-03 VITALS — BP 129/85 | HR 79 | Temp 98.4°F | Ht <= 58 in | Wt 174.0 lb

## 2023-10-03 DIAGNOSIS — E559 Vitamin D deficiency, unspecified: Secondary | ICD-10-CM

## 2023-10-03 DIAGNOSIS — R7303 Prediabetes: Secondary | ICD-10-CM

## 2023-10-03 DIAGNOSIS — R632 Polyphagia: Secondary | ICD-10-CM | POA: Diagnosis not present

## 2023-10-03 DIAGNOSIS — I10 Essential (primary) hypertension: Secondary | ICD-10-CM

## 2023-10-03 DIAGNOSIS — Z6836 Body mass index (BMI) 36.0-36.9, adult: Secondary | ICD-10-CM

## 2023-10-03 DIAGNOSIS — R5383 Other fatigue: Secondary | ICD-10-CM

## 2023-10-03 DIAGNOSIS — F439 Reaction to severe stress, unspecified: Secondary | ICD-10-CM

## 2023-10-03 MED ORDER — BUPROPION HCL ER (SR) 150 MG PO TB12: 150.0000 mg | ORAL_TABLET | Freq: Every day | ORAL | 0 refills | Status: DC

## 2023-10-03 MED ORDER — VITAMIN D (ERGOCALCIFEROL) 1.25 MG (50000 UNIT) PO CAPS: 50000.0000 [IU] | ORAL_CAPSULE | ORAL | 0 refills | Status: DC

## 2023-10-05 ENCOUNTER — Ambulatory Visit (INDEPENDENT_AMBULATORY_CARE_PROVIDER_SITE_OTHER): Payer: Self-pay | Admitting: Physician Assistant

## 2023-10-05 LAB — CMP14+EGFR
ALT: 12 IU/L (ref 0–32)
AST: 13 IU/L (ref 0–40)
Albumin: 3.9 g/dL (ref 3.8–4.9)
Alkaline Phosphatase: 73 IU/L (ref 49–135)
BUN/Creatinine Ratio: 16 (ref 9–23)
BUN: 13 mg/dL (ref 6–24)
Bilirubin Total: 0.6 mg/dL (ref 0.0–1.2)
CO2: 19 mmol/L — ABNORMAL LOW (ref 20–29)
Calcium: 9.1 mg/dL (ref 8.7–10.2)
Chloride: 107 mmol/L — ABNORMAL HIGH (ref 96–106)
Creatinine, Ser: 0.8 mg/dL (ref 0.57–1.00)
Globulin, Total: 2.7 g/dL (ref 1.5–4.5)
Glucose: 76 mg/dL (ref 70–99)
Potassium: 4.6 mmol/L (ref 3.5–5.2)
Sodium: 142 mmol/L (ref 134–144)
Total Protein: 6.6 g/dL (ref 6.0–8.5)
eGFR: 86 mL/min/1.73 (ref >59)

## 2023-10-05 LAB — VITAMIN D 25 HYDROXY (VIT D DEFICIENCY, FRACTURES): Vit D, 25-Hydroxy: 46.3 ng/mL (ref 30.0–100.0)

## 2023-10-05 LAB — INSULIN, RANDOM: INSULIN: 8.1 u[IU]/mL (ref 2.6–24.9)

## 2023-10-05 LAB — VITAMIN B12: Vitamin B-12: 570 pg/mL (ref 232–1245)

## 2023-10-30 NOTE — Progress Notes (Unsigned)
 Office: [phone]  /  Fax: [phone]  Weight Summary And Biometrics  Vitals Temp: 98 F (36.7 C) BP: 112/79 Pulse Rate: 80 SpO2: 100 %   Anthropometric Measurements Height: 4' 10 (1.473 m) Weight: 171 lb (77.6 kg) BMI (Calculated): 35.75 Weight at Last Visit: 174 lb Weight Lost Since Last Visit: 3 lb Weight Gained Since Last Visit: 0 Starting Weight: 225 lb Total Weight Loss (lbs): 54 lb (24.5 kg) Peak Weight: 232 lb   Body Composition  Body Fat %: 41.7 % Fat Mass (lbs): 71.6 lbs Muscle Mass (lbs): 95.2 lbs Total Body Water (lbs): 65.6 lbs Visceral Fat Rating : 12    No data recorded Today's Visit #: 6  Starting Date: 06/12/20   Subjective   Chief Complaint: Obesity  Mary Odonnell is here to discuss her progress with her obesity treatment plan. She is on the {MWMwtlossportion/plan2:23431} and states she is following her eating plan approximately *** % of the time. She states she is exercising *** minutes *** times per week.  Weight Progress Since Last Visit:  Mary Odonnell is a 58 year old female with obesity who presents for follow-up of her obesity treatment plan.  She has been adhering to a weight management plan and is currently on Zepam 15 mg weekly, bupropion  SR 150 mg daily, and ergocalciferol  50,000 units every 14 days for vitamin D  deficiency. She is also taking losartan 100 mg daily and amlodipine 10 mg daily for hypertension. She adheres to her weight management plan approximately 55% of the time and has lost a total of 54 pounds, including a 3-pound loss since her last visit.  Her dietary habits include tracking calories, consuming more whole foods, and ensuring adequate protein intake. However, she is not consistently drinking enough water. She is not skipping meals and sleeps 7 to 9 hours nightly. Her physical activity includes walking 20 minutes four times per week and weight training twice weekly. She has recently started using weighted vests  for additional resistance during walks.  Bupropion  has been beneficial for stress management and maintaining her routine. She has reduced eating out due to her mother-in-law's health condition, which has inadvertently improved her dietary habits. She is not experiencing any issues with her current medications, including no interference with sleep from bupropion .   Since last office visit she has {emweight change:30888}. She reports {EMADHERENCE:28838::good adherence to reduced calorie nutritional plan.}  Reports following nutritional recommendations with {emnutritionhpi:32307} Uses {emtracking:32308} to track intake.  Engages in {emactivity:32309} physical activity.  Reports {emincreasedactivity:32310} .  She has been working on [employer] labels,not skipping meals,increasing protein intake at every meal,drinking more water,making healthier choices,reducing portion sizes,incorporating more whole foods}   Challenges affecting patient progress: {EMOBESITYBARRIERS:28841::none}.   Orexigenic Control: {Actions; denies-reports:32002} problems with appetite and hunger signals.  {Actions; denies-reports:32002} problems with satiety and satiation.  {Actions; denies-reports:32002} problems with eating patterns and portion control.  {Actions; denies-reports:32002} abnormal cravings. {Actions; denies-reports:32002} feeling deprived or restricted.   Pharmacotherapy for weight management: She is currently taking {EMPharmaco:28845}.   Assessment and Plan   Treatment Plan For Obesity:  Recommended Dietary Goals  Mary Odonnell is currently in the action stage of change. As such, her goal is to continue weight management plan. She has agreed to: {EMWTLOSSPLAN:29297::continue current plan}  Behavioral Health and Counseling  We discussed the following behavioral modification strategies today: {EMWMwtlossstrategies:28914::continue to work on maintaining a reduced  calorie state, getting the recommended amount of protein, incorporating whole foods, making healthy choices, staying well hydrated and practicing  mindfulness when eating.,increase protein intake, fibrous foods (25 grams per day for women, 30 grams for men) and water to improve satiety and decrease hunger signals. }.  Additional education and resources provided today: {EMadditionalresources:29169::None}  Recommended Physical Activity Goals  Mary Odonnell has been advised to work up to 150 minutes of moderate intensity aerobic activity a week and strengthening exercises 2-3 times per week for cardiovascular health, weight loss maintenance and preservation of muscle mass.   She has agreed to :  {EMEXERCISE:28847::Think about enjoyable ways to increase daily physical activity and overcoming barriers to exercise,Increase physical activity in their day and reduce sedentary time (increase NEAT).,Increase volume of physical activity to a goal of 240 minutes a week,Combine aerobic and strengthening exercises for efficiency and improved cardiometabolic health.}  Pharmacotherapy  We discussed various medication options to help Mary Odonnell with her weight loss efforts and we both agreed to : {EMagreedrx:29170}  Associated Conditions Impacted by Obesity Treatment  1. Polyphagia ***  2. Prediabetes ***  3. Essential hypertension ***  4. Vitamin D  deficiency ***  5. Obesity (HCC)-START BMI 47.03 ***  Morbid obesity due to excess calories Morbid obesity with polyphagia, currently on a weight management plan with Weight [employer] and Zepam 15 mg weekly. She has lost 54 pounds in total, with a recent loss of 3 pounds since the last visit. Engaging in regular physical activity, including walking and weight training. Tracking calories, eating whole foods, and maintaining adequate protein intake. Not consistently drinking enough water. No issues with skipping meals or sleep disturbances. Bupropion  SR 150  mg daily is aiding in stress management. - Continue Zepam 15 mg weekly - Continue bupropion  SR 150 mg daily - Encourage increased water intake - Continue current exercise regimen - Continue tracking calories and maintaining whole food diet  Prediabetes Prediabetes. Recent A1c was 5.5, indicating good control. Insulin  levels have decreased, suggesting adherence to dietary plan and reduced intake of simple carbohydrates.  Essential hypertension Hypertension well-controlled with current medication regimen. Blood pressure readings are stable. - Continue losartan 100 mg daily - Continue amlodipine 10 mg daily  Hyperlipidemia No specific discussion regarding hyperlipidemia management in this visit.  Vitamin D  deficiency Vitamin D  deficiency managed with ergocalciferol  50,000 units every 14 days. No reported issues with current supplementation. - Continue ergocalciferol  50,000 units every 14 days  Objective   Physical Exam:  Blood pressure 112/79, pulse 80, temperature 98 F (36.7 C), height 4' 10 (1.473 m), weight 171 lb (77.6 kg), last menstrual period 01/24/2016, SpO2 100%. Body mass index is 35.74 kg/m.  General: She is overweight, cooperative, alert, well developed, and in no acute distress. PSYCH: Has normal mood, affect and thought process.   HEENT: EOMI, sclerae are anicteric. Lungs: Normal breathing effort, no conversational dyspnea. Extremities: No edema.  Neurologic: No gross sensory or motor deficits. No tremors or fasciculations noted.    Diagnostic Data Reviewed:  BMET    Component Value Date/Time   NA 142 10/03/2023 1003   K 4.6 10/03/2023 1003   CL 107 (H) 10/03/2023 1003   CO2 19 (L) 10/03/2023 1003   GLUCOSE 76 10/03/2023 1003   GLUCOSE 96 03/30/2016 1041   BUN 13 10/03/2023 1003   CREATININE 0.80 10/03/2023 1003   CALCIUM 9.1 10/03/2023 1003   GFRNONAA 96 08/21/2018 0931   GFRAA 110 08/21/2018 0931   Lab Results  Component Value Date   HGBA1C 5.5  09/06/2023   HGBA1C 6.1 (H) 10/11/2017   Lab Results  Component Value Date  INSULIN  8.1 10/03/2023   INSULIN  13.2 06/12/2020   Lab Results  Component Value Date   TSH 0.80 12/14/2021   CBC    Component Value Date/Time   WBC 3.2 (L) 02/24/2023 0949   RBC 5.06 02/24/2023 0949   RBC 4.77 11/22/2022 0000   HGB 14.2 02/24/2023 0949   HCT 43.6 02/24/2023 0949   PLT 313 02/24/2023 0949   MCV 86 02/24/2023 0949   MCH 28.1 02/24/2023 0949   MCHC 32.6 02/24/2023 0949   RDW 13.2 02/24/2023 0949   Iron Studies No results found for: IRON, TIBC, FERRITIN, IRONPCTSAT Lipid Panel     Component Value Date/Time   CHOL 153 09/06/2023 0000   CHOL 153 02/24/2023 0949   TRIG 55 09/06/2023 0000   HDL 60 09/06/2023 0000   HDL 63 02/24/2023 0949   CHOLHDL 2.4 06/18/2021 0936   LDLCALC 82 09/06/2023 0000   LDLCALC 78 02/24/2023 0949   Hepatic Function Panel     Component Value Date/Time   PROT 6.6 10/03/2023 1003   ALBUMIN 3.9 10/03/2023 1003   AST 13 10/03/2023 1003   ALT 12 10/03/2023 1003   ALKPHOS 73 10/03/2023 1003   BILITOT 0.6 10/03/2023 1003      Component Value Date/Time   TSH 0.80 12/14/2021 0000   TSH 1.300 06/18/2021 0936   Nutritional Lab Results  Component Value Date   VD25OH 46.3 10/03/2023   VD25OH 77.8 02/24/2023   VD25OH 46.9 07/01/2022    Follow-Up   Return in about 4 weeks (around 11/28/2023).SABRA She was informed of the importance of frequent follow up visits to maximize her success with intensive lifestyle modifications for her multiple health conditions.  Attestation Statement   Reviewed by clinician on day of visit: allergies, medications, problem list, medical history, surgical history, family history, social history, and previous encounter notes.     Mary Wendel,PA-C

## 2023-10-31 ENCOUNTER — Ambulatory Visit (INDEPENDENT_AMBULATORY_CARE_PROVIDER_SITE_OTHER): Admitting: Physician Assistant

## 2023-10-31 ENCOUNTER — Ambulatory Visit: Admitting: Dermatology

## 2023-10-31 ENCOUNTER — Encounter (INDEPENDENT_AMBULATORY_CARE_PROVIDER_SITE_OTHER): Payer: Self-pay | Admitting: Physician Assistant

## 2023-10-31 VITALS — BP 112/79 | HR 80 | Temp 98.0°F | Ht <= 58 in | Wt 171.0 lb

## 2023-10-31 DIAGNOSIS — R7303 Prediabetes: Secondary | ICD-10-CM | POA: Diagnosis not present

## 2023-10-31 DIAGNOSIS — I10 Essential (primary) hypertension: Secondary | ICD-10-CM

## 2023-10-31 DIAGNOSIS — F439 Reaction to severe stress, unspecified: Secondary | ICD-10-CM

## 2023-10-31 DIAGNOSIS — F5089 Other specified eating disorder: Secondary | ICD-10-CM

## 2023-10-31 DIAGNOSIS — R632 Polyphagia: Secondary | ICD-10-CM

## 2023-10-31 DIAGNOSIS — E785 Hyperlipidemia, unspecified: Secondary | ICD-10-CM | POA: Diagnosis not present

## 2023-10-31 DIAGNOSIS — Z6835 Body mass index (BMI) 35.0-35.9, adult: Secondary | ICD-10-CM

## 2023-10-31 DIAGNOSIS — E559 Vitamin D deficiency, unspecified: Secondary | ICD-10-CM

## 2023-10-31 DIAGNOSIS — E669 Obesity, unspecified: Secondary | ICD-10-CM

## 2023-10-31 MED ORDER — BUPROPION HCL ER (SR) 150 MG PO TB12: 150.0000 mg | ORAL_TABLET | Freq: Every day | ORAL | 0 refills | Status: DC

## 2023-10-31 MED ORDER — VITAMIN D (ERGOCALCIFEROL) 1.25 MG (50000 UNIT) PO CAPS: 50000.0000 [IU] | ORAL_CAPSULE | ORAL | 0 refills | Status: DC

## 2023-11-01 ENCOUNTER — Ambulatory Visit: Admitting: Dermatology

## 2023-11-02 ENCOUNTER — Encounter: Payer: Self-pay | Admitting: Dermatology

## 2023-11-02 ENCOUNTER — Ambulatory Visit: Admitting: Dermatology

## 2023-11-02 VITALS — BP 128/99 | HR 87

## 2023-11-02 DIAGNOSIS — L729 Follicular cyst of the skin and subcutaneous tissue, unspecified: Secondary | ICD-10-CM

## 2023-11-02 DIAGNOSIS — L810 Postinflammatory hyperpigmentation: Secondary | ICD-10-CM

## 2023-11-02 DIAGNOSIS — L680 Hirsutism: Secondary | ICD-10-CM

## 2023-11-02 NOTE — Progress Notes (Signed)
   Follow-Up Visit   Subjective  Mary Odonnell is a 58 y.o. female who presents for the following: Post inflammatory hyperpigmentation on the face  Patient was last evaluated on 05/02/23. At this visit patient was advised to continue tranexamic acid and to start Eucerine Radiant Tone. Patient reports sxs are better. Patient denies medication changes.  Patient reports that she has a flare up maybe every two months. Patient reports that the spots do appear to be lighter and has been using medication and products as advised. Patient states that she is not picking at hairs as often as she was before.     The following portions of the chart were reviewed this encounter and updated as appropriate: medications, allergies, medical history  Review of Systems:  No other skin or systemic complaints except as noted in HPI or Assessment and Plan.  Objective  Well appearing patient in no apparent distress; mood and affect are within normal limits.  A focused examination was performed of the following areas: Chin and Neck  Relevant exam findings are noted in the Assessment and Plan.          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 Recommend daily sun protection including sunscreen SPF 30+ to sun-exposed areas. - Recommend treating any itchy or red areas on the skin quickly to prevent new areas of PIH. Once rash has cleared, treating with prescription medicines such as hydroquinone may help fade dark spots faster.    Treatment Plan: - Continue with Tranexamic acid Eucerin Radiant Tone regimen - Recommended using a facial hair trimmer on area - Recommended Eucerin Sensitive Skin Sunscreen  Dermatosis Papulosa  Nigra - Recommended Roc retinol eye cream - Discussed removal (cosmetic)  EPIDERMAL INCLUSION CYST Exam: Subcutaneous nodule at groin  Benign-appearing. Exam most consistent with an epidermal inclusion cyst. Discussed that a cyst is a benign growth that can grow over time and sometimes get irritated or inflamed. Recommend observation if it is not bothersome. Discussed option of surgical excision to remove it if it is growing, symptomatic, or other changes noted. Please call for new or changing lesions so they can be evaluated.  - Recommended water wipes - Advised to be cautious with hair removal in the area - Recommended Gillette Mach 5 razor and Aveeno shaving gel     Return in about 1 year (around 11/01/2024) for PIH F/U.  I, Lyle Cords, as acting as a Neurosurgeon for [employer], DO .   Documentation: I have reviewed the above documentation for accuracy and completeness, and I agree with the above.  Delon Lenis, DO

## 2023-11-02 NOTE — Patient Instructions (Addendum)
 VISIT SUMMARY:  During today's visit, we discussed your ongoing skin pigmentation and hair removal concerns. You reported improvement in your skin pigmentation with the use of prescribed creams and sunscreen. We also addressed your concerns about moles, bumps on your eyelids, an itchy spot on your back, and recurrent folliculitis in the groin area.  YOUR PLAN:  -MELASMA:  Melasma is a condition that causes dark, discolored patches on your skin. Your pigmentation is improving with the current treatment, but complete resolution may take 6-12 months.  Continue using the prescription cream and Eucerin Radiant Tone morning and night, and use the recommended sunscreen.  For future sunny trips, use a mineral sunscreen. A new prescription for Medrock transaminic acid has been provided with 5-6 refills.  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 They are not dangerous but can be removed for cosmetic reasons. A quote for cosmetic removal has been provided. To slow the formation of new DPNs, use the recommended retinol eye cream. A picture of the recommended product has been provided.  -FOLLICULITIS AND RECURRENT CYSTS OF THE VULVA:  Folliculitis is an infection of the hair follicles that can cause red, inflamed bumps. Continue using benzoyl peroxide cleanser once or twice a week. To prevent irritation, use a men's Gillette Mach 5 Fusion razor and Aveeno shaving gel. Avoid dry shaving and use Dove soap if needed.  -NOTALGIA PARESTHETICA:  Notalgia paresthetica is a condition that causes chronic itching on the upper back due to nerve irritation.  Use the provided sample of CeraVe anti-itch cream with pramoxine daily. If this does not help, we may consider systemic treatment with gabapentin.  -BENIGN DERMATOFIBROMA OF THE LEG:  A benign dermatofibroma is a non-cancerous skin growth often caused by minor trauma like a  bug bite or shaving nick. It does not require treatment, but avoid shaving over it to prevent thickening.  INSTRUCTIONS:  Please follow up as needed for any changes or concerns with your skin conditions. Continue with the prescribed treatments and use the recommended products. If you experience any new symptoms or if your current symptoms worsen, schedule an appointment for further evaluation.   Other Procedure(Skin Tags): Quote for cosmetic removal:  $200 to remove Up to 15 lesions $300 to remove  16 to 25 $400 to removal  26-35 $10 per tag for each additional            Important Information  Due to recent changes in healthcare laws, you may see results of your pathology and/or laboratory studies on MyChart before the doctors have had a chance to review them. We understand that in some cases there may be results that are confusing or concerning to you. Please understand that not all results are received at the same time and often the doctors may need to interpret multiple results in order to provide you with the best plan of care or course of treatment. Therefore, we ask that you please give us  2 business days to thoroughly review all your results before contacting the office for clarification. Should we see a critical lab result, you will be contacted sooner.   If You Need Anything After Your Visit  If you have any questions or concerns for your doctor, please call our main line at [phone] If no one answers, please leave a voicemail as directed and we will return your call as soon as possible. Messages left after 4 pm  will be answered the following business day.   You may also send us  a message via MyChart. We typically respond to MyChart messages within 1-2 business days.  For prescription refills, please ask your pharmacy to contact our office. Our fax number is [phone].  If you have an urgent issue when the clinic is closed that cannot wait until the next business  day, you can page your doctor at the number below.    Please note that while we do our best to be available for urgent issues outside of office hours, we are not available 24/7.   If you have an urgent issue and are unable to reach us , you may choose to seek medical care at your doctor's office, retail clinic, urgent care center, or emergency room.  If you have a medical emergency, please immediately call 911 or go to the emergency department. In the event of inclement weather, please call our main line at [phone] for an update on the status of any delays or closures.  Dermatology Medication Tips: Please keep the boxes that topical medications come in in order to help keep track of the instructions about where and how to use these. Pharmacies typically print the medication instructions only on the boxes and not directly on the medication tubes.   If your medication is too expensive, please contact our office at [phone] or send us  a message through MyChart.   We are unable to tell what your co-pay for medications will be in advance as this is different depending on your insurance coverage. However, we may be able to find a substitute medication at lower cost or fill out paperwork to get insurance to cover a needed medication.   If a prior authorization is required to get your medication covered by your insurance company, please allow us  1-2 business days to complete this process.  Drug prices often vary depending on where the prescription is filled and some pharmacies may offer cheaper prices.  The website www.goodrx.com contains coupons for medications through different pharmacies. The prices here do not account for what the cost may be with help from insurance (it may be cheaper with your insurance), but the website can give you the price if you did not use any insurance.  - You can print the associated coupon and take it with your prescription to the pharmacy.  - You may also stop  by our office during regular business hours and pick up a GoodRx coupon card.  - If you need your prescription sent electronically to a different pharmacy, notify our office through [facility] or by phone at [phone]

## 2023-11-27 NOTE — Progress Notes (Unsigned)
 SUBJECTIVE: Discussed the use of AI scribe software for clinical note transcription with the patient, who gave verbal consent to proceed.  Chief Complaint: Obesity  Interim History: She has maintained her weight since her last visit.  Down 54 lbs overall TBW loss of 24%   Mary Odonnell is here to discuss her progress with her obesity treatment plan. She is on the Category 1 Plan and states she is following her eating plan approximately 50 % of the time. She states she is exercising strength and cardio 30 minutes 5 times per week.  Mary Odonnell is a 58 year old female who presents for follow-up of her obesity treatment plan.  She has been on Zepbound  15 mg once weekly through a [facility] [employer] provider and has lost 54 pounds, which is 24% of her body weight. There is a slight decrease in muscle mass by 0.2 pounds and adipose mass by 0.6 pounds since the last visit. She aims to reach a personal goal weight of 150 pounds. She follows her plan about 50% of the time, attributing recent challenges to the passing of her mother-in-law two weeks ago, which has significantly impacted her routine and emotional state.  She has a history of polyphagia, prediabetes, vitamin D  deficiency, and stress with emotional eating. She is on bupropion  SR 150 mg daily, which she finds beneficial for managing emotional eating and stress. She takes losartan 100 mg daily and amlodipine 10 mg daily for blood pressure control. For vitamin D  deficiency, she takes ergocalciferol  50,000 units once weekly.  Her mother-in-law's recent passing has been a significant emotional event, affecting her adherence to her dietary plan. She describes the situation as 'horrible' and notes that her husband has been handling the situation with support from family and friends. She has been engaging in activities such as walking to cope with the stress. OBJECTIVE: Visit Diagnoses: Problem List Items Addressed This Visit     Prediabetes   Vitamin  D deficiency   Relevant Medications   Vitamin D , Ergocalciferol , (DRISDOL ) 1.25 MG (50000 UNIT) CAPS capsule   Stress   Relevant Medications   buPROPion  (WELLBUTRIN  SR) 150 MG 12 hr tablet   Morbid obesity (HCC)-START BMI 47.03   Polyphagia - Primary   Other Visit Diagnoses       BMI 35.0-35.9,adult Current BMI 35.7          Obesity with polyphagia Obesity management is ongoing with Zepbound  15 mg weekly. She has lost 54 pounds, a 24% reduction in body weight, with a personal goal of reaching 150 pounds. Emotional eating and stress are managed with bupropion  SR 150 mg daily, which she finds beneficial. Recent stress due to the passing of her mother-in-law has impacted adherence to her plan, with adherence reported at 50%. Polyphagia moderate to well controlled.  - Continue Zepbound  15 mg weekly. - Continue bupropion  SR 150 mg daily. - Encouraged adherence to dietary plan and physical activity. - Discussed strategies for managing emotional eating and stress.  Prediabetes Last A1c was 5.5  Medication(s): Zepbound  15 mg SQ weekly Denies mass in neck, dysphagia, dyspepsia, persistent hoarseness, abdominal pain, or N/V/Constipation or diarrhea. Has annual eye exam. Mood is stable.   Polyphagia:No Lab Results  Component Value Date   HGBA1C 5.5 09/06/2023   HGBA1C 5.5 02/24/2023   HGBA1C 5.7 11/22/2022   HGBA1C 5.5 08/05/2022   HGBA1C 5.8 05/26/2022   Lab Results  Component Value Date   INSULIN  8.1 10/03/2023   INSULIN  17.2 02/24/2023  INSULIN  11.4 12/14/2021   INSULIN  16.5 06/18/2021   INSULIN  13.9 03/05/2021    Plan: Continue Zepbound  15 mg SQ weekly per [facility] provider.  Continue working on nutrition plan to decrease simple carbohydrates, increase lean proteins and exercise to promote weight loss, improve glycemic control and prevent progression to Type 2 diabetes.   Other depression/emotional eating Mary Odonnell has had issues with stress/emotional eating. Emotional eating and  stress are managed with bupropion  SR 150 mg daily, which she finds beneficial. Recent stress due to the passing of her mother-in-law has impacted adherence to her plan, with adherence reported at 50%.  Currently this is moderately controlled. Overall mood is stable. Medication(s): Bupropion  SR 150 mg daily in am No SE.  She requested information concerning counseling through [facility] today.   Plan: Continue and refill Bupropion  SR 150 mg daily in am Referral to Dr. Sharron but may need to be thru [facility] pending changes in Dr. Roque role.   Hypertension Well-controlled with losartan 100 mg daily and amlodipine 10 mg daily. No SE BP Readings from Last 3 Encounters:  11/28/23 134/89  11/02/23 (!) 128/99  10/31/23 112/79   Continue to work on nutrition plan to promote weight loss and improve BP control.  - Continue losartan 100 mg daily. Thru PCP - Continue amlodipine 10 mg daily. Thru PCP  Vitamin D  deficiency Managed with ergocalciferol  50,000 units every other week. No N/V or muscle weakness with Ergocalciferol .  Last vitamin D  Lab Results  Component Value Date   VD25OH 46.3 10/03/2023    - Continue/refill ergocalciferol  50,000 units every other week.  Low vitamin D  levels can be associated with adiposity and may result in leptin resistance and weight gain. Also associated with fatigue.  Currently on vitamin D  supplementation without any adverse effects such as nausea, vomiting or muscle weakness.   Vitals Temp: 98 F (36.7 C) BP: 134/89 Pulse Rate: 75 SpO2: 99 %   Anthropometric Measurements Height: 4' 10 (1.473 m) Weight: 171 lb (77.6 kg) BMI (Calculated): 35.75 Weight at Last Visit: 171 lb Weight Lost Since Last Visit: 0 lb Weight Gained Since Last Visit: 0 lb Starting Weight: 225 lb Total Weight Loss (lbs): 54 lb (24.5 kg) Peak Weight: 232 lb   Body Composition  Body Fat %: 41.5 % Fat Mass (lbs): 71 lbs Muscle Mass (lbs): 95 lbs Total Body Water (lbs):  65.4 lbs Visceral Fat Rating : 12   Other Clinical Data Fasting: yes Labs: no Today's Visit #: 54 Starting Date: 06/12/20     ASSESSMENT AND PLAN:  Diet: Trust is currently in the action stage of change. As such, her goal is to continue with weight loss efforts. She has agreed to Category 1 Plan.  Exercise: Mary Odonnell has been instructed to continue exercising as is for weight loss and overall health benefits.   Behavior Modification:  We discussed the following Behavioral Modification Strategies today: increasing lean protein intake, decreasing simple carbohydrates, increasing vegetables, increase H2O intake, increase high fiber foods, no skipping meals, meal planning and cooking strategies, better snacking choices, emotional eating strategies , avoiding temptations, and planning for success. We discussed various medication options to help Mary Odonnell with her weight loss efforts and we both agreed to continue Zepbound  15 mg weekly for medical weight loss as well as prediabetes.  Return in about 4 weeks (around 12/26/2023).Mary Odonnell She was informed of the importance of frequent follow up visits to maximize her success with intensive lifestyle modifications for her multiple health conditions.  Attestation  Statements:   Reviewed by clinician on day of visit: allergies, medications, problem list, medical history, surgical history, family history, social history, and previous encounter notes.   Time spent on visit including pre-visit chart review and post-visit care and charting was 41 minutes.    Mary Brisby, PA-C

## 2023-11-28 ENCOUNTER — Encounter (INDEPENDENT_AMBULATORY_CARE_PROVIDER_SITE_OTHER): Payer: Self-pay | Admitting: Physician Assistant

## 2023-11-28 ENCOUNTER — Ambulatory Visit (INDEPENDENT_AMBULATORY_CARE_PROVIDER_SITE_OTHER): Payer: Self-pay | Admitting: Physician Assistant

## 2023-11-28 VITALS — BP 134/89 | HR 75 | Temp 98.0°F | Ht <= 58 in | Wt 171.0 lb

## 2023-11-28 DIAGNOSIS — R632 Polyphagia: Secondary | ICD-10-CM | POA: Diagnosis not present

## 2023-11-28 DIAGNOSIS — R7303 Prediabetes: Secondary | ICD-10-CM

## 2023-11-28 DIAGNOSIS — Z6835 Body mass index (BMI) 35.0-35.9, adult: Secondary | ICD-10-CM

## 2023-11-28 DIAGNOSIS — E559 Vitamin D deficiency, unspecified: Secondary | ICD-10-CM

## 2023-11-28 DIAGNOSIS — F439 Reaction to severe stress, unspecified: Secondary | ICD-10-CM | POA: Diagnosis not present

## 2023-11-28 DIAGNOSIS — F5089 Other specified eating disorder: Secondary | ICD-10-CM

## 2023-11-28 MED ORDER — BUPROPION HCL ER (SR) 150 MG PO TB12: 150.0000 mg | ORAL_TABLET | Freq: Every day | ORAL | 0 refills | Status: DC

## 2023-11-28 MED ORDER — VITAMIN D (ERGOCALCIFEROL) 1.25 MG (50000 UNIT) PO CAPS: 50000.0000 [IU] | ORAL_CAPSULE | ORAL | 0 refills | Status: DC

## 2023-12-25 NOTE — Progress Notes (Unsigned)
 SUBJECTIVE: Discussed the use of AI scribe software for clinical note transcription with the patient, who gave verbal consent to proceed.  Chief Complaint: Obesity  Interim History: She has maintained her weight since her last visit.  Down 54 lbs overall TBW loss of 24%   Mary Odonnell is here to discuss her progress with her obesity treatment plan. She is on the Category 1 Plan and states she is following her eating plan approximately 50 % of the time. She states she is exercising strength and cardio 30 minutes 5 times per week.  Mary Odonnell is a 58 year old female who presents for follow-up of her obesity treatment plan.  She has been on Zepbound  15 mg once weekly through a [facility] provider and has lost 54 pounds, which is 24% of her body weight. There is a slight decrease in muscle mass by 0.2 pounds and adipose mass by 0.6 pounds since the last visit. She aims to reach a personal goal weight of 150 pounds. She follows her plan about 50% of the time, attributing recent challenges to the passing of her mother-in-law two weeks ago, which has significantly impacted her routine and emotional state.  She has a history of polyphagia, prediabetes, vitamin D  deficiency, and stress with emotional eating. She is on bupropion  SR 150 mg daily, which she finds beneficial for managing emotional eating and stress. She takes losartan 100 mg daily and amlodipine 10 mg daily for blood pressure control. For vitamin D  deficiency, she takes ergocalciferol  50,000 units once weekly.  Her mother-in-law's recent passing has been a significant emotional event, affecting her adherence to her dietary plan. She describes the situation as 'horrible' and notes that her husband has been handling the situation with support from family and friends. She has been engaging in activities such as walking to cope with the stress. OBJECTIVE: Visit Diagnoses: Problem List Items Addressed This Visit     Prediabetes   Vitamin  D deficiency   Relevant Medications   Vitamin D , Ergocalciferol , (DRISDOL ) 1.25 MG (50000 UNIT) CAPS capsule   Stress   Relevant Medications   buPROPion  (WELLBUTRIN  SR) 150 MG 12 hr tablet   Morbid obesity (HCC)-START BMI 47.03   Polyphagia - Primary   Other Visit Diagnoses       BMI 35.0-35.9,adult Current BMI 35.7          Obesity with polyphagia Obesity management is ongoing with Zepbound  15 mg weekly. She has lost 54 pounds, a 24% reduction in body weight, with a personal goal of reaching 150 pounds. Emotional eating and stress are managed with bupropion  SR 150 mg daily, which she finds beneficial. Recent stress due to the passing of her mother-in-law has impacted adherence to her plan, with adherence reported at 50%. Polyphagia moderate to well controlled.  - Continue Zepbound  15 mg weekly. - Continue bupropion  SR 150 mg daily. - Encouraged adherence to dietary plan and physical activity. - Discussed strategies for managing emotional eating and stress.  Prediabetes Last A1c was 5.5  Medication(s): Zepbound  15 mg SQ weekly Denies mass in neck, dysphagia, dyspepsia, persistent hoarseness, abdominal pain, or N/V/Constipation or diarrhea. Has annual eye exam. Mood is stable.   Polyphagia:No Lab Results  Component Value Date   HGBA1C 5.5 09/06/2023   HGBA1C 5.5 02/24/2023   HGBA1C 5.7 11/22/2022   HGBA1C 5.5 08/05/2022   HGBA1C 5.8 05/26/2022   Lab Results  Component Value Date   INSULIN  8.1 10/03/2023   INSULIN  17.2 02/24/2023  INSULIN  11.4 12/14/2021   INSULIN  16.5 06/18/2021   INSULIN  13.9 03/05/2021    Plan: Continue Zepbound  15 mg SQ weekly per [facility] provider.  Continue working on nutrition plan to decrease simple carbohydrates, increase lean proteins and exercise to promote weight loss, improve glycemic control and prevent progression to Type 2 diabetes.   Other depression/emotional eating Brayden has had issues with stress/emotional eating. Emotional eating and  stress are managed with bupropion  SR 150 mg daily, which she finds beneficial. Recent stress due to the passing of her mother-in-law has impacted adherence to her plan, with adherence reported at 50%.  Currently this is moderately controlled. Overall mood is stable. Medication(s): Bupropion  SR 150 mg daily in am No SE.  She requested information concerning counseling through [facility] today.   Plan: Continue and refill Bupropion  SR 150 mg daily in am Referral to Dr. Sharron but may need to be thru [facility] pending changes in Dr. Roque role.   Hypertension Well-controlled with losartan 100 mg daily and amlodipine 10 mg daily. No SE BP Readings from Last 3 Encounters:  11/28/23 134/89  11/02/23 (!) 128/99  10/31/23 112/79   Continue to work on nutrition plan to promote weight loss and improve BP control.  - Continue losartan 100 mg daily. Thru PCP - Continue amlodipine 10 mg daily. Thru PCP  Vitamin D  deficiency Managed with ergocalciferol  50,000 units every other week. No N/V or muscle weakness with Ergocalciferol .  Last vitamin D  Lab Results  Component Value Date   VD25OH 46.3 10/03/2023    - Continue/refill ergocalciferol  50,000 units every other week.  Low vitamin D  levels can be associated with adiposity and may result in leptin resistance and weight gain. Also associated with fatigue.  Currently on vitamin D  supplementation without any adverse effects such as nausea, vomiting or muscle weakness.   Vitals Temp: 98 F (36.7 C) BP: 134/89 Pulse Rate: 75 SpO2: 99 %   Anthropometric Measurements Height: 4' 10 (1.473 m) Weight: 171 lb (77.6 kg) BMI (Calculated): 35.75 Weight at Last Visit: 171 lb Weight Lost Since Last Visit: 0 lb Weight Gained Since Last Visit: 0 lb Starting Weight: 225 lb Total Weight Loss (lbs): 54 lb (24.5 kg) Peak Weight: 232 lb   Body Composition  Body Fat %: 41.5 % Fat Mass (lbs): 71 lbs Muscle Mass (lbs): 95 lbs Total Body Water (lbs):  65.4 lbs Visceral Fat Rating : 12   Other Clinical Data Fasting: yes Labs: no Today's Visit #: 54 Starting Date: 06/12/20     ASSESSMENT AND PLAN:  Diet: Trust is currently in the action stage of change. As such, her goal is to continue with weight loss efforts. She has agreed to Category 1 Plan.  Exercise: Shetara has been instructed to continue exercising as is for weight loss and overall health benefits.   Behavior Modification:  We discussed the following Behavioral Modification Strategies today: increasing lean protein intake, decreasing simple carbohydrates, increasing vegetables, increase H2O intake, increase high fiber foods, no skipping meals, meal planning and cooking strategies, better snacking choices, emotional eating strategies , avoiding temptations, and planning for success. We discussed various medication options to help Aryssa with her weight loss efforts and we both agreed to continue Zepbound  15 mg weekly for medical weight loss as well as prediabetes.  Return in about 4 weeks (around 12/26/2023).SABRA She was informed of the importance of frequent follow up visits to maximize her success with intensive lifestyle modifications for her multiple health conditions.  Attestation  Statements:   Reviewed by clinician on day of visit: allergies, medications, problem list, medical history, surgical history, family history, social history, and previous encounter notes.   Time spent on visit including pre-visit chart review and post-visit care and charting was 41 minutes.    Chaniqua Brisby, PA-C

## 2023-12-26 ENCOUNTER — Encounter (INDEPENDENT_AMBULATORY_CARE_PROVIDER_SITE_OTHER): Payer: Self-pay | Admitting: Physician Assistant

## 2023-12-26 ENCOUNTER — Ambulatory Visit (INDEPENDENT_AMBULATORY_CARE_PROVIDER_SITE_OTHER): Payer: Self-pay | Admitting: Physician Assistant

## 2023-12-26 VITALS — BP 118/83 | HR 84 | Temp 97.4°F | Ht <= 58 in | Wt 169.0 lb

## 2023-12-26 DIAGNOSIS — I10 Essential (primary) hypertension: Secondary | ICD-10-CM

## 2023-12-26 DIAGNOSIS — E559 Vitamin D deficiency, unspecified: Secondary | ICD-10-CM | POA: Diagnosis not present

## 2023-12-26 DIAGNOSIS — R7303 Prediabetes: Secondary | ICD-10-CM | POA: Diagnosis not present

## 2023-12-26 DIAGNOSIS — Z6835 Body mass index (BMI) 35.0-35.9, adult: Secondary | ICD-10-CM | POA: Diagnosis not present

## 2023-12-26 DIAGNOSIS — F439 Reaction to severe stress, unspecified: Secondary | ICD-10-CM

## 2023-12-26 DIAGNOSIS — F5089 Other specified eating disorder: Secondary | ICD-10-CM | POA: Diagnosis not present

## 2023-12-26 DIAGNOSIS — R632 Polyphagia: Secondary | ICD-10-CM

## 2023-12-26 MED ORDER — VITAMIN D (ERGOCALCIFEROL) 1.25 MG (50000 UNIT) PO CAPS: 50000.0000 [IU] | ORAL_CAPSULE | ORAL | 0 refills | Status: DC

## 2023-12-26 MED ORDER — BUPROPION HCL ER (SR) 150 MG PO TB12: 150.0000 mg | ORAL_TABLET | Freq: Every day | ORAL | 0 refills | Status: DC

## 2024-01-23 ENCOUNTER — Encounter (INDEPENDENT_AMBULATORY_CARE_PROVIDER_SITE_OTHER): Payer: Self-pay | Admitting: Physician Assistant

## 2024-01-23 ENCOUNTER — Ambulatory Visit (INDEPENDENT_AMBULATORY_CARE_PROVIDER_SITE_OTHER): Admitting: Physician Assistant

## 2024-01-23 VITALS — BP 129/87 | HR 85 | Temp 98.1°F | Ht <= 58 in | Wt 170.0 lb

## 2024-01-23 DIAGNOSIS — R632 Polyphagia: Secondary | ICD-10-CM

## 2024-01-23 DIAGNOSIS — F439 Reaction to severe stress, unspecified: Secondary | ICD-10-CM | POA: Diagnosis not present

## 2024-01-23 DIAGNOSIS — R7303 Prediabetes: Secondary | ICD-10-CM

## 2024-01-23 DIAGNOSIS — F5089 Other specified eating disorder: Secondary | ICD-10-CM

## 2024-01-23 DIAGNOSIS — E559 Vitamin D deficiency, unspecified: Secondary | ICD-10-CM

## 2024-01-23 DIAGNOSIS — Z6835 Body mass index (BMI) 35.0-35.9, adult: Secondary | ICD-10-CM | POA: Diagnosis not present

## 2024-01-23 DIAGNOSIS — I10 Essential (primary) hypertension: Secondary | ICD-10-CM

## 2024-01-23 MED ORDER — VITAMIN D (ERGOCALCIFEROL) 1.25 MG (50000 UNIT) PO CAPS: 50000.0000 [IU] | ORAL_CAPSULE | ORAL | 0 refills | Status: AC

## 2024-01-23 MED ORDER — BUPROPION HCL ER (SR) 150 MG PO TB12: 150.0000 mg | ORAL_TABLET | Freq: Every day | ORAL | 0 refills | Status: DC

## 2024-01-23 MED ORDER — VITAMIN D (ERGOCALCIFEROL) 1.25 MG (50000 UNIT) PO CAPS: 50000.0000 [IU] | ORAL_CAPSULE | ORAL | 0 refills | Status: DC

## 2024-01-23 MED ORDER — BUPROPION HCL ER (SR) 150 MG PO TB12: 150.0000 mg | ORAL_TABLET | Freq: Every day | ORAL | 0 refills | Status: AC

## 2024-01-23 NOTE — Progress Notes (Signed)
 "  SUBJECTIVE: Discussed the use of AI scribe software for clinical note transcription with the patient, who gave verbal consent to proceed.  Chief Complaint: Obesity  Interim History: She is up 1 lb since her last visit.  Down 55 lbs overall TBW loss of   Mary Odonnell is here to discuss her progress with her obesity treatment plan. She is on the Category 1 Plan and states she is following her eating plan approximately 30 % of the time. She states she is exercising walking 30 minutes 2 times per week. Mary Odonnell is a 59 year old female who presents for follow-up of her obesity treatment plan.  She has a history of obesity, polyphagia, prediabetes, and hypertension. She has been following a medical weight loss plan with [employer] and has lost a total of fifty-five pounds. She is currently taking Zepbound  15 mg once weekly, bupropion  SR 150 mg daily for emotional/stress eating, ergocalciferol  50,000 units every fourteen days for vitamin D  deficiency, and losartan 100 mg daily and amlodipine 10 mg daily for blood pressure management.  She notes a recent weight gain of one pound, attributing it to the holidays. She engages in physical activities such as walking and water aerobics, which she enjoyed during a vacation. She is considering joining a gym to continue water aerobics, evaluating options based on insurance coverage and convenience.  Her dietary habits include eating at home more frequently, preparing meals like stir fry and spare ribs, and maintaining a consistent breakfast routine with spinach and eggs. She faces challenges with meal timing, often having a late lunch which affects her three-meal-a-day plan.  She is concerned about a potential urinary tract infection, noting symptoms of urgency that fluctuate. She has been increasing her water intake and consuming cranberry products to manage the symptoms. No symptoms of the flu.  OBJECTIVE: Visit Diagnoses: Problem List Items Addressed  This Visit     Essential hypertension   Prediabetes   Vitamin D  deficiency   Relevant Medications   Vitamin D , Ergocalciferol , (DRISDOL ) 1.25 MG (50000 UNIT) CAPS capsule   Stress   Relevant Medications   buPROPion  (WELLBUTRIN  SR) 150 MG 12 hr tablet   Morbid obesity (HCC)-START BMI 47.03   Polyphagia - Primary   Other Visit Diagnoses       BMI 35.0-35.9,adult Current BMI 35.7         Obesity with polyphagia Significant weight loss of 55 pounds, nearly 25% of body weight. Currently on Zepbound  15 mg weekly. Insurance coverage for Zepbound  is uncertain due to changes. Alternative medications discussed include phentermine, liraglutide, and Wegovy , with concerns about side effects and efficacy. Emphasis on maintaining current regimen due to positive outcomes. - Continue Zepbound  15 mg weekly if insurance allows. - If Zepbound  is discontinued, consider Wegovy  or other alternative GLP medications, as this has greatly benefited all metabolic parameters.  - Encouraged finding a consistent exercise routine, such as water aerobics. - Encouraged cooking at home to maintain weight loss.  Essential hypertension Blood pressure managed with losartan 100 mg daily and amlodipine 10 mg daily. Reports no SE BP Readings from Last 3 Encounters:  01/23/24 129/87  12/26/23 118/83  11/28/23 134/89   Lab Results  Component Value Date   NA 142 10/03/2023   CL 107 (H) 10/03/2023   K 4.6 10/03/2023   CO2 19 (L) 10/03/2023   BUN 13 10/03/2023   CREATININE 0.80 10/03/2023   EGFR 86 10/03/2023   CALCIUM 9.1 10/03/2023   ALBUMIN 3.9 10/03/2023  GLUCOSE 76 10/03/2023   Plan Continue to work on nutrition plan to promote weight loss and improve BP control.  - Continue losartan 100 mg daily. - Continue amlodipine 10 mg daily.  Prediabetes Last A1c was 5.5- at goal. Insulin  8.1- nearly at goal  Medication(s): Zepbound  15 mg SQ weekly Polyphagia:No Lab Results  Component Value Date   HGBA1C 5.5  09/06/2023   HGBA1C 5.5 02/24/2023   HGBA1C 5.7 11/22/2022   HGBA1C 5.5 08/05/2022   HGBA1C 5.8 05/26/2022   Lab Results  Component Value Date   INSULIN  8.1 10/03/2023   INSULIN  17.2 02/24/2023   INSULIN  11.4 12/14/2021   INSULIN  16.5 06/18/2021   INSULIN  13.9 03/05/2021    Plan: Continue Zepbound  15 mg SQ weekly per [employer] provider Continue working on nutrition plan to decrease simple carbohydrates, increase lean proteins and exercise to promote weight loss, improve glycemic control and prevent progression to Type 2 diabetes.  May need to consider other medications if insurance does not cover.   Vitamin D  deficiency Managed with ergocalciferol  50,000 units every 14 days.Reports no N/V or muscle weakness with Ergocalciferol  . Last vitamin D  Lab Results  Component Value Date   VD25OH 46.3 10/03/2023  Low vitamin D  levels can be associated with adiposity and may result in leptin resistance and weight gain. Also associated with fatigue.  Currently on vitamin D  supplementation without any adverse effects such as nausea, vomiting or muscle weakness.  - Continue/refill ergocalciferol  50,000 units every 14 days x 90 days  Emotional eating and stress eating Managed with bupropion  SR 150 mg daily. Reports no SE. - Continue/refill bupropion  SR 150 mg daily x 90 days. Meds ordered this encounter  Medications   DISCONTD: buPROPion  (WELLBUTRIN  SR) 150 MG 12 hr tablet    Sig: Take 1 tablet (150 mg total) by mouth daily.    Dispense:  30 tablet    Refill:  0   DISCONTD: Vitamin D , Ergocalciferol , (DRISDOL ) 1.25 MG (50000 UNIT) CAPS capsule    Sig: Take 1 capsule (50,000 Units total) by mouth every 14 (fourteen) days.    Dispense:  2 capsule    Refill:  0   buPROPion  (WELLBUTRIN  SR) 150 MG 12 hr tablet    Sig: Take 1 tablet (150 mg total) by mouth daily.    Dispense:  90 tablet    Refill:  0   Vitamin D , Ergocalciferol , (DRISDOL ) 1.25 MG (50000 UNIT) CAPS capsule    Sig: Take 1 capsule  (50,000 Units total) by mouth every 14 (fourteen) days.    Dispense:  6 capsule    Refill:  0   ] Vitals Temp: 98.1 F (36.7 C) BP: 129/87 Pulse Rate: 85 SpO2: 99 %   Anthropometric Measurements Height: 4' 10 (1.473 m) Weight: 170 lb (77.1 kg) BMI (Calculated): 35.54 Weight at Last Visit: 169 lb Weight Lost Since Last Visit: 0 Weight Gained Since Last Visit: 1lb Starting Weight: 225 lb Total Weight Loss (lbs): 55 lb (24.9 kg) Peak Weight: 232 lb   Body Composition  Body Fat %: 42 % Fat Mass (lbs): 71.8 lbs Muscle Mass (lbs): 94 lbs Total Body Water (lbs): 65.6 lbs Visceral Fat Rating : 12   Other Clinical Data Fasting: Yes Labs: No Today's Visit #: 56 Starting Date: 06/12/20     ASSESSMENT AND PLAN:  Diet: Tobin is currently in the action stage of change. As such, her goal is to continue with weight loss efforts. She has agreed to Category 1 Plan.  Exercise: Keisa has been instructed to work up to a goal of 150 minutes of combined cardio and strengthening exercise per week for weight loss and overall health benefits.   Behavior Modification:  We discussed the following Behavioral Modification Strategies today: increasing lean protein intake, decreasing simple carbohydrates, increasing vegetables, increase H2O intake, increase high fiber foods, meal planning and cooking strategies, emotional eating strategies , avoiding temptations, and planning for success. We discussed various medication options to help Matrice with her weight loss efforts and we both agreed to continue Zepbound  15 mg weekly per [facility] provider recommendations and continue bupropion  for emotional eating/stress eating.  Return in about 4 weeks (around 02/20/2024).SABRA She was informed of the importance of frequent follow up visits to maximize her success with intensive lifestyle modifications for her multiple health conditions.  Attestation Statements:   Reviewed by clinician on day of visit:  allergies, medications, problem list, medical history, surgical history, family history, social history, and previous encounter notes.   Time spent on visit including pre-visit chart review and post-visit care and charting was 29 minutes.    Zion Ta, PA-C  "

## 2024-02-04 ENCOUNTER — Encounter (INDEPENDENT_AMBULATORY_CARE_PROVIDER_SITE_OTHER): Payer: Self-pay | Admitting: Physician Assistant

## 2024-02-22 ENCOUNTER — Ambulatory Visit (INDEPENDENT_AMBULATORY_CARE_PROVIDER_SITE_OTHER): Admitting: Physician Assistant

## 2024-03-19 ENCOUNTER — Ambulatory Visit (INDEPENDENT_AMBULATORY_CARE_PROVIDER_SITE_OTHER): Admitting: Physician Assistant

## 2024-11-05 ENCOUNTER — Ambulatory Visit: Admitting: Dermatology
